# Patient Record
Sex: Male | Born: 1965 | Race: White | Hispanic: No | Marital: Single | Smoking: Former smoker
Health system: Southern US, Community
[De-identification: ages and names within clinical notes are randomized; demographics above are authoritative.]

## PROBLEM LIST (undated history)

## (undated) DIAGNOSIS — E785 Hyperlipidemia, unspecified: Secondary | ICD-10-CM

## (undated) DIAGNOSIS — J449 Chronic obstructive pulmonary disease, unspecified: Secondary | ICD-10-CM

## (undated) DIAGNOSIS — G4730 Sleep apnea, unspecified: Secondary | ICD-10-CM

## (undated) DIAGNOSIS — I219 Acute myocardial infarction, unspecified: Secondary | ICD-10-CM

## (undated) DIAGNOSIS — Z720 Tobacco use: Secondary | ICD-10-CM

## (undated) DIAGNOSIS — I509 Heart failure, unspecified: Secondary | ICD-10-CM

## (undated) DIAGNOSIS — I2510 Atherosclerotic heart disease of native coronary artery without angina pectoris: Secondary | ICD-10-CM

## (undated) HISTORY — PX: ESOPHAGOGASTRODUODENOSCOPY (EGD) WITH ESOPHAGEAL DILATION: SHX5812

## (undated) HISTORY — DX: Hyperlipidemia, unspecified: E78.5

## (undated) HISTORY — DX: Sleep apnea, unspecified: G47.30

## (undated) HISTORY — DX: Chronic obstructive pulmonary disease, unspecified: J44.9

## (undated) HISTORY — DX: Heart failure, unspecified: I50.9

## (undated) HISTORY — DX: Atherosclerotic heart disease of native coronary artery without angina pectoris: I25.10

---

## 2013-04-15 ENCOUNTER — Emergency Department (HOSPITAL_COMMUNITY)
Admission: EM | Admit: 2013-04-15 | Discharge: 2013-04-16 | Disposition: A | Discharge status: Home / Self Care | Payer: Self-pay | Attending: Emergency Medicine | Admitting: Emergency Medicine

## 2013-04-15 DIAGNOSIS — F1010 Alcohol abuse, uncomplicated: Secondary | ICD-10-CM | POA: Insufficient documentation
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
## 2013-04-16 ENCOUNTER — Emergency Department (HOSPITAL_COMMUNITY): Payer: Self-pay

## 2013-04-16 LAB — CBC WITH DIFFERENTIAL/PLATELET
Basophils Absolute: 0 10*3/uL (ref 0.0–0.1)
Eosinophils Absolute: 0 10*3/uL (ref 0.0–0.7)
Eosinophils Relative: 0 % (ref 0–5)
Lymphocytes Relative: 17 % (ref 12–46)
Lymphs Abs: 1.9 10*3/uL (ref 0.7–4.0)
MCV: 88.2 fL (ref 78.0–100.0)
Neutrophils Relative %: 79 % — ABNORMAL HIGH (ref 43–77)
Platelets: 237 10*3/uL (ref 150–400)
RBC: 4.84 MIL/uL (ref 4.22–5.81)
RDW: 13 % (ref 11.5–15.5)
WBC: 10.8 10*3/uL — ABNORMAL HIGH (ref 4.0–10.5)

## 2013-04-16 LAB — RAPID URINE DRUG SCREEN, HOSP PERFORMED
Benzodiazepines: NOT DETECTED
Cocaine: NOT DETECTED
Opiates: NOT DETECTED

## 2013-04-16 LAB — URINALYSIS, ROUTINE W REFLEX MICROSCOPIC
Bilirubin Urine: NEGATIVE
Ketones, ur: NEGATIVE mg/dL
Specific Gravity, Urine: >1.03 — ABNORMAL HIGH (ref 1.005–1.030)
pH: 5.5 (ref 5.0–8.0)

## 2013-04-16 LAB — URINE MICROSCOPIC-ADD ON

## 2013-04-16 LAB — BASIC METABOLIC PANEL
CO2: 24 mEq/L (ref 19–32)
Calcium: 8.9 mg/dL (ref 8.4–10.5)
GFR calc non Af Amer: >90 mL/min (ref >90)
Glucose, Bld: 132 mg/dL — ABNORMAL HIGH (ref 70–99)
Potassium: 3.4 mEq/L — ABNORMAL LOW (ref 3.5–5.1)
Sodium: 134 mEq/L — ABNORMAL LOW (ref 135–145)

## 2013-04-16 LAB — ETHANOL: Alcohol, Ethyl (B): 149 mg/dL — ABNORMAL HIGH (ref 0–11)

## 2013-04-16 MED ORDER — SODIUM CHLORIDE 0.9 % IV BOLUS (SEPSIS)
1000.0000 mL | Freq: Once | INTRAVENOUS | Status: AC
  Administered 2013-04-16: 1000 mL via INTRAVENOUS
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
## 2016-10-18 DIAGNOSIS — I219 Acute myocardial infarction, unspecified: Secondary | ICD-10-CM

## 2016-10-18 DIAGNOSIS — I639 Cerebral infarction, unspecified: Secondary | ICD-10-CM

## 2016-10-18 HISTORY — DX: Acute myocardial infarction, unspecified: I21.9

## 2016-10-18 HISTORY — DX: Cerebral infarction, unspecified: I63.9

## 2017-02-03 ENCOUNTER — Inpatient Hospital Stay (HOSPITAL_COMMUNITY)
Admission: EM | Admit: 2017-02-03 | Discharge: 2017-02-05 | DRG: 246 | Disposition: A | Discharge status: Home / Self Care | Payer: Self-pay | Attending: Internal Medicine | Admitting: Internal Medicine

## 2017-02-03 ENCOUNTER — Emergency Department (HOSPITAL_COMMUNITY): Payer: Self-pay

## 2017-02-03 ENCOUNTER — Encounter (HOSPITAL_COMMUNITY): Payer: Self-pay | Admitting: *Deleted

## 2017-02-03 DIAGNOSIS — Z880 Allergy status to penicillin: Secondary | ICD-10-CM

## 2017-02-03 DIAGNOSIS — Z720 Tobacco use: Secondary | ICD-10-CM | POA: Diagnosis present

## 2017-02-03 DIAGNOSIS — J441 Chronic obstructive pulmonary disease with (acute) exacerbation: Secondary | ICD-10-CM | POA: Diagnosis present

## 2017-02-03 DIAGNOSIS — F17210 Nicotine dependence, cigarettes, uncomplicated: Secondary | ICD-10-CM | POA: Diagnosis present

## 2017-02-03 DIAGNOSIS — R0789 Other chest pain: Secondary | ICD-10-CM

## 2017-02-03 DIAGNOSIS — E662 Morbid (severe) obesity with alveolar hypoventilation: Secondary | ICD-10-CM | POA: Diagnosis present

## 2017-02-03 DIAGNOSIS — J9621 Acute and chronic respiratory failure with hypoxia: Secondary | ICD-10-CM

## 2017-02-03 DIAGNOSIS — R079 Chest pain, unspecified: Secondary | ICD-10-CM | POA: Diagnosis present

## 2017-02-03 DIAGNOSIS — Z955 Presence of coronary angioplasty implant and graft: Secondary | ICD-10-CM

## 2017-02-03 DIAGNOSIS — R0902 Hypoxemia: Secondary | ICD-10-CM | POA: Diagnosis present

## 2017-02-03 DIAGNOSIS — I214 Non-ST elevation (NSTEMI) myocardial infarction: Principal | ICD-10-CM | POA: Diagnosis present

## 2017-02-03 DIAGNOSIS — I10 Essential (primary) hypertension: Secondary | ICD-10-CM | POA: Diagnosis present

## 2017-02-03 DIAGNOSIS — Z8249 Family history of ischemic heart disease and other diseases of the circulatory system: Secondary | ICD-10-CM

## 2017-02-03 DIAGNOSIS — Z6841 Body Mass Index (BMI) 40.0 and over, adult: Secondary | ICD-10-CM

## 2017-02-03 DIAGNOSIS — J9601 Acute respiratory failure with hypoxia: Secondary | ICD-10-CM | POA: Diagnosis present

## 2017-02-03 DIAGNOSIS — D72829 Elevated white blood cell count, unspecified: Secondary | ICD-10-CM | POA: Diagnosis present

## 2017-02-03 DIAGNOSIS — I252 Old myocardial infarction: Secondary | ICD-10-CM

## 2017-02-03 DIAGNOSIS — I2510 Atherosclerotic heart disease of native coronary artery without angina pectoris: Secondary | ICD-10-CM | POA: Diagnosis present

## 2017-02-03 DIAGNOSIS — E669 Obesity, unspecified: Secondary | ICD-10-CM

## 2017-02-03 HISTORY — DX: Tobacco use: Z72.0

## 2017-02-03 LAB — CBC
HCT: 48.8 % (ref 39.0–52.0)
HEMOGLOBIN: 16.2 g/dL (ref 13.0–17.0)
MCH: 29.8 pg (ref 26.0–34.0)
MCHC: 33.2 g/dL (ref 30.0–36.0)
MCV: 89.9 fL (ref 78.0–100.0)
PLATELETS: 270 10*3/uL (ref 150–400)
RBC: 5.43 MIL/uL (ref 4.22–5.81)
RDW: 13 % (ref 11.5–15.5)
WBC: 15 10*3/uL — AB (ref 4.0–10.5)

## 2017-02-03 LAB — APTT: APTT: 187 s — AB (ref 24–36)

## 2017-02-03 LAB — BASIC METABOLIC PANEL
Anion gap: 9 (ref 5–15)
BUN: 10 mg/dL (ref 6–20)
CALCIUM: 9.8 mg/dL (ref 8.9–10.3)
CO2: 27 mmol/L (ref 22–32)
CREATININE: 1.18 mg/dL (ref 0.61–1.24)
Chloride: 102 mmol/L (ref 101–111)
GFR calc Af Amer: >60 mL/min (ref >60)
GLUCOSE: 127 mg/dL — AB (ref 65–99)
POTASSIUM: 4.1 mmol/L (ref 3.5–5.1)
Sodium: 138 mmol/L (ref 135–145)

## 2017-02-03 LAB — HEPARIN LEVEL (UNFRACTIONATED): HEPARIN UNFRACTIONATED: 0.17 [IU]/mL — AB (ref 0.30–0.70)

## 2017-02-03 LAB — D-DIMER, QUANTITATIVE (NOT AT ARMC): D DIMER QUANT: 0.3 ug{FEU}/mL (ref 0.00–0.50)

## 2017-02-03 LAB — TROPONIN I
TROPONIN I: 0.05 ng/mL — AB (ref <0.03)
TROPONIN I: 0.33 ng/mL — AB (ref <0.03)
TROPONIN I: 0.74 ng/mL — AB (ref <0.03)

## 2017-02-03 MED ORDER — MORPHINE SULFATE (PF) 2 MG/ML IV SOLN: 2.0000 mg | INTRAVENOUS | Status: DC | PRN

## 2017-02-03 MED ORDER — ASPIRIN EC 325 MG PO TBEC
325.0000 mg | DELAYED_RELEASE_TABLET | Freq: Every day | ORAL | Status: DC
  Administered 2017-02-04: 325 mg via ORAL
  Filled 2017-02-03: qty 1

## 2017-02-03 MED ORDER — NITROGLYCERIN IN D5W 200-5 MCG/ML-% IV SOLN
5.0000 ug/min | Freq: Once | INTRAVENOUS | Status: AC
  Administered 2017-02-03: 5 ug/min via INTRAVENOUS
  Filled 2017-02-03: qty 250

## 2017-02-03 MED ORDER — HEPARIN BOLUS VIA INFUSION
4000.0000 [IU] | Freq: Once | INTRAVENOUS | Status: AC
  Administered 2017-02-03: 4000 [IU] via INTRAVENOUS

## 2017-02-03 MED ORDER — LEVOFLOXACIN IN D5W 500 MG/100ML IV SOLN
500.0000 mg | INTRAVENOUS | Status: DC
  Administered 2017-02-03 – 2017-02-04 (×2): 500 mg via INTRAVENOUS
  Filled 2017-02-03 (×3): qty 100

## 2017-02-03 MED ORDER — METHYLPREDNISOLONE SODIUM SUCC 125 MG IJ SOLR
125.0000 mg | Freq: Once | INTRAMUSCULAR | Status: AC
  Administered 2017-02-03: 125 mg via INTRAVENOUS
  Filled 2017-02-03: qty 2

## 2017-02-03 MED ORDER — IPRATROPIUM-ALBUTEROL 0.5-2.5 (3) MG/3ML IN SOLN: 3.0000 mL | RESPIRATORY_TRACT | Status: DC | PRN

## 2017-02-03 MED ORDER — IPRATROPIUM-ALBUTEROL 0.5-2.5 (3) MG/3ML IN SOLN
3.0000 mL | Freq: Once | RESPIRATORY_TRACT | Status: AC
  Administered 2017-02-03: 3 mL via RESPIRATORY_TRACT
  Filled 2017-02-03: qty 3

## 2017-02-03 MED ORDER — HEPARIN BOLUS VIA INFUSION
3000.0000 [IU] | Freq: Once | INTRAVENOUS | Status: AC
  Administered 2017-02-03: 3000 [IU] via INTRAVENOUS

## 2017-02-03 MED ORDER — GI COCKTAIL ~~LOC~~: 30.0000 mL | Freq: Four times a day (QID) | ORAL | Status: DC | PRN

## 2017-02-03 MED ORDER — NITROGLYCERIN 0.4 MG SL SUBL
0.4000 mg | SUBLINGUAL_TABLET | Freq: Once | SUBLINGUAL | Status: AC
  Administered 2017-02-03: 0.4 mg via SUBLINGUAL
  Filled 2017-02-03: qty 1

## 2017-02-03 MED ORDER — METHYLPREDNISOLONE SODIUM SUCC 125 MG IJ SOLR
60.0000 mg | Freq: Three times a day (TID) | INTRAMUSCULAR | Status: DC
  Administered 2017-02-04 (×2): 60 mg via INTRAVENOUS
  Filled 2017-02-03 (×2): qty 2

## 2017-02-03 MED ORDER — SODIUM CHLORIDE 0.9 % IV SOLN
INTRAVENOUS | Status: DC
  Administered 2017-02-04: 01:00:00 via INTRAVENOUS

## 2017-02-03 MED ORDER — HEPARIN (PORCINE) IN NACL 100-0.45 UNIT/ML-% IJ SOLN
2000.0000 [IU]/h | INTRAMUSCULAR | Status: DC
Start: 2017-02-03 — End: 2017-02-04
  Administered 2017-02-03: 1550 [IU]/h via INTRAVENOUS
  Administered 2017-02-04: 1850 [IU]/h via INTRAVENOUS
  Filled 2017-02-03: qty 250

## 2017-02-03 MED ORDER — ASPIRIN 81 MG PO CHEW
324.0000 mg | CHEWABLE_TABLET | Freq: Once | ORAL | Status: AC
Start: 2017-02-03 — End: 2017-02-03
  Administered 2017-02-03: 324 mg via ORAL
  Filled 2017-02-03: qty 4

## 2017-02-03 MED ORDER — ONDANSETRON HCL 4 MG/2ML IJ SOLN: 4.0000 mg | Freq: Four times a day (QID) | INTRAMUSCULAR | Status: DC | PRN

## 2017-02-03 MED ORDER — HEPARIN (PORCINE) IN NACL 100-0.45 UNIT/ML-% IJ SOLN
INTRAMUSCULAR | Status: AC
  Filled 2017-02-03: qty 250

## 2017-02-03 MED ORDER — GUAIFENESIN ER 600 MG PO TB12
600.0000 mg | ORAL_TABLET | Freq: Two times a day (BID) | ORAL | Status: DC
Start: 2017-02-03 — End: 2017-02-05
  Administered 2017-02-04 – 2017-02-05 (×4): 600 mg via ORAL
  Filled 2017-02-03 (×4): qty 1

## 2017-02-03 MED ORDER — NICOTINE 21 MG/24HR TD PT24
21.0000 mg | MEDICATED_PATCH | Freq: Every day | TRANSDERMAL | Status: DC | PRN
  Administered 2017-02-04: 21 mg via TRANSDERMAL
  Filled 2017-02-03: qty 1

## 2017-02-03 MED ORDER — ACETAMINOPHEN 325 MG PO TABS: 650.0000 mg | ORAL_TABLET | ORAL | Status: DC | PRN

## 2017-02-03 NOTE — ED Notes (Signed)
Dr. Katrinka Blazing made aware of pt's trop 0.33 at 1916, waiting to hear back from cardiologist that secretary paged

## 2017-02-03 NOTE — ED Notes (Signed)
Returned from xray

## 2017-02-03 NOTE — ED Triage Notes (Signed)
Cp in center of chest started around 1000 today, pt did report he had a pain in center of chest yesterday that lasted for a few seconds then went away.  The pain today is constant

## 2017-02-03 NOTE — Progress Notes (Signed)
ANTICOAGULATION CONSULT NOTE - Initial Consult  Pharmacy Consult for HEPARIN Indication: chest pain/ACS  Allergies  Allergen Reactions  . Penicillins Hives   Patient Measurements: Height: 6' (182.9 cm) Weight: (!) 305 lb (138.3 kg) IBW/kg (Calculated) : 77.6 HEPARIN DW (KG): 109.4  Vital Signs: Temp: 98.4 F (36.9 C) (04/19 1430) Temp Source: Oral (04/19 1430) BP: 134/84 (04/19 1500) Pulse Rate: 73 (04/19 1520)  Labs:  Recent Labs  02/03/17 1440  HGB 16.2  HCT 48.8  PLT 270  CREATININE 1.18  TROPONINI 0.05*   Estimated Creatinine Clearance: 107.9 mL/min (by C-G formula based on SCr of 1.18 mg/dL).  Medical History: History reviewed. No pertinent past medical history.  Medications:   (Not in a hospital admission)  Assessment: 50yo obese male c/o chest pain.  Asked to initiate Heparin for ACS.  Goal of Therapy:  Heparin level 0.3-0.7 units/ml Monitor platelets by anticoagulation protocol: Yes   Plan:   Heparin 4000 units IV now x 1  Heparin infusion at 1550 units/hr  Heparin level in 6-8 hrs then daily  CBC daily while on Heparin   Valrie Hart A 02/03/2017,3:56 PM

## 2017-02-03 NOTE — ED Notes (Signed)
Patient transported to X-ray 

## 2017-02-03 NOTE — ED Notes (Signed)
Pt's RA sats 86-89% while laying in bed at rest.  O2 applied via [place] at 2 L/M-sats increased to 94%

## 2017-02-03 NOTE — ED Notes (Signed)
CRITICAL VALUE ALERT  Critical value received:  Trop 0.05  Date of notification:  02/03/17  Time of notification:  1541  Critical value read back:Yes.    Nurse who received alert:  B. Garner Nash, RN  MD notified (1st page):  Rubin Payor  Time of first page:  [phone]

## 2017-02-03 NOTE — H&P (Addendum)
History and Physical    Chad Gillespie ZOX:[id number] DOB: 1965/12/24 DOA: 02/03/2017  Referring MD/NP/PA: Dr. Rubin Payor PCP: No PCP Per Patient  Patient coming from: Home  Chief Complaint: Chest pain  HPI: Chad Gillespie is a 51 y.o. male with medical history significant of tobacco abuse; who presents with complaints of chest pain since yesterday. Patient reports all hospitalization yesterday afternoon substernal chest pain that was sharp with radiation across his chest and into his back some. Associated symptoms included diaphoresis. At that time symptoms self resolved within less than 30 minutes. However, while at work today patient reported return of substernal chest pain. Symptoms were more severe and rates them a 8 ou of 10 on the pain scale at its worst. Symptoms seemed to wax and wane in intensity and have persisted multiple hours. Associated symptoms include shortness of breath, diaphoresis, productive cough (chronic), and intermittent leg swelling. He did not try anything for the symptoms. Denies any recent travel, prolonged immobilization, calf pain, abdominal pain, nausea, vomiting, orthopnea, fever, or chills. He does admit to smoking approximately up to 2 packs cigarettes per day on average. Patient is also needed by her primary care physician.   ED Course: Upon admission into the emergency department patient was seen to be afebrile, pulse 64-76, respirations 10-20, blood pressures 100/62-169/93, O2 saturations as low as 87% on room air. Labs revealed WBC 15, troponin 0.05, d-dimer 0.3, and other values relatively within normal limits. Chest x-ray was noted to be within normal limits. EKG showed no significant ischemic changes. Cardiology was consulted to evaluate the patient in the ED and he was placed on a heparin drip.   Review of Systems: As per HPI otherwise 10 point review of systems negative.   History reviewed. No pertinent past medical history.  Past Surgical History:    Procedure Laterality Date  . ESOPHAGOGASTRODUODENOSCOPY (EGD) WITH ESOPHAGEAL DILATION       reports that he has been smoking Cigarettes.  He has been smoking about 2.00 packs per day. He has never used smokeless tobacco. He reports that he drinks alcohol. He reports that he does not use drugs.  Allergies  Allergen Reactions  . Penicillins Hives    Has patient had a PCN reaction causing immediate rash, facial/tongue/throat swelling, SOB or lightheadedness with hypotension: Yes Has patient had a PCN reaction causing severe rash involving mucus membranes or skin necrosis: No Has patient had a PCN reaction that required hospitalization No Has patient had a PCN reaction occurring within the last 10 years: No If all of the above answers are "NO", then may proceed with Cephalosporin use.     No family history on file.  Prior to Admission medications   Not on File    Physical Exam: Constitutional: Obese male in moderate discomfort, but able to follow commands  Vitals:   02/03/17 1620 02/03/17 1626 02/03/17 1628 02/03/17 1630  BP: 118/76   128/72  Pulse: 71 73 70 73  Resp: 10 19 14 17   Temp:      TempSrc:      SpO2: 99% 99% 97% 94%  Weight:      Height:       Eyes: PERRL, lids and conjunctivae normal ENMT: Mucous membranes are moist. Posterior pharynx clear of any exudate or lesions.Normal dentition.  Neck: normal, supple, no masses, no thyromegaly Respiratory: Expiratory wheezes appreciated throughout both lung fields with decreased aeration. Patient on 2 L nasal cannula oxygen maintaining O2 saturation.  Normal respiratory effort. No accessory muscle  use.  Cardiovascular: Regular rate and rhythm, no murmurs / rubs / gallops. +1 b/l pitting lower extremity edema. 2+ pedal pulses. No carotid bruits.  Abdomen: no tenderness, no masses palpated. No hepatosplenomegaly. Bowel sounds positive.  Musculoskeletal: no clubbing / cyanosis. No joint deformity upper and lower extremities.  Good ROM, no contractures. Normal muscle tone.  Skin: no rashes, lesions, ulcers. No induration Neurologic: CN 2-12 grossly intact. Sensation intact, DTR normal. Strength 5/5 in all 4.  Psychiatric: Normal judgment and insight. Alert and oriented x 3. Normal mood.     Labs on Admission: I have personally reviewed following labs and imaging studies  CBC:  Recent Labs Lab 02/03/17 1440  WBC 15.0*  HGB 16.2  HCT 48.8  MCV 89.9  PLT 270   Basic Metabolic Panel:  Recent Labs Lab 02/03/17 1440  NA 138  K 4.1  CL 102  CO2 27  GLUCOSE 127*  BUN 10  CREATININE 1.18  CALCIUM 9.8   GFR: Estimated Creatinine Clearance: 107.9 mL/min (by C-G formula based on SCr of 1.18 mg/dL). Liver Function Tests: No results for input(s): AST, ALT, ALKPHOS, BILITOT, PROT, ALBUMIN in the last 168 hours. No results for input(s): LIPASE, AMYLASE in the last 168 hours. No results for input(s): AMMONIA in the last 168 hours. Coagulation Profile: No results for input(s): INR, PROTIME in the last 168 hours. Cardiac Enzymes:  Recent Labs Lab 02/03/17 1440  TROPONINI 0.05*   BNP (last 3 results) No results for input(s): PROBNP in the last 8760 hours. HbA1C: No results for input(s): HGBA1C in the last 72 hours. CBG: No results for input(s): GLUCAP in the last 168 hours. Lipid Profile: No results for input(s): CHOL, HDL, LDLCALC, TRIG, CHOLHDL, LDLDIRECT in the last 72 hours. Thyroid Function Tests: No results for input(s): TSH, T4TOTAL, FREET4, T3FREE, THYROIDAB in the last 72 hours. Anemia Panel: No results for input(s): VITAMINB12, FOLATE, FERRITIN, TIBC, IRON, RETICCTPCT in the last 72 hours. Urine analysis: No results found for: COLORURINE, APPEARANCEUR, LABSPEC, PHURINE, GLUCOSEU, HGBUR, BILIRUBINUR, KETONESUR, PROTEINUR, UROBILINOGEN, NITRITE, LEUKOCYTESUR Sepsis Labs: No results found for this or any previous visit (from the past 240 hour(s)).   Radiological Exams on Admission: Dg  Chest 2 View  Result Date: 02/03/2017 CLINICAL DATA:  Chest pain. EXAM: CHEST  2 VIEW COMPARISON:  None. FINDINGS: The heart size and mediastinal contours are within normal limits. Both lungs are clear. No pneumothorax or pleural effusion is noted. The visualized skeletal structures are unremarkable. IMPRESSION: No active cardiopulmonary disease. Electronically Signed   By: Lupita Raider, M.D.   On: 02/03/2017 15:31    EKG: Independently reviewed. Sinus rhythm   Assessment/Plan Chest pain with possible NSTEMI: Acute. Patient presents with reports of chest pain since yesterday. heart scores= to at least 4  Initial EKG without any significant ST elevations, but initial troponin 0.05. Suspect this is cardiac in nature, but could also be secondary to demand.  - Admit to stepdown bed - Heparin drip per pharmacy  - Trend cardiac enzymes - Check repaeat EKG in a.m. - NPO after midnight - Nitro / Morphine IV prn pain - ASA - check UDS and lipid panel - check Echo in a.m  - Appreciate Dr. Wyline Mood of Cardiology, follow-up for further recommendations   Leukocytosis: WBC elevated at 15. Could be reactive, but question possibility of underlying infection. - f/u repeat cbc in a.m.  Suspected COPD exacerbation: Acute. Patient without formal diagnosis of COPD. The patient reports more of a chronic  productive cough with 2ppd smoking history. Chest x-ray otherwise noted to be clear. - Duonebs prn SOB/ wheezing   - solumedrol IV q8 hours - Levaquin IV  Acute respiratory failure with Hypoxia: Oxygen saturations noted with low as 87% on room air.  - Continuous pulse oximetry with nasal cannula oxygen to keep O2 sats greater than 92%  Tobacco abuse - Counseled on need of cessation of tobacco - Nicotine patch offered  Obesity  DVT prophylaxis: Heparin drip  Code Status: Full Family Communication: Discussed plan of care with the patient's family present at bedside. Disposition Plan: likely discharge  home in 1-2 days  Consults called: Cardiology  Admission status: Observation  Clydie Braun MD [facility] Pager [phone]  If 7PM-7AM, please contact night-coverage [email or web address] Password [facility]  02/03/2017, 5:04 PM

## 2017-02-03 NOTE — Consult Note (Addendum)
Primary cardiologist: n/a Consulting cardiologist: Dr Dina Rich MD Requesting physician: Dr Benjiman Core Indication: chest pain  Clinical Summary Chad Gillespie is a 51 y.o.male no known PMH however has not seen doctors in several years presents with chest pain. Initial episode yesterday afternoon while watching tv. 5/10 sharp pain midchest, got diaphroetic. Lasted about 20 minutes then resolved. Repeat episode today around 10AM while at work. Similar sharp pain, associated diaphoresis and SOB. Constant pain x 6 hours, with some variability in severity. Currently pain free.    ER vitals: p 64 bp 169/93  87% RA K 4.1 Cr 1.18 WBC 15 Hgb 16.2 Plt 270 Trop 0.05 EKG SR, no ischemic changes CXR no acute process   Allergies  Allergen Reactions  . Penicillins Hives    Medications Scheduled Medications:    Infusions:    PRN Medications:     History reviewed. No pertinent past medical history.  Past Surgical History:  Procedure Laterality Date  . ESOPHAGOGASTRODUODENOSCOPY (EGD) WITH ESOPHAGEAL DILATION      No family history on file.  Social History [name] reports that he has been smoking Cigarettes.  He has been smoking about 2.00 packs per day. He has never used smokeless tobacco. [name] reports that he drinks alcohol.  Review of Systems CONSTITUTIONAL: No weight loss, fever, chills, weakness or fatigue.  HEENT: Eyes: No visual loss, blurred vision, double vision or yellow sclerae. No hearing loss, sneezing, congestion, runny nose or sore throat.  SKIN: No rash or itching.  CARDIOVASCULAR: per HPI RESPIRATORY: per HPI GASTROINTESTINAL: No anorexia, nausea, vomiting or diarrhea. No abdominal pain or blood.  GENITOURINARY: no polyuria, no dysuria NEUROLOGICAL: No headache, dizziness, syncope, paralysis, ataxia, numbness or tingling in the extremities. No change in bowel or bladder control.  MUSCULOSKELETAL: No muscle, back pain, joint pain or  stiffness.  HEMATOLOGIC: No anemia, bleeding or bruising.  LYMPHATICS: No enlarged nodes. No history of splenectomy.  PSYCHIATRIC: No history of depression or anxiety.      Physical Examination Blood pressure 134/84, pulse 73, temperature 98.4 F (36.9 C), temperature source Oral, resp. rate 13, height 6' (1.829 m), weight (!) 305 lb (138.3 kg), SpO2 92 %. No intake or output data in the 24 hours ending 02/03/17 1557  HEENT: sclera clear, throat clear  Cardiovascular: RRR, no m/r/g, no jvd  Respiratory: bilateral wheezing  GI: abdomen soft, NT, ND  MSK: trace bilateral LE edema  Neuro: no focal deficits  Psych: appropriate affect   Lab Results  Basic Metabolic Panel:  Recent Labs Lab 02/03/17 1440  NA 138  K 4.1  CL 102  CO2 27  GLUCOSE 127*  BUN 10  CREATININE 1.18  CALCIUM 9.8    Liver Function Tests: No results for input(s): AST, ALT, ALKPHOS, BILITOT, PROT, ALBUMIN in the last 168 hours.  CBC:  Recent Labs Lab 02/03/17 1440  WBC 15.0*  HGB 16.2  HCT 48.8  MCV 89.9  PLT 270    Cardiac Enzymes:  Recent Labs Lab 02/03/17 1440  TROPONINI 0.05*    BNP: Invalid input(s): POCBNP     Impression/Recommendations 1. Chest pain - symptoms not typical for angina. Primarily in there 6 hours constant duration. If prolonged ischemia would anticipate higher troponin as well. EKG shows no acute changes. - with his mild troponin, chest pain, and hypoxia recommend D-dimer. If positive would obtain CT PE - from cardiac standpoint cycle enzymes and EKG overnight, echo tomorrow AM. NPO at midnight - continue heparin  drip ,ASA - check FLP, HgbA1c with AM labs.   2. Wheezing - history of tobacco abuse. Significant wheezing on exam. Probable COPD exacerbation though patient has not had formal diagnosis in the past.  - likely will require nebs, perhaps course of steroids per primary team.   Dina Rich, M.D.

## 2017-02-03 NOTE — Progress Notes (Signed)
ANTICOAGULATION CONSULT NOTE - Initial Consult  Pharmacy Consult for HEPARIN Indication: chest pain/ACS  Allergies  Allergen Reactions  . Penicillins Hives    Has patient had a PCN reaction causing immediate rash, facial/tongue/throat swelling, SOB or lightheadedness with hypotension: Yes Has patient had a PCN reaction causing severe rash involving mucus membranes or skin necrosis: No Has patient had a PCN reaction that required hospitalization No Has patient had a PCN reaction occurring within the last 10 years: No If all of the above answers are "NO", then may proceed with Cephalosporin use.    Patient Measurements: Height: 6' (182.9 cm) Weight: (!) 305 lb (138.3 kg) IBW/kg (Calculated) : 77.6 HEPARIN DW (KG): 109.4  Vital Signs: Temp: 98.4 F (36.9 C) (04/19 1430) Temp Source: Oral (04/19 1430) BP: 132/67 (04/19 2140) Pulse Rate: 99 (04/19 2143)  Labs:  Recent Labs  02/03/17 1440 02/03/17 1616 02/03/17 1817 02/03/17 2111  HGB 16.2  --   --   --   HCT 48.8  --   --   --   PLT 270  --   --   --   APTT  --  187*  --   --   HEPARINUNFRC  --   --   --  0.17*  CREATININE 1.18  --   --   --   TROPONINI 0.05*  --  0.33* 0.74*   Estimated Creatinine Clearance: 107.9 mL/min (by C-G formula based on SCr of 1.18 mg/dL).  Medical History: History reviewed. No pertinent past medical history.  Medications:   (Not in a hospital admission)  Assessment: 50yo obese male c/o chest pain.  Asked to initiate Heparin for ACS. Heparin level is subtherapeutic at 0.17.   Goal of Therapy:  Heparin level 0.3-0.7 units/ml Monitor platelets by anticoagulation protocol: Yes   Plan:   Bolus Heparin 3000 units IV now x 1  Increase Heparin infusion at 1850 units/hr  Heparin level in 6-8 hrs then daily  CBC daily while on Heparin   Elder Cyphers, BS Loura Back, BCPS Clinical Pharmacist Pager [phone]  02/03/2017,10:12 PM

## 2017-02-03 NOTE — ED Notes (Signed)
Lab called concerning pt's PTT level 187, EDP made aware and asked to notify pharmacy, Chad Gillespie with pharmacy notified of level and said it was normal after heparin gtt started ( lab drawn after Heparin gtt started)

## 2017-02-03 NOTE — ED Notes (Signed)
Cardiologist in room at this time.

## 2017-02-03 NOTE — ED Provider Notes (Addendum)
[facility]-EM[facility]ENCY DEPT Provider Note   CSN: [id number] Arrival date & time: 02/03/17  1419     History   Chief Complaint Chief Complaint  Patient presents with  . Chest Pain    HPI Chad Gillespie is a 51 y.o. male.  HPI Patient presents with chest pain. Had an episode last around 20 minutes yesterday. Dull in his mid chest. At around 10 AM this morning he had another episode this been constant since then. He arrived at around 3:00. Dull. Slight shortness of breath with it. Has had some swelling in his legs for last month. No cough. Slight nausea. No fevers or chills. No Abdominal pain. He smokes. He is not however see a doctor. Still aching now. Pain is not changed with exertion. He has had a decreased appetite. States he just did not want to eat.  History reviewed. No pertinent past medical history.  There are no active problems to display for this patient.   Past Surgical History:  Procedure Laterality Date  . ESOPHAGOGASTRODUODENOSCOPY (EGD) WITH ESOPHAGEAL DILATION         Home Medications    Prior to Admission medications   Not on File    Family History No family history on file.  Social History Social History  Substance Use Topics  . Smoking status: Smoker, Current Status Unknown    Packs/day: 2.00    Types: Cigarettes  . Smokeless tobacco: Never Used  . Alcohol use Yes     Comment: occasionally      Allergies   Penicillins   Review of Systems Review of Systems  Constitutional: Negative for appetite change.  HENT: Negative for congestion.   Respiratory: Positive for cough and shortness of breath.   Cardiovascular: Positive for leg swelling.  Gastrointestinal: Negative for abdominal pain.  Endocrine: Negative for polyuria.  Genitourinary: Negative for flank pain.  Musculoskeletal: Negative for back pain.  Neurological: Negative for numbness.  Hematological: Negative for adenopathy.  Psychiatric/Behavioral: Negative for confusion.      Physical Exam Updated Vital Signs BP 134/84   Pulse 73   Temp 98.4 F (36.9 C) (Oral)   Resp 13   Ht 6' (1.829 m)   Wt (!) 305 lb (138.3 kg)   SpO2 92%   BMI 41.37 kg/m   Physical Exam  Constitutional: He appears well-developed.  HENT:  Head: Atraumatic.  Eyes: EOM are normal.  Neck: Neck supple.  Cardiovascular: Normal rate.   Pulmonary/Chest:  Mild diffuse harsh breath sounds.  Abdominal: Soft. There is no tenderness.  Musculoskeletal: He exhibits edema.  Mild bilateral lower extremity pitting edema.  Neurological: He is alert.  Skin: Skin is warm. Capillary refill takes less than 2 seconds.  Psychiatric: He has a normal mood and affect.     ED Treatments / Results  Labs (all labs ordered are listed, but only abnormal results are displayed) Labs Reviewed  BASIC METABOLIC PANEL - Abnormal; Notable for the following:       Result Value   Glucose, Bld 127 (*)    All other components within normal limits  CBC - Abnormal; Notable for the following:    WBC 15.0 (*)    All other components within normal limits  TROPONIN I - Abnormal; Notable for the following:    Troponin I 0.05 (*)    All other components within normal limits    EKG  EKG Interpretation  Date/Time:  Thursday February 03 2017 15:49:29 EDT Ventricular Rate:  74 PR Interval:  QRS Duration: 97 QT Interval:  375 QTC Calculation: 416 R Axis:   79 Text Interpretation:  Sinus rhythm Confirmed by Rubin Payor  MD, Juandedios Dudash [phone] on 02/03/2017 3:53:52 PM       Radiology Dg Chest 2 View  Result Date: 02/03/2017 CLINICAL DATA:  Chest pain. EXAM: CHEST  2 VIEW COMPARISON:  None. FINDINGS: The heart size and mediastinal contours are within normal limits. Both lungs are clear. No pneumothorax or pleural effusion is noted. The visualized skeletal structures are unremarkable. IMPRESSION: No active cardiopulmonary disease. Electronically Signed   By: Lupita Raider, M.D.   On: 02/03/2017 15:31     Procedures Procedures (including critical care time)  Medications Ordered in ED Medications  nitroGLYCERIN (NITROSTAT) SL tablet 0.4 mg (not administered)  aspirin chewable tablet 324 mg (324 mg Oral Given 02/03/17 1553)     Initial Impression / Assessment and Plan / ED Course  I have reviewed the triage vital signs and the nursing notes.  Pertinent labs & imaging results that were available during my care of the patient were reviewed by me and considered in my medical decision making (see chart for details).     Patient with chest pain. Began yesterday with one episode and had another episode today. Has had a little bit of hypoxia here with some wheezing. Negative d-dimer however troponin is 0.05. Seen by cardiology in the ER. Started on heparin and given aspirin after positive troponin. Subungual nitroglycerin relieved the patient's pain. It appears the plan will be for an echo tomorrow. Also has some wheezing. Albuterol given. Admit to internal medicine.  CRITICAL CARE Performed by: Billee Cashing Total critical care time: 30 minutes Critical care time was exclusive of separately billable procedures and treating other patients. Critical care was necessary to treat or prevent imminent or life-threatening deterioration. Critical care was time spent personally by me on the following activities: development of treatment plan with patient and/or surrogate as well as nursing, discussions with consultants, evaluation of patient's response to treatment, examination of patient, obtaining history from patient or surrogate, ordering and performing treatments and interventions, ordering and review of laboratory studies, ordering and review of radiographic studies, pulse oximetry and re-evaluation of patient's condition.  Final Clinical Impressions(s) / ED Diagnoses   Final diagnoses:  None    New Prescriptions New Prescriptions   No medications on file     Benjiman Core,  MD 02/03/17 1657  Called by Dr. Katrinka Blazing. Troponin is increasing. Will transfer to [facility].    Benjiman Core, MD 02/03/17 2001

## 2017-02-04 ENCOUNTER — Encounter (HOSPITAL_COMMUNITY)
Admission: EM | Disposition: A | Discharge status: Home / Self Care | Payer: Self-pay | Source: Home / Self Care | Attending: Internal Medicine

## 2017-02-04 ENCOUNTER — Encounter (HOSPITAL_COMMUNITY): Payer: Self-pay | Admitting: Physician Assistant

## 2017-02-04 ENCOUNTER — Other Ambulatory Visit: Payer: Self-pay

## 2017-02-04 ENCOUNTER — Inpatient Hospital Stay (HOSPITAL_COMMUNITY): Payer: Self-pay

## 2017-02-04 ENCOUNTER — Other Ambulatory Visit (HOSPITAL_COMMUNITY): Payer: Self-pay

## 2017-02-04 DIAGNOSIS — R079 Chest pain, unspecified: Secondary | ICD-10-CM

## 2017-02-04 DIAGNOSIS — I2510 Atherosclerotic heart disease of native coronary artery without angina pectoris: Secondary | ICD-10-CM

## 2017-02-04 DIAGNOSIS — I214 Non-ST elevation (NSTEMI) myocardial infarction: Principal | ICD-10-CM

## 2017-02-04 HISTORY — PX: LEFT HEART CATH AND CORONARY ANGIOGRAPHY: CATH118249

## 2017-02-04 HISTORY — PX: CORONARY STENT INTERVENTION: CATH118234

## 2017-02-04 LAB — CBC
HEMATOCRIT: 44.6 % (ref 39.0–52.0)
HEMATOCRIT: 43.4 % (ref 39.0–52.0)
Hemoglobin: 14.5 g/dL (ref 13.0–17.0)
Hemoglobin: 14.4 g/dL (ref 13.0–17.0)
MCH: 29.2 pg (ref 26.0–34.0)
MCH: 29.8 pg (ref 26.0–34.0)
MCHC: 32.5 g/dL (ref 30.0–36.0)
MCHC: 33.2 g/dL (ref 30.0–36.0)
MCV: 89.7 fL (ref 78.0–100.0)
MCV: 89.7 fL (ref 78.0–100.0)
PLATELETS: 273 10*3/uL (ref 150–400)
PLATELETS: 265 10*3/uL (ref 150–400)
RBC: 4.97 MIL/uL (ref 4.22–5.81)
RBC: 4.84 MIL/uL (ref 4.22–5.81)
RDW: 12.9 % (ref 11.5–15.5)
RDW: 13 % (ref 11.5–15.5)
WBC: 15.8 10*3/uL — ABNORMAL HIGH (ref 4.0–10.5)
WBC: 22.4 10*3/uL — ABNORMAL HIGH (ref 4.0–10.5)

## 2017-02-04 LAB — POCT ACTIVATED CLOTTING TIME
Activated Clotting Time: 268 seconds
Activated Clotting Time: 301 seconds

## 2017-02-04 LAB — RAPID URINE DRUG SCREEN, HOSP PERFORMED
Amphetamines: NOT DETECTED
BARBITURATES: NOT DETECTED
BENZODIAZEPINES: NOT DETECTED
COCAINE: NOT DETECTED
OPIATES: POSITIVE — AB
Tetrahydrocannabinol: NOT DETECTED

## 2017-02-04 LAB — DIFFERENTIAL
BASOS ABS: 0 10*3/uL (ref 0.0–0.1)
Basophils Relative: 0 %
Eosinophils Absolute: 0.1 10*3/uL (ref 0.0–0.7)
Eosinophils Relative: 0 %
Lymphocytes Relative: 8 %
Lymphs Abs: 1.3 10*3/uL (ref 0.7–4.0)
Monocytes Absolute: 0.1 10*3/uL (ref 0.1–1.0)
Monocytes Relative: 1 %
NEUTROS PCT: 91 %
Neutro Abs: 14.8 10*3/uL — ABNORMAL HIGH (ref 1.7–7.7)

## 2017-02-04 LAB — LIPID PANEL
CHOL/HDL RATIO: 5.3 ratio
Cholesterol: 191 mg/dL (ref 0–200)
HDL: 36 mg/dL — ABNORMAL LOW (ref >40)
LDL CALC: 138 mg/dL — AB (ref 0–99)
Triglycerides: 83 mg/dL (ref <150)
VLDL: 17 mg/dL (ref 0–40)

## 2017-02-04 LAB — ECHOCARDIOGRAM COMPLETE
Height: 73 in
Weight: 4820.8 oz

## 2017-02-04 LAB — HEPARIN LEVEL (UNFRACTIONATED): HEPARIN UNFRACTIONATED: 0.28 [IU]/mL — AB (ref 0.30–0.70)

## 2017-02-04 LAB — CREATININE, SERUM: Creatinine, Ser: 1.16 mg/dL (ref 0.61–1.24)

## 2017-02-04 LAB — PROTIME-INR
INR: 1.02
PROTHROMBIN TIME: 13.4 s (ref 11.4–15.2)

## 2017-02-04 LAB — MRSA PCR SCREENING: MRSA BY PCR: NEGATIVE

## 2017-02-04 SURGERY — LEFT HEART CATH AND CORONARY ANGIOGRAPHY
Anesthesia: LOCAL

## 2017-02-04 MED ORDER — TICAGRELOR 90 MG PO TABS
90.0000 mg | ORAL_TABLET | Freq: Two times a day (BID) | ORAL | Status: DC
  Administered 2017-02-04 – 2017-02-05 (×2): 90 mg via ORAL
  Filled 2017-02-04 (×2): qty 1

## 2017-02-04 MED ORDER — IOPAMIDOL (ISOVUE-370) INJECTION 76%
INTRAVENOUS | Status: DC | PRN
  Administered 2017-02-04: 190 mL via INTRA_ARTERIAL

## 2017-02-04 MED ORDER — SODIUM CHLORIDE 0.9% FLUSH: 3.0000 mL | INTRAVENOUS | Status: DC | PRN

## 2017-02-04 MED ORDER — MIDAZOLAM HCL 2 MG/2ML IJ SOLN
INTRAMUSCULAR | Status: DC | PRN
  Administered 2017-02-04: 0.5 mg via INTRAVENOUS

## 2017-02-04 MED ORDER — VERAPAMIL HCL 2.5 MG/ML IV SOLN
INTRAVENOUS | Status: DC | PRN
  Administered 2017-02-04: 10 mL via INTRA_ARTERIAL

## 2017-02-04 MED ORDER — TICAGRELOR 90 MG PO TABS
ORAL_TABLET | ORAL | Status: DC | PRN
  Administered 2017-02-04: 180 mg via ORAL

## 2017-02-04 MED ORDER — NITROGLYCERIN 1 MG/10 ML FOR IR/CATH LAB
INTRA_ARTERIAL | Status: DC | PRN
  Administered 2017-02-04: 200 ug via INTRACORONARY

## 2017-02-04 MED ORDER — ATORVASTATIN CALCIUM 80 MG PO TABS
80.0000 mg | ORAL_TABLET | Freq: Every day | ORAL | Status: DC
  Administered 2017-02-04: 80 mg via ORAL
  Filled 2017-02-04: qty 1

## 2017-02-04 MED ORDER — HEPARIN SODIUM (PORCINE) 5000 UNIT/ML IJ SOLN
5000.0000 [IU] | Freq: Three times a day (TID) | INTRAMUSCULAR | Status: DC
  Administered 2017-02-04 – 2017-02-05 (×2): 5000 [IU] via SUBCUTANEOUS
  Filled 2017-02-04 (×2): qty 1

## 2017-02-04 MED ORDER — HEPARIN (PORCINE) IN NACL 2-0.9 UNIT/ML-% IJ SOLN
INTRAMUSCULAR | Status: AC
  Filled 2017-02-04: qty 1000

## 2017-02-04 MED ORDER — IOPAMIDOL (ISOVUE-370) INJECTION 76%
INTRAVENOUS | Status: AC
  Filled 2017-02-04: qty 100

## 2017-02-04 MED ORDER — SODIUM CHLORIDE 0.9 % IV SOLN
INTRAVENOUS | Status: AC
  Administered 2017-02-04: 15:00:00 via INTRAVENOUS

## 2017-02-04 MED ORDER — SODIUM CHLORIDE 0.9% FLUSH: 3.0000 mL | Freq: Two times a day (BID) | INTRAVENOUS | Status: DC

## 2017-02-04 MED ORDER — LIDOCAINE HCL (PF) 1 % IJ SOLN
INTRAMUSCULAR | Status: DC | PRN
  Administered 2017-02-04: 2 mL

## 2017-02-04 MED ORDER — TICAGRELOR 90 MG PO TABS
ORAL_TABLET | ORAL | Status: AC
  Filled 2017-02-04: qty 2

## 2017-02-04 MED ORDER — VERAPAMIL HCL 2.5 MG/ML IV SOLN
INTRAVENOUS | Status: AC
  Filled 2017-02-04: qty 2

## 2017-02-04 MED ORDER — ASPIRIN 81 MG PO CHEW
81.0000 mg | CHEWABLE_TABLET | Freq: Every day | ORAL | Status: DC
  Administered 2017-02-05: 81 mg via ORAL
  Filled 2017-02-04: qty 1

## 2017-02-04 MED ORDER — SODIUM CHLORIDE 0.9 % IV SOLN
INTRAVENOUS | Status: DC
  Administered 2017-02-04: 10:00:00 via INTRAVENOUS

## 2017-02-04 MED ORDER — ASPIRIN 81 MG PO CHEW
81.0000 mg | CHEWABLE_TABLET | ORAL | Status: AC
  Administered 2017-02-04: 81 mg via ORAL

## 2017-02-04 MED ORDER — SODIUM CHLORIDE 0.9 % IV SOLN: 250.0000 mL | INTRAVENOUS | Status: DC | PRN

## 2017-02-04 MED ORDER — PERFLUTREN LIPID MICROSPHERE
1.0000 mL | INTRAVENOUS | Status: AC | PRN
  Administered 2017-02-04: 2 mL via INTRAVENOUS
  Filled 2017-02-04: qty 10

## 2017-02-04 MED ORDER — HEPARIN SODIUM (PORCINE) 1000 UNIT/ML IJ SOLN
INTRAMUSCULAR | Status: DC | PRN
  Administered 2017-02-04 (×2): 6000 [IU] via INTRAVENOUS

## 2017-02-04 MED ORDER — LORAZEPAM 0.5 MG PO TABS
0.5000 mg | ORAL_TABLET | Freq: Once | ORAL | Status: AC
  Administered 2017-02-04: 0.5 mg via ORAL
  Filled 2017-02-04: qty 1

## 2017-02-04 MED ORDER — HEPARIN SODIUM (PORCINE) 1000 UNIT/ML IJ SOLN
INTRAMUSCULAR | Status: AC
  Filled 2017-02-04: qty 1

## 2017-02-04 MED ORDER — SODIUM CHLORIDE 0.9% FLUSH
3.0000 mL | Freq: Two times a day (BID) | INTRAVENOUS | Status: DC
  Administered 2017-02-04 – 2017-02-05 (×2): 3 mL via INTRAVENOUS

## 2017-02-04 MED ORDER — METHYLPREDNISOLONE SODIUM SUCC 125 MG IJ SOLR
40.0000 mg | Freq: Three times a day (TID) | INTRAMUSCULAR | Status: DC
  Administered 2017-02-04 – 2017-02-05 (×3): 40 mg via INTRAVENOUS
  Filled 2017-02-04 (×3): qty 2

## 2017-02-04 MED ORDER — NITROGLYCERIN 1 MG/10 ML FOR IR/CATH LAB
INTRA_ARTERIAL | Status: AC
  Filled 2017-02-04: qty 10

## 2017-02-04 MED ORDER — MIDAZOLAM HCL 2 MG/2ML IJ SOLN
INTRAMUSCULAR | Status: AC
  Filled 2017-02-04: qty 2

## 2017-02-04 MED ORDER — LIDOCAINE HCL 1 % IJ SOLN
INTRAMUSCULAR | Status: AC
  Filled 2017-02-04: qty 20

## 2017-02-04 MED ORDER — IOPAMIDOL (ISOVUE-370) INJECTION 76%
INTRAVENOUS | Status: AC
  Filled 2017-02-04: qty 50

## 2017-02-04 MED ORDER — HEPARIN (PORCINE) IN NACL 2-0.9 UNIT/ML-% IJ SOLN
INTRAMUSCULAR | Status: DC | PRN
  Administered 2017-02-04: 1000 mL

## 2017-02-04 SURGICAL SUPPLY — 24 items
BALLN EMERGE MR 2.5X12 (BALLOONS) ×2
BALLN ~~LOC~~ EMERGE MR 3.25X12 (BALLOONS) ×2
BALLN ~~LOC~~ EMERGE MR 3.75X12 (BALLOONS) ×2
BALLOON EMERGE MR 2.5X12 (BALLOONS) ×1 IMPLANT
BALLOON ~~LOC~~ EMERGE MR 3.25X12 (BALLOONS) ×1 IMPLANT
BALLOON ~~LOC~~ EMERGE MR 3.75X12 (BALLOONS) ×1 IMPLANT
CATH 5FR JL3.5 JR4 ANG PIG MP (CATHETERS) ×2 IMPLANT
CATH EXPO 5FR FL4 (CATHETERS) ×2 IMPLANT
CATH INFINITI 5FR AL1 (CATHETERS) ×2 IMPLANT
CATH LAUNCHER 5F RADR (CATHETERS) ×1 IMPLANT
CATHETER LAUNCHER 5F RADR (CATHETERS) ×2
DEVICE RAD COMP TR BAND LRG (VASCULAR PRODUCTS) ×2 IMPLANT
GLIDESHEATH SLEND SS 6F .021 (SHEATH) ×2 IMPLANT
GUIDEWIRE INQWIRE 1.5J.035X260 (WIRE) ×1 IMPLANT
INQWIRE 1.5J .035X260CM (WIRE) ×2
KIT ENCORE 26 ADVANTAGE (KITS) ×2 IMPLANT
KIT HEART LEFT (KITS) ×2 IMPLANT
PACK CARDIAC CATHETERIZATION (CUSTOM PROCEDURE TRAY) ×2 IMPLANT
STENT PROMUS PREM MR 3.0X20 (Permanent Stent) ×2 IMPLANT
STENT PROMUS PREM MR 3.5X16 (Permanent Stent) ×2 IMPLANT
SYR MEDRAD MARK V 150ML (SYRINGE) ×2 IMPLANT
TRANSDUCER W/STOPCOCK (MISCELLANEOUS) ×2 IMPLANT
TUBING CIL FLEX 10 FLL-RA (TUBING) ×2 IMPLANT
WIRE ASAHI PROWATER 180CM (WIRE) ×2 IMPLANT

## 2017-02-04 NOTE — Progress Notes (Signed)
  Echocardiogram 2D Echocardiogram with Definity has been performed.  Nolon Rod 02/04/2017, 3:51 PM

## 2017-02-04 NOTE — Progress Notes (Signed)
ANTICOAGULATION CONSULT NOTE - Follow Up Consult  Pharmacy Consult for heparin Indication: chest pain/ACS   Labs:  Recent Labs  02/03/17 1440 02/03/17 1616 02/03/17 1817 02/03/17 2111 02/04/17 0501  HGB 16.2  --   --   --  14.5  HCT 48.8  --   --   --  44.6  PLT 270  --   --   --  273  APTT  --  187*  --   --   --   HEPARINUNFRC  --   --   --  0.17* 0.28*  CREATININE 1.18  --   --   --   --   TROPONINI 0.05*  --  0.33* 0.74*  --      Assessment: 51yo male remains slightly subtherapeutic on heparin after rate change.  Goal of Therapy:  Heparin level 0.3-0.7 units/ml   Plan:  Will increase heparin gtt by 1 unit/kg/hr to 2000 units/hr and check level in 6hr.  Vernard Gambles, PharmD, BCPS  02/04/2017,6:28 AM

## 2017-02-04 NOTE — Progress Notes (Signed)
Clipped right wrist and right  Groin, chlorahexidine wash followed. Consent and pre procedure checklist signed.

## 2017-02-04 NOTE — Progress Notes (Signed)
Returned from cath lab. Right radial site clean dry, tr band on, moving fingers good sensation. Denies chest pain or nausea. Taking sips of liquids and tolerating, cardiac diet ordered.

## 2017-02-04 NOTE — Plan of Care (Signed)
Problem: Health Behavior/Discharge Planning: Goal: Ability to manage health-related needs will improve Outcome: Progressing Information given on diet, exercise and smoking cessation  Comments: Post cath procedure, 2 stents. Progressing in mobility plan on discharging in AM

## 2017-02-04 NOTE — H&P (View-Only) (Signed)
Patient Name: Chad Gillespie Date of Encounter: 02/04/2017  Primary Cardiologist: New- Dr. Wyline Mood ([place])  [facility] Problem List     Principal Problem:   NSTEMI (non-ST elevated myocardial infarction) [facility]) Active Problems:   Chest pain   Tobacco abuse   Obesity   Leukocytosis   Hypoxia     Subjective   No  More chest pain since last night around 1 am. Felt like a sharp central chest pressure. Breathing okay now. No complaints.  Inpatient Medications    Scheduled Meds: . aspirin EC  325 mg Oral Daily  . guaiFENesin  600 mg Oral BID  . methylPREDNISolone (SOLU-MEDROL) injection  60 mg Intravenous Q8H   Continuous Infusions: . sodium chloride 75 mL/hr at 02/04/17 0035  . heparin 1,850 Units/hr (02/04/17 0036)  . levofloxacin (LEVAQUIN) IV Stopped (02/03/17 2025)   PRN Meds: acetaminophen, gi cocktail, ipratropium-albuterol, morphine injection, nicotine, ondansetron (ZOFRAN) IV   Vital Signs    Vitals:   02/04/17 0100 02/04/17 0300 02/04/17 0400 02/04/17 0500  BP: 120/75  125/69   Pulse: 84 80 78 89  Resp: (!) 21 18 14 16   Temp:   98.9 F (37.2 C)   TempSrc:   Oral   SpO2: 90% (!) 88% 90% 92%  Weight:      Height:        Intake/Output Summary (Last 24 hours) at 02/04/17 0644 Last data filed at 02/04/17 0500  Gross per 24 hour  Intake              588 ml  Output             1050 ml  Net             -462 ml   Filed Weights   02/03/17 1432 02/03/17 2238  Weight: (!) 305 lb (138.3 kg) (!) 301 lb 4.8 oz (136.7 kg)    Physical Exam   GEN: Well nourished, well developed, in no acute distress. Morbidly obese, disheveled appearing HEENT: Grossly normal.  Neck: Supple, no JVD, carotid bruits, or masses. Cardiac: RRR, no murmurs, rubs, or gallops. No clubbing, cyanosis, trace bilateral LE edema.  Radials/DP/PT 2+ and equal bilaterally.  Respiratory:  Respirations regular and unlabored, mild wheezing GI: Soft, nontender, nondistended, BS + x 4. MS: no deformity  or atrophy. Skin: warm and dry, no rash. Neuro:  Strength and sensation are intact. Psych: AAOx3.  Normal affect.  Labs    CBC  Recent Labs  02/03/17 1440 02/04/17 0501  WBC 15.0* 15.8*  HGB 16.2 14.5  HCT 48.8 44.6  MCV 89.9 89.7  PLT 270 273   Basic Metabolic Panel  Recent Labs  02/03/17 1440  NA 138  K 4.1  CL 102  CO2 27  GLUCOSE 127*  BUN 10  CREATININE 1.18  CALCIUM 9.8   Liver Function Tests No results for input(s): AST, ALT, ALKPHOS, BILITOT, PROT, ALBUMIN in the last 72 hours. No results for input(s): LIPASE, AMYLASE in the last 72 hours. Cardiac Enzymes  Recent Labs  02/03/17 1440 02/03/17 1817 02/03/17 2111  TROPONINI 0.05* 0.33* 0.74*   BNP Invalid input(s): POCBNP D-Dimer  Recent Labs  02/03/17 1616  DDIMER 0.30   Hemoglobin A1C No results for input(s): HGBA1C in the last 72 hours. Fasting Lipid Panel No results for input(s): CHOL, HDL, LDLCALC, TRIG, CHOLHDL, LDLDIRECT in the last 72 hours. Thyroid Function Tests No results for input(s): TSH, T4TOTAL, T3FREE, THYROIDAB in the last 72 hours.  Invalid  input(s): FREET3  Telemetry    NSR with some sinus tachy - Personally Reviewed  ECG     NSR - Personally Reviewed  Radiology    Dg Chest 2 View  Result Date: 02/03/2017 CLINICAL DATA:  Chest pain. EXAM: CHEST  2 VIEW COMPARISON:  None. FINDINGS: The heart size and mediastinal contours are within normal limits. Both lungs are clear. No pneumothorax or pleural effusion is noted. The visualized skeletal structures are unremarkable. IMPRESSION: No active cardiopulmonary disease. Electronically Signed   By: Lupita Raider, M.D.   On: 02/03/2017 15:31    Cardiac Studies   none  Patient Profile     Chad Gillespie is a 51 y.o. male with a history of tobacco abuse with likely undiagnosed COPD, morbid obesity and no other known past medical history (by virtue of not seeing a medical doctor in many years) who presented to [facility] on  01/31/17 with chest pain. He ruled in for NSTEMI and transferred to [facility] for further care.    Assessment & Plan    NSTEMI: troponin 0.05--> 0.33--> 0.74. Continue IV heparin and nitro gtt. DDimer negative. ECG without acute ST or TW changes. Could be demand in the setting in AECOPD but trend could also represent ACS. He does have RF for CAD including obesity, heavy smoking, family history (father had first MI in 53s s/p CABG), likely untreated HTN. 2D ECHO ordered but not completed yet. I have ordered a lipid panel. I suspect that he will need a heart cath. He is NPO. Dr. Glee Arvin to follow .   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   Leukocytosis: likely reactive, but continue to monitor for infectious causes  Suspected HTN: improved on nitro gtt.     Signed, Cline Crock, PA-C  02/04/2017, 6:44 AM   I have seen and examined the patient along with Cline Crock, PA-C .  I have reviewed the chart, notes and new data.  I agree with PA's note.  Key new complaints: no chest discomfort at this time Key examination changes: no overt CHF Key new findings / data: ECG suggests possible old completed inferior MI, troponin mildly abnormal.  PLAN: Agree next best step is coronary angiography, PCI-stent if needed. This procedure has been fully reviewed with the patient and written informed consent has been obtained. Smoking cessation, weight loss discussed. Statin. Check lipid profile.  Thurmon Fair, MD, [facility] [phone] 02/04/2017, 8:52 AM

## 2017-02-04 NOTE — Progress Notes (Signed)
Patient Name: Chad Gillespie Date of Encounter: 02/04/2017  Primary Cardiologist: New- Dr. Wyline Mood ([facility])  [facility] Problem List     Principal Problem:   NSTEMI (non-ST elevated myocardial infarction) [facility]) Active Problems:   Chest pain   Tobacco abuse   Obesity   Leukocytosis   Hypoxia     Subjective   No  More chest pain since last night around 1 am. Felt like a sharp central chest pressure. Breathing okay now. No complaints.  Inpatient Medications    Scheduled Meds: . aspirin EC  325 mg Oral Daily  . guaiFENesin  600 mg Oral BID  . methylPREDNISolone (SOLU-MEDROL) injection  60 mg Intravenous Q8H   Continuous Infusions: . sodium chloride 75 mL/hr at 02/04/17 0035  . heparin 1,850 Units/hr (02/04/17 0036)  . levofloxacin (LEVAQUIN) IV Stopped (02/03/17 2025)   PRN Meds: acetaminophen, gi cocktail, ipratropium-albuterol, morphine injection, nicotine, ondansetron (ZOFRAN) IV   Vital Signs    Vitals:   02/04/17 0100 02/04/17 0300 02/04/17 0400 02/04/17 0500  BP: 120/75  125/69   Pulse: 84 80 78 89  Resp: (!) 21 18 14 16   Temp:   98.9 F (37.2 C)   TempSrc:   Oral   SpO2: 90% (!) 88% 90% 92%  Weight:      Height:        Intake/Output Summary (Last 24 hours) at 02/04/17 0644 Last data filed at 02/04/17 0500  Gross per 24 hour  Intake              588 ml  Output             1050 ml  Net             -462 ml   Filed Weights   02/03/17 1432 02/03/17 2238  Weight: (!) 305 lb (138.3 kg) (!) 301 lb 4.8 oz (136.7 kg)    Physical Exam   GEN: Well nourished, well developed, in no acute distress. Morbidly obese, disheveled appearing HEENT: Grossly normal.  Neck: Supple, no JVD, carotid bruits, or masses. Cardiac: RRR, no murmurs, rubs, or gallops. No clubbing, cyanosis, trace bilateral LE edema.  Radials/DP/PT 2+ and equal bilaterally.  Respiratory:  Respirations regular and unlabored, mild wheezing GI: Soft, nontender, nondistended, BS + x 4. MS: no deformity  or atrophy. Skin: warm and dry, no rash. Neuro:  Strength and sensation are intact. Psych: AAOx3.  Normal affect.  Labs    CBC  Recent Labs  02/03/17 1440 02/04/17 0501  WBC 15.0* 15.8*  HGB 16.2 14.5  HCT 48.8 44.6  MCV 89.9 89.7  PLT 270 273   Basic Metabolic Panel  Recent Labs  02/03/17 1440  NA 138  K 4.1  CL 102  CO2 27  GLUCOSE 127*  BUN 10  CREATININE 1.18  CALCIUM 9.8   Liver Function Tests No results for input(s): AST, ALT, ALKPHOS, BILITOT, PROT, ALBUMIN in the last 72 hours. No results for input(s): LIPASE, AMYLASE in the last 72 hours. Cardiac Enzymes  Recent Labs  02/03/17 1440 02/03/17 1817 02/03/17 2111  TROPONINI 0.05* 0.33* 0.74*   BNP Invalid input(s): POCBNP D-Dimer  Recent Labs  02/03/17 1616  DDIMER 0.30   Hemoglobin A1C No results for input(s): HGBA1C in the last 72 hours. Fasting Lipid Panel No results for input(s): CHOL, HDL, LDLCALC, TRIG, CHOLHDL, LDLDIRECT in the last 72 hours. Thyroid Function Tests No results for input(s): TSH, T4TOTAL, T3FREE, THYROIDAB in the last 72 hours.  Invalid  input(s): FREET3  Telemetry    NSR with some sinus tachy - Personally Reviewed  ECG     NSR - Personally Reviewed  Radiology    Dg Chest 2 View  Result Date: 02/03/2017 CLINICAL DATA:  Chest pain. EXAM: CHEST  2 VIEW COMPARISON:  None. FINDINGS: The heart size and mediastinal contours are within normal limits. Both lungs are clear. No pneumothorax or pleural effusion is noted. The visualized skeletal structures are unremarkable. IMPRESSION: No active cardiopulmonary disease. Electronically Signed   By: Lupita Raider, M.D.   On: 02/03/2017 15:31    Cardiac Studies   none  Patient Profile     Chad Gillespie is a 51 y.o. male with a history of tobacco abuse with likely undiagnosed COPD, morbid obesity and no other known past medical history (by virtue of not seeing a medical doctor in many years) who presented to [facility] on  01/31/17 with chest pain. He ruled in for NSTEMI and transferred to [facility] for further care.    Assessment & Plan    NSTEMI: troponin 0.05--> 0.33--> 0.74. Continue IV heparin and nitro gtt. DDimer negative. ECG without acute ST or TW changes. Could be demand in the setting in AECOPD but trend could also represent ACS. He does have RF for CAD including obesity, heavy smoking, family history (father had first MI in 53s s/p CABG), likely untreated HTN. 2D ECHO ordered but not completed yet. I have ordered a lipid panel. I suspect that he will need a heart cath. He is NPO. Dr. Glee Arvin to follow .   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   Leukocytosis: likely reactive, but continue to monitor for infectious causes  Suspected HTN: improved on nitro gtt.     Signed, Cline Crock, PA-C  02/04/2017, 6:44 AM   I have seen and examined the patient along with Cline Crock, PA-C .  I have reviewed the chart, notes and new data.  I agree with PA's note.  Key new complaints: no chest discomfort at this time Key examination changes: no overt CHF Key new findings / data: ECG suggests possible old completed inferior MI, troponin mildly abnormal.  PLAN: Agree next best step is coronary angiography, PCI-stent if needed. This procedure has been fully reviewed with the patient and written informed consent has been obtained. Smoking cessation, weight loss discussed. Statin. Check lipid profile.  Thurmon Fair, MD, [facility] [phone] 02/04/2017, 8:52 AM

## 2017-02-04 NOTE — Care Management Note (Signed)
Case Management Note  Patient Details  Name: Chad Gillespie MRN: [id number] Date of Birth: July 16, 1966  Subjective/Objective:  Pt admitted with NSTEMI                  Action/Plan:   PTA independent from home with wife.  Pt states he is willing to travel to [facility] for PCP and medication assistance.  CM provided [facility] information to pt and on AVS.  If he discharges home over weekend may need MATCH   Expected Discharge Date:                  Expected Discharge Plan:  Home/Self Care  In-House Referral:     Discharge planning Services  CM Consult, [facility]igent [facility]  Post Acute Care Choice:    Choice offered to:     DME Arranged:    DME Agency:     HH Arranged:    HH Agency:     Status of Service:     If discussed at [employer], dates discussed:    Additional Comments:  Cherylann Parr, RN 02/04/2017, 3:33 PM

## 2017-02-04 NOTE — Interval H&P Note (Signed)
History and Physical Interval Note:  02/04/2017 12:42 PM  Chad Gillespie  has presented today for surgery, with the diagnosis of cp  The various methods of treatment have been discussed with the patient and family. After consideration of risks, benefits and other options for treatment, the patient has consented to  Procedure(s): Left Heart Cath and Coronary Angiography (N/A) as a surgical intervention .  The patient's history has been reviewed, patient examined, no change in status, stable for surgery.  I have reviewed the patient's chart and labs.  Questions were answered to the patient's satisfaction.    Cath Lab Visit (complete for each Cath Lab visit)  Clinical Evaluation Leading to the Procedure:   ACS: Yes.    Non-ACS:    Anginal Classification: CCS IV  Anti-ischemic medical therapy: No Therapy  Non-Invasive Test Results: No non-invasive testing performed  Prior CABG: No previous CABG       Chad Lafoe [place] MD,FACC 02/04/2017 12:42 PM

## 2017-02-04 NOTE — Progress Notes (Signed)
PROGRESS NOTE                                                                                                                                                                                                             Patient Demographics:    Chad Gillespie, is a 51 y.o. male, DOB - 07-Apr-1966, ZOX:[id number]  Admit date - 02/03/2017   Admitting Physician Clydie Braun, MD  Outpatient Primary MD for the patient is No PCP Per Patient  LOS - 0  Chief Complaint  Patient presents with  . Chest Pain       Brief Narrative   51 year old male with history of tobacco abuse, presents with complaints of chest pain and dyspnea, workup significant for COPD exacerbation and NSTEMI, and by cardiology, and plan for cardiac cath today   Subjective:    Chad Gillespie today has, No headache,  No abdominal pain - No Nausea, Reports mild dyspnea, cough, nonproductive.   Assessment  & Plan :    Principal Problem:   NSTEMI (non-ST elevated myocardial infarction) (HCC) Active Problems:   Chest pain   Tobacco abuse   Obesity   Leukocytosis   Hypoxia   Non-STEMI - Cardiology  input greatly appreciated, patient troponins trending up, negative d-dimer's, continue with heparin GTT, he denies any chest pain, continue with aspirin, seen by cardiology, plan for cardiac cath today.  COPD exacerbation - Continue with IV Solu-Medrol, no significant wheezing today, will start to taper, continue with IV levofloxacin, when necessary nebs as well  Acute hypoxic respiratory failure - Most likely related to COPD, wean oxygen as tolerated  Tobacco abuse - Counseled     Code Status : Full  Family Communication  : None at bedside  Disposition Plan  : pending further work up.   Consults  :  cardiolgoy  Procedures  : none  DVT Prophylaxis  :   Heparin gtt  Lab Results  Component Value Date   PLT 273 02/04/2017    Antibiotics  :    Anti-infectives    Start      Dose/Rate Route Frequency Ordered Stop   02/03/17 2000  [MAR Hold]  levofloxacin (LEVAQUIN) IVPB 500 mg     (MAR Hold since 02/04/17 1229)   500 mg 100 mL/hr over 60 Minutes Intravenous Every 24 hours 02/03/17 1842  Objective:   Vitals:   02/04/17 0400 02/04/17 0500 02/04/17 0800 02/04/17 1000  BP: 125/69   124/74  Pulse: 78 89 93 85  Resp: 14 16 10 18   Temp: 98.9 F (37.2 C)     TempSrc: Oral     SpO2: 90% 92% 90% 92%  Weight:      Height:        Wt Readings from Last 3 Encounters:  02/03/17 (!) 136.7 kg (301 lb 4.8 oz)     Intake/Output Summary (Last 24 hours) at 02/04/17 1406 Last data filed at 02/04/17 0900  Gross per 24 hour  Intake            981.5 ml  Output             1050 ml  Net            -68.5 ml     Physical Exam  Awake Alert, Oriented X 3 Supple Neck,No JVD, Symmetrical Chest wall movement, Good air movement bilaterally, CTAB RRR,No Gallops,Rubs or new Murmurs, No Parasternal Heave +ve B.Sounds, Abd Soft, No tenderness, , No rebound - guarding or rigidity. No Cyanosis, Clubbing , trace B/L edema, No new Rash or bruise      Data Review:    CBC  Recent Labs Lab 02/03/17 1440 02/04/17 0501  WBC 15.0* 15.8*  HGB 16.2 14.5  HCT 48.8 44.6  PLT 270 273  MCV 89.9 89.7  MCH 29.8 29.2  MCHC 33.2 32.5  RDW 13.0 12.9  LYMPHSABS  --  1.3  MONOABS  --  0.1  EOSABS  --  0.1  BASOSABS  --  0.0    Chemistries   Recent Labs Lab 02/03/17 1440  NA 138  K 4.1  CL 102  CO2 27  GLUCOSE 127*  BUN 10  CREATININE 1.18  CALCIUM 9.8   ------------------------------------------------------------------------------------------------------------------  Recent Labs  02/04/17 0933  CHOL 191  HDL 36*  LDLCALC 138*  TRIG 83  CHOLHDL 5.3    No results found for: HGBA1C ------------------------------------------------------------------------------------------------------------------ No results for input(s): TSH, T4TOTAL, T3FREE,  THYROIDAB in the last 72 hours.  Invalid input(s): FREET3 ------------------------------------------------------------------------------------------------------------------ No results for input(s): VITAMINB12, FOLATE, FERRITIN, TIBC, IRON, RETICCTPCT in the last 72 hours.  Coagulation profile  Recent Labs Lab 02/04/17 0933  INR 1.02     Recent Labs  02/03/17 1616  DDIMER 0.30    Cardiac Enzymes  Recent Labs Lab 02/03/17 1440 02/03/17 1817 02/03/17 2111  TROPONINI 0.05* 0.33* 0.74*   ------------------------------------------------------------------------------------------------------------------ No results found for: BNP  Inpatient Medications  Scheduled Meds: . [MAR Hold] aspirin EC  325 mg Oral Daily  . [MAR Hold] guaiFENesin  600 mg Oral BID  . [MAR Hold] methylPREDNISolone (SOLU-MEDROL) injection  60 mg Intravenous Q8H  . sodium chloride flush  3 mL Intravenous Q12H   Continuous Infusions: . sodium chloride 75 mL/hr at 02/04/17 0035  . sodium chloride    . sodium chloride 75 mL/hr at 02/04/17 1003  . heparin 2,000 Units/hr (02/04/17 0800)  . heparin    . [MAR Hold] levofloxacin (LEVAQUIN) IV Stopped (02/03/17 2025)   PRN Meds:.sodium chloride, [MAR Hold] acetaminophen, [MAR Hold] gi cocktail, heparin, heparin, iopamidol, [MAR Hold] ipratropium-albuterol, midazolam, [MAR Hold]  morphine injection, [MAR Hold] nicotine, nitroGLYCERIN, [MAR Hold] ondansetron (ZOFRAN) IV, Radial Cocktail/Verapamil only, sodium chloride flush, ticagrelor  Micro Results Recent Results (from the past 240 hour(s))  MRSA PCR Screening     Status: None   Collection Time: 02/03/17  10:36 PM  Result Value Ref Range Status   MRSA by PCR NEGATIVE NEGATIVE Final    Comment:        The GeneXpert MRSA Assay (FDA approved for NASAL specimens only), is one component of a comprehensive MRSA colonization surveillance program. It is not intended to diagnose MRSA infection nor to guide  or monitor treatment for MRSA infections.     Radiology Reports Dg Chest 2 View  Result Date: 02/03/2017 CLINICAL DATA:  Chest pain. EXAM: CHEST  2 VIEW COMPARISON:  None. FINDINGS: The heart size and mediastinal contours are within normal limits. Both lungs are clear. No pneumothorax or pleural effusion is noted. The visualized skeletal structures are unremarkable. IMPRESSION: No active cardiopulmonary disease. Electronically Signed   By: Lupita Raider, M.D.   On: 02/03/2017 15:31      Chad Gillespie M.D on 02/04/2017 at 2:06 PM  Between 7am to 7pm - Pager - [phone]  After 7pm go to [email or web address] - password [facility]  [facility] -  Office  [phone]

## 2017-02-05 DIAGNOSIS — D72829 Elevated white blood cell count, unspecified: Secondary | ICD-10-CM

## 2017-02-05 DIAGNOSIS — J441 Chronic obstructive pulmonary disease with (acute) exacerbation: Secondary | ICD-10-CM

## 2017-02-05 DIAGNOSIS — Z720 Tobacco use: Secondary | ICD-10-CM

## 2017-02-05 DIAGNOSIS — R0902 Hypoxemia: Secondary | ICD-10-CM

## 2017-02-05 DIAGNOSIS — G4733 Obstructive sleep apnea (adult) (pediatric): Secondary | ICD-10-CM

## 2017-02-05 DIAGNOSIS — I25110 Atherosclerotic heart disease of native coronary artery with unstable angina pectoris: Secondary | ICD-10-CM

## 2017-02-05 LAB — BASIC METABOLIC PANEL WITH GFR
Anion gap: 9 (ref 5–15)
BUN: 14 mg/dL (ref 6–20)
CO2: 26 mmol/L (ref 22–32)
Calcium: 9.1 mg/dL (ref 8.9–10.3)
Chloride: 103 mmol/L (ref 101–111)
Creatinine, Ser: 0.96 mg/dL (ref 0.61–1.24)
GFR calc Af Amer: >60 mL/min
GFR calc non Af Amer: >60 mL/min
Glucose, Bld: 121 mg/dL — ABNORMAL HIGH (ref 65–99)
Potassium: 4.1 mmol/L (ref 3.5–5.1)
Sodium: 138 mmol/L (ref 135–145)

## 2017-02-05 LAB — HEMOGLOBIN A1C
Hgb A1c MFr Bld: 6.1 % — ABNORMAL HIGH (ref 4.8–5.6)
Mean Plasma Glucose: 128 mg/dL

## 2017-02-05 LAB — CBC
HCT: 44.7 % (ref 39.0–52.0)
Hemoglobin: 14.5 g/dL (ref 13.0–17.0)
MCH: 29.6 pg (ref 26.0–34.0)
MCHC: 32.4 g/dL (ref 30.0–36.0)
MCV: 91.2 fL (ref 78.0–100.0)
Platelets: 252 10*3/uL (ref 150–400)
RBC: 4.9 MIL/uL (ref 4.22–5.81)
RDW: 13.2 % (ref 11.5–15.5)
WBC: 21.8 10*3/uL — ABNORMAL HIGH (ref 4.0–10.5)

## 2017-02-05 LAB — TROPONIN I
Troponin I: 8.53 ng/mL (ref <0.03)
Troponin I: 7.89 ng/mL (ref <0.03)

## 2017-02-05 LAB — LEGIONELLA PNEUMOPHILA SEROGP 1 UR AG: L. pneumophila Serogp 1 Ur Ag: NEGATIVE

## 2017-02-05 LAB — HIV ANTIBODY (ROUTINE TESTING W REFLEX): HIV Screen 4th Generation wRfx: NONREACTIVE

## 2017-02-05 MED ORDER — PREDNISONE 20 MG PO TABS: 20.0000 mg | ORAL_TABLET | Freq: Every day | ORAL | 0 refills | Status: AC

## 2017-02-05 MED ORDER — LEVOFLOXACIN 250 MG PO TABS: 250.0000 mg | ORAL_TABLET | Freq: Every day | ORAL | 0 refills | Status: DC

## 2017-02-05 MED ORDER — ASPIRIN 81 MG PO CHEW: 81.0000 mg | CHEWABLE_TABLET | Freq: Every day | ORAL | 1 refills | Status: DC

## 2017-02-05 MED ORDER — TICAGRELOR 90 MG PO TABS: 90.0000 mg | ORAL_TABLET | Freq: Two times a day (BID) | ORAL | 2 refills | Status: DC

## 2017-02-05 MED ORDER — ALBUTEROL SULFATE HFA 108 (90 BASE) MCG/ACT IN AERS: 2.0000 | INHALATION_SPRAY | Freq: Four times a day (QID) | RESPIRATORY_TRACT | 2 refills | Status: DC | PRN

## 2017-02-05 MED ORDER — ATORVASTATIN CALCIUM 80 MG PO TABS: 80.0000 mg | ORAL_TABLET | Freq: Every day | ORAL | 1 refills | Status: DC

## 2017-02-05 NOTE — Progress Notes (Signed)
Progress Note  Patient Name: Chad Gillespie Date of Encounter: 02/05/2017  Primary Cardiologist: Dr. Dina Rich  Subjective   Up in chair this morning. No shortness of breath or chest pain.  Inpatient Medications    Scheduled Meds: . aspirin  81 mg Oral Daily  . atorvastatin  80 mg Oral q1800  . guaiFENesin  600 mg Oral BID  . heparin  5,000 Units Subcutaneous Q8H  . methylPREDNISolone (SOLU-MEDROL) injection  40 mg Intravenous Q8H  . sodium chloride flush  3 mL Intravenous Q12H  . ticagrelor  90 mg Oral BID   Continuous Infusions: . sodium chloride    . levofloxacin [facility]) IV Stopped (02/04/17 2117)   PRN Meds: sodium chloride, acetaminophen, ipratropium-albuterol, morphine injection, nicotine, ondansetron (ZOFRAN) IV, sodium chloride flush   Vital Signs    Vitals:   02/05/17 0227 02/05/17 0232 02/05/17 0400 02/05/17 0744  BP:  (!) 163/89 (!) 141/70 (!) 125/55  Pulse: (!) 44 (!) 44 66 77  Resp: 14 15 16 18   Temp:   98.4 F (36.9 C) 98.7 F (37.1 C)  TempSrc:   Oral Oral  SpO2: (!) 86% 90% 94% (!) 87%  Weight:      Height:        Intake/Output Summary (Last 24 hours) at 02/05/17 0920 Last data filed at 02/05/17 0400  Gross per 24 hour  Intake          2355.65 ml  Output             2100 ml  Net           255.65 ml   Filed Weights   02/03/17 1432 02/03/17 2238  Weight: (!) 305 lb (138.3 kg) (!) 301 lb 4.8 oz (136.7 kg)    Telemetry    Currently sinus rhythm. Nocturnal pauses up to 3 or 4 seconds noted in the setting of hypoxia, also burst of NSVT. Personally reviewed.  ECG    Tracing from 02/05/2017 shows sinus rhythm with old inferior infarct pattern. Personally reviewed.  Physical Exam    GEN: Morbidly obese. No acute distress.   Neck: No JVD. Cardiac: RRR, no murmur, rub, or gallop.  Respiratory: Nonlabored. Increase breath sounds. GI: Soft, nontender, bowel sounds present. MS: No edema; No deformity. Neuro:  Nonfocal. Psych: Alert  and oriented x 3. Normal affect.  Labs    Chemistry Recent Labs Lab 02/03/17 1440 02/04/17 1829 02/05/17 0311  NA 138  --  138  K 4.1  --  4.1  CL 102  --  103  CO2 27  --  26  GLUCOSE 127*  --  121*  BUN 10  --  14  CREATININE 1.18 1.16 0.96  CALCIUM 9.8  --  9.1  GFRNONAA >60 >60 >60  GFRAA >60 >60 >60  ANIONGAP 9  --  9     Hematology Recent Labs Lab 02/04/17 0501 02/04/17 1829 02/05/17 0311  WBC 15.8* 22.4* 21.8*  RBC 4.97 4.84 4.90  HGB 14.5 14.4 14.5  HCT 44.6 43.4 44.7  MCV 89.7 89.7 91.2  MCH 29.2 29.8 29.6  MCHC 32.5 33.2 32.4  RDW 12.9 13.0 13.2  PLT 273 265 252    Cardiac Enzymes Recent Labs Lab 02/03/17 1440 02/03/17 1817 02/03/17 2111 02/05/17 0311  TROPONINI 0.05* 0.33* 0.74* 8.53*   No results for input(s): TROPIPOC in the last 168 hours.    DDimer  Recent Labs Lab 02/03/17 1616  DDIMER 0.30  Radiology    Dg Chest 2 View  Result Date: 02/03/2017 CLINICAL DATA:  Chest pain. EXAM: CHEST  2 VIEW COMPARISON:  None. FINDINGS: The heart size and mediastinal contours are within normal limits. Both lungs are clear. No pneumothorax or pleural effusion is noted. The visualized skeletal structures are unremarkable. IMPRESSION: No active cardiopulmonary disease. Electronically Signed   By: Lupita Raider, M.D.   On: 02/03/2017 15:31    Cardiac Studies   Echocardiogram 02/04/2017: Study Conclusions  - Left ventricle: The cavity size was normal. Wall thickness was   normal. Systolic function was normal. The estimated ejection   fraction was in the range of 55% to 60%. Wall motion was normal;   there were no regional wall motion abnormalities. Doppler   parameters are consistent with abnormal left ventricular   relaxation (grade 1 diastolic dysfunction).  Cardiac catheterization and PCI 02/04/2017:  1st Mrg lesion, 50 %stenosed.  Prox Cx to Mid Cx lesion, 50 %stenosed.  There is mild left ventricular systolic dysfunction.  The  left ventricular ejection fraction is 45-50% by visual estimate.  LV end diastolic pressure is moderately elevated.  Mid RCA to Dist RCA lesion, 100 %stenosed.  A STENT PROMUS PREM MR 3.0X20 drug eluting stent was successfully placed.  Post intervention, there is a 0% residual stenosis.  Mid RCA lesion, 70 %stenosed.  A STENT PROMUS PREM MR 3.5X16 drug eluting stent was successfully placed, and does not overlap previously placed stent.  Post intervention, there is a 0% residual stenosis.   1. 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 Mild LV dysfunction 3. Elevated LVEDP 4. Successful PCI of the mid and distal RCA with DES x 2.  Plan: DAPT for one year. Aggressive risk factor modification. If stable anticipate DC in am from a cardiac standpoint.  Patient Profile     51 y.o. male with history of morbid obesity and tobacco abuse, presents with NSTEMI and underwent cardiac catheterization on April 20 revealing severe RCA disease managed with DES 2. Also has documented nocturnal hypoxia and pauses very consistent with obstructive sleep apnea.  Assessment & Plan    1. NSTEMI, peak troponin I 8.53.  2. Single vessel obstructive CAD with culprit lesions in the mid and distal RCA status post placement of DES 2 on April 20. LVEF 55-60%.  3. Nocturnal hypoxia and pauses very consistent with obstructive sleep apnea. Patient has not undergone sleep testing and has no regular primary care provider.  4. Morbid obesity.  5. Suspected COPD exacerbation, on steroids per primary team. Leukocytosis likely related to this.  6. Tobacco abuse. Smoking cessation discussed.  Patient stable from a cardiac perspective for discharge. He would need to have follow-up in the [place] office with Dr. Wyline Mood or APP in the next 7-10 days. Continue aspirin, Brilinta, and Lipitor. Hold off on beta blocker for now with nocturnal pauses and  bradycardia. Can consider adding ARB and office follow up depending on blood pressure. He needs to be established with a PCP in [place] and also should have a sleep study to assess OSA and arrange treatment.  Signed, Nona Dell, MD  02/05/2017, 9:20 AM

## 2017-02-05 NOTE — Discharge Summary (Signed)
Physician Discharge Summary  Chad Gillespie ZOX:[id number] DOB: [birth year] DOA: 02/03/2017  PCP: No PCP Per Patient  Admit date: 02/03/2017 Discharge date: 02/05/2017  Time spent: >35 minutes  Recommendations for Outpatient Follow-up:  PCP in 3-7 days Cardiology in 10 days  Discharge Diagnoses:  Principal Problem:   NSTEMI (non-ST elevated myocardial infarction) [facility]) Active Problems:   Chest pain   Tobacco abuse   Obesity   Leukocytosis   Hypoxia   Discharge Condition: stable   Diet recommendation: low sodium   Filed Weights   02/03/17 1432 02/03/17 2238  Weight: (!) 138.3 kg (305 lb) (!) 136.7 kg (301 lb 4.8 oz)    History of present illness:  52 year old male with history of tobacco abuse, presents with complaints of chest pain and dyspnea, workup significant for COPD exacerbation and NSTEMI, and by cardiology, and underwent LHC -He presents with complaints of chest pain- substernal chest pain that was sharp with radiation across his chest and into his back some. Associated symptoms included diaphoresis. At that time symptoms self resolved within less than 30 minutes.  He does admit to smoking approximately up to 2 packs cigarettes per day on average. Patient is also needed by her primary care physician.   ED Course: Upon admission into the emergency department patient was seen to be afebrile, pulse 64-76, respirations 10-20, blood pressures 100/62-169/93, O2 saturations as low as 87% on room air. Labs revealed WBC 15, troponin 0.05, d-dimer 0.3, and other values relatively within normal limits. Chest x-ray was noted to be within normal limits. EKG showed no significant ischemic changes. Cardiology was consulted to evaluate the patient in the ED and he was placed on a heparin drip.   Hospital Course:   Non-STEMI. Cardiology  input greatly appreciated, received iv heparin GTT, continue with aspirin, underwent LHC and stenting of severe RCA disease managed with DES 2  COPD  exacerbation. Resolved with IV Solu-Medrol, bronchodilators. Transitioned to oral regimen, recommended to stop smoking and f/u with pulmonology for PFTs at discharge   Acute hypoxic respiratory failure. Resolved. Most likely related to COPD  Tobacco abuse - Counseled  Procedures:  LHC (i.e. Studies not automatically included, echos, thoracentesis, etc; not x-rays)  Consultations:  Cardiology   Discharge Exam: Vitals:   02/05/17 0400 02/05/17 0744  BP: (!) 141/70 (!) 125/55  Pulse: 66 77  Resp: 16 18  Temp: 98.4 F (36.9 C) 98.7 F (37.1 C)    General: alert, no distress Cardiovascular: s1,s2 rrr Respiratory: CTA BL  Discharge Instructions  Discharge Instructions    AMB Referral to Cardiac Rehabilitation - Phase II    Complete by:  As directed    Diagnosis:  NSTEMI   Amb Referral to Cardiac Rehabilitation    Complete by:  As directed    Diagnosis:   NSTEMI Coronary Stents     Diet - low sodium heart healthy    Complete by:  As directed    Discharge instructions    Complete by:  As directed    Please follow up with cardiology in 10 days Please follow up with primary care doctor in 3-7 days   Increase activity slowly    Complete by:  As directed      Allergies as of 02/05/2017      Reactions   Penicillins Hives   Has patient had a PCN reaction causing immediate rash, facial/tongue/throat swelling, SOB or lightheadedness with hypotension: Yes Has patient had a PCN reaction causing severe rash involving mucus membranes or  skin necrosis: No Has patient had a PCN reaction that required hospitalization No Has patient had a PCN reaction occurring within the last 10 years: No If all of the above answers are "NO", then may proceed with Cephalosporin use.      Medication List    TAKE these medications   albuterol 108 (90 Base) MCG/ACT inhaler Commonly known as:  PROVENTIL HFA;VENTOLIN HFA Inhale 2 puffs into the lungs every 6 (six) hours as needed for wheezing  or shortness of breath.   aspirin 81 MG chewable tablet Chew 1 tablet (81 mg total) by mouth daily. Start taking on:  02/06/2017   atorvastatin 80 MG tablet Commonly known as:  LIPITOR Take 1 tablet (80 mg total) by mouth daily at 6 PM.   levofloxacin 250 MG tablet Commonly known as:  LEVAQUIN Take 1 tablet (250 mg total) by mouth daily.   predniSONE 20 MG tablet Commonly known as:  DELTASONE Take 1 tablet (20 mg total) by mouth daily with breakfast.   ticagrelor 90 MG Tabs tablet Commonly known as:  BRILINTA Take 1 tablet (90 mg total) by mouth 2 (two) times daily.      Allergies  Allergen Reactions  . Penicillins Hives    Has patient had a PCN reaction causing immediate rash, facial/tongue/throat swelling, SOB or lightheadedness with hypotension: Yes Has patient had a PCN reaction causing severe rash involving mucus membranes or skin necrosis: No Has patient had a PCN reaction that required hospitalization No Has patient had a PCN reaction occurring within the last 10 years: No If all of the above answers are "NO", then may proceed with Cephalosporin use.    Follow-up Information    [facility] Follow up.   Why:  Please call 9 am Monday morning and request discharge follow up appointment. Contact information: [address] [place] [address] [phone]           The results of significant diagnostics from this hospitalization (including imaging, microbiology, ancillary and laboratory) are listed below for reference.    Significant Diagnostic Studies: Dg Chest 2 View  Result Date: 02/03/2017 CLINICAL DATA:  Chest pain. EXAM: CHEST  2 VIEW COMPARISON:  None. FINDINGS: The heart size and mediastinal contours are within normal limits. Both lungs are clear. No pneumothorax or pleural effusion is noted. The visualized skeletal structures are unremarkable. IMPRESSION: No active cardiopulmonary disease.  Electronically Signed   By: Lupita Raider, M.D.   On: 02/03/2017 15:31    Microbiology: Recent Results (from the past 240 hour(s))  MRSA PCR Screening     Status: None   Collection Time: 02/03/17 10:36 PM  Result Value Ref Range Status   MRSA by PCR NEGATIVE NEGATIVE Final    Comment:        The GeneXpert MRSA Assay (FDA approved for NASAL specimens only), is one component of a comprehensive MRSA colonization surveillance program. It is not intended to diagnose MRSA infection nor to guide or monitor treatment for MRSA infections.      Labs: Basic Metabolic Panel:  Recent Labs Lab 02/03/17 1440 02/04/17 1829 02/05/17 0311  NA 138  --  138  K 4.1  --  4.1  CL 102  --  103  CO2 27  --  26  GLUCOSE 127*  --  121*  BUN 10  --  14  CREATININE 1.18 1.16 0.96  CALCIUM 9.8  --  9.1   Liver Function Tests: No  results for input(s): AST, ALT, ALKPHOS, BILITOT, PROT, ALBUMIN in the last 168 hours. No results for input(s): LIPASE, AMYLASE in the last 168 hours. No results for input(s): AMMONIA in the last 168 hours. CBC:  Recent Labs Lab 02/03/17 1440 02/04/17 0501 02/04/17 1829 02/05/17 0311  WBC 15.0* 15.8* 22.4* 21.8*  NEUTROABS  --  14.8*  --   --   HGB 16.2 14.5 14.4 14.5  HCT 48.8 44.6 43.4 44.7  MCV 89.9 89.7 89.7 91.2  PLT 270 273 265 252   Cardiac Enzymes:  Recent Labs Lab 02/03/17 1440 02/03/17 1817 02/03/17 2111 02/05/17 0311 02/05/17 0822  TROPONINI 0.05* 0.33* 0.74* 8.53* 7.89*   BNP: BNP (last 3 results) No results for input(s): BNP in the last 8760 hours.  ProBNP (last 3 results) No results for input(s): PROBNP in the last 8760 hours.  CBG: No results for input(s): GLUCAP in the last 168 hours.     SignedEsperanza Sheets  [facility] 02/05/2017, 1:08 PM

## 2017-02-05 NOTE — Progress Notes (Signed)
Patient on Cpap with 8 L of O2. Patient continues to have pauses greater than 4 seconds and HR 30's with desats into the low 80's. Called respiratory  for possible interventions. Respiratory states that patient is currently on cpap protocol and unable to make adjustments for patient has not had a sleep study and pressure settings are not defined. Will notify Dr. Antionette Char and continue to monitor.

## 2017-02-05 NOTE — Progress Notes (Signed)
CARDIAC REHAB PHASE I   PRE:  Rate/Rhythm: 76 SR  BP:  Supine:  Sitting: 117/85  Standing:    SaO2: 91%RA  MODE:  Ambulation: 400 ft   POST:  Rate/Rhythm: 93 SR  BP:  Supine:   Sitting: 147/73  Standing:    SaO2: 93%RA 1030-1135 Pt walked 400 ft on RA with steady gait. Monitored sats whole walk. Pt with DOE but sats at 93%. MI education completed with pt and partner who voiced understanding. Stressed importance of brilinta with stent. Asked RN to have case manager see re brilinta. Does not have card. No insurance at this time but supposed to have May 1. Discussed NTG use, MI restrictions, heart healthy food choices, smoking cessation and ex ed. Gave pt fake cigarette and smoking cessation handout. He stated he quit once for about 6 months. Partner stated she smokes but will not smoke around him. Encouraged weight loss and cutting down sugars as he stated he eats a lot of little Debbie cakes. Discussed CRP 2 and will refer to [place]. Pt will probably not be able to do with work schedule. Partner stated will get him to walk with her. No CP during walk.  Luetta Nutting, RN BSN  02/05/2017 11:29 AM

## 2017-02-05 NOTE — Progress Notes (Signed)
Order obtained for Cpap. Will notify respiratory and continue to monitor.

## 2017-02-05 NOTE — Care Management Note (Signed)
Case Management Note  Patient Details  Name: [name] MRN: [id number] Date of Birth: [birth year]  Subjective/Objective:  51 yo M to be discharged today with Brilinta. Pt will have Commercial Insurance beginning Feb 15 2017 and therefore we will MATCH  until that time. CM and pt called [employer] to make sure coupon would assist in some manner and it will provide 100 dollars off. Pt will go to [facility] for PCP, encouraged to ask for samples when able. Stressed the importance of daily use of this med and not stopping without having alternative medication plan. Pt and wife verbalized understanding and appreciation. Faxed MATCH letter to Pharmacy of Choice, [facility] in [place], with confirmation.                 Action/Plan:CM will sign off for now but will be available should additional discharge needs arise or disposition change.    Expected Discharge Date:  02/05/17               Expected Discharge Plan:  Home/Self Care  In-House Referral:     Discharge planning Services  CM Consult, [facility]igent [facility]  Post Acute Care Choice:  NA Choice offered to:  Patient, Spouse  DME Arranged:  N/A DME Agency:  NA  HH Arranged:  NA HH Agency:  NA  Status of Service:  Completed, signed off  If discussed at Long Length of Stay Meetings, dates discussed:    Additional Comments:  Yvone Neu, RN 02/05/2017, 2:05 PM

## 2017-02-05 NOTE — Progress Notes (Signed)
Patient has had frequent pauses on the cardiac monitor with bradycardia as low as 38 while sleeping. During the evenst, patient is noted to have apnea with gasping return of breathing . His O2 sat at one event was as low as 69%. Patient is currently on O2 @ 3L [place]. Page sent to Dr. Antionette Char.

## 2017-02-10 ENCOUNTER — Encounter (INDEPENDENT_AMBULATORY_CARE_PROVIDER_SITE_OTHER): Payer: Self-pay | Admitting: Physician Assistant

## 2017-02-10 ENCOUNTER — Ambulatory Visit (INDEPENDENT_AMBULATORY_CARE_PROVIDER_SITE_OTHER): Payer: Self-pay | Admitting: Physician Assistant

## 2017-02-10 VITALS — BP 127/81 | HR 72 | Temp 97.9°F | Ht 71.0 in | Wt 306.8 lb

## 2017-02-10 DIAGNOSIS — R7303 Prediabetes: Secondary | ICD-10-CM

## 2017-02-10 DIAGNOSIS — I214 Non-ST elevation (NSTEMI) myocardial infarction: Secondary | ICD-10-CM

## 2017-02-10 DIAGNOSIS — E785 Hyperlipidemia, unspecified: Secondary | ICD-10-CM

## 2017-02-10 DIAGNOSIS — D72829 Elevated white blood cell count, unspecified: Secondary | ICD-10-CM

## 2017-02-10 NOTE — Patient Instructions (Signed)
Prediabetes Prediabetes is the condition of having a blood sugar (blood glucose) level that is higher than it should be, but not high enough for you to be diagnosed with type 2 diabetes. Having prediabetes puts you at risk for developing type 2 diabetes (type 2 diabetes mellitus). Prediabetes may be called impaired glucose tolerance or impaired fasting glucose. Prediabetes usually does not cause symptoms. Your health care provider can diagnose this condition with blood tests. You may be tested for prediabetes if you are overweight and if you have at least one other risk factor for prediabetes. Risk factors for prediabetes include:  Having a family member with type 2 diabetes.  Being overweight or obese.  Being older than age 45.  Being of American-Indian, African-American, Hispanic/Latino, or Asian/Pacific Islander descent.  Having an inactive (sedentary) lifestyle.  Having a history of gestational diabetes or polycystic ovarian syndrome (PCOS).  Having low levels of good cholesterol (HDL-C) or high levels of blood fats (triglycerides).  Having high blood pressure. What is blood glucose and how is blood glucose measured?   Blood glucose refers to the amount of glucose in your bloodstream. Glucose comes from eating foods that contain sugars and starches (carbohydrates) that the body breaks down into glucose. Your blood glucose level may be measured in mg/dL (milligrams per deciliter) or mmol/L (millimoles per liter).Your blood glucose may be checked with one or more of the following blood tests:  A fasting blood glucose (FBG) test. You will not be allowed to eat (you will fast) for at least 8 hours before a blood sample is taken.  A normal range for FBG is 70-100 mg/dl (3.9-5.6 mmol/L).  An A1c (hemoglobin A1c) blood test. This test provides information about blood glucose control over the previous 2?3months.  An oral glucose tolerance test (OGTT). This test measures your blood glucose  twice:  After fasting. This is your baseline level.  Two hours after you drink a beverage that contains glucose. You may be diagnosed with prediabetes:  If your FBG is 100?125 mg/dL (5.6-6.9 mmol/L).  If your A1c level is 5.7?6.4%.  If your OGGT result is 140?199 mg/dL (7.8-11 mmol/L). These blood tests may be repeated to confirm your diagnosis. What happens if blood glucose is too high? The pancreas produces a hormone (insulin) that helps move glucose from the bloodstream into cells. When cells in the body do not respond properly to insulin that the body makes (insulin resistance), excess glucose builds up in the blood instead of going into cells. As a result, high blood glucose (hyperglycemia) can develop, which can cause many complications. This is a symptom of prediabetes. What can happen if blood glucose stays higher than normal for a long time? Having high blood glucose for a long time is dangerous. Too much glucose in your blood can damage your nerves and blood vessels. Long-term damage can lead to complications from diabetes, which may include:  Heart disease.  Stroke.  Blindness.  Kidney disease.  Depression.  Poor circulation in the feet and legs, which could lead to surgical removal (amputation) in severe cases. How can prediabetes be prevented from turning into type 2 diabetes?   To help prevent type 2 diabetes, take the following actions:  Be physically active.  Do moderate-intensity physical activity for at least 30 minutes on at least 5 days of the week, or as much as told by your health care provider. This could be brisk walking, biking, or water aerobics.  Ask your health care provider what activities are   safe for you. A mix of physical activities may be best, such as walking, swimming, cycling, and strength training.  Lose weight as told by your health care provider.  Losing 5-7% of your body weight can reverse insulin resistance.  Your health care  provider can determine how much weight loss is best for you and can help you lose weight safely.  Follow a healthy meal plan. This includes eating lean proteins, complex carbohydrates, fresh fruits and vegetables, low-fat dairy products, and healthy fats.  Follow instructions from your health care provider about eating or drinking restrictions.  Make an appointment to see a diet and nutrition specialist (registered dietitian) to help you create a healthy eating plan that is right for you.  Do not smoke or use any tobacco products, such as cigarettes, chewing tobacco, and e-cigarettes. If you need help quitting, ask your health care provider.  Take over-the-counter and prescription medicines as told by your health care provider. You may be prescribed medicines that help lower the risk of type 2 diabetes. This information is not intended to replace advice given to you by your health care provider. Make sure you discuss any questions you have with your health care provider. Document Released: 01/26/2016 Document Revised: 03/11/2016 Document Reviewed: 11/25/2015 Elsevier Interactive Patient Education  2017 [employer]. Food Choices to Lower Your Triglycerides Triglycerides are a type of fat in your blood. High levels of triglycerides can increase the risk of heart disease and stroke. If your triglyceride levels are high, the foods you eat and your eating habits are very important. Choosing the right foods can help lower your triglycerides. What general guidelines do I need to follow?  Lose weight if you are overweight.  Limit or avoid alcohol.  Fill one half of your plate with vegetables and green salads.  Limit fruit to two servings a day. Choose fruit instead of juice.  Make one fourth of your plate whole grains. Look for the word "whole" as the first word in the ingredient list.  Fill one fourth of your plate with lean protein foods.  Enjoy fatty fish (such as salmon, mackerel,  sardines, and tuna) three times a week.  Choose healthy fats.  Limit foods high in starch and sugar.  Eat more home-cooked food and less restaurant, buffet, and fast food.  Limit fried foods.  Cook foods using methods other than frying.  Limit saturated fats.  Check ingredient lists to avoid foods with partially hydrogenated oils (trans fats) in them. What foods can I eat? Grains  Whole grains, such as whole wheat or whole grain breads, crackers, cereals, and pasta. Unsweetened oatmeal, bulgur, barley, quinoa, or brown rice. Corn or whole wheat flour tortillas. Vegetables  Fresh or frozen vegetables (raw, steamed, roasted, or grilled). Green salads. Fruits  All fresh, canned (in natural juice), or frozen fruits. Meat and Other Protein Products  Ground beef (85% or leaner), grass-fed beef, or beef trimmed of fat. Skinless chicken or [place]. Ground chicken or [place]. Pork trimmed of fat. All fish and seafood. Eggs. Dried beans, peas, or lentils. Unsalted nuts or seeds. Unsalted canned or dry beans. Dairy  Low-fat dairy products, such as skim or 1% milk, 2% or reduced-fat cheeses, low-fat ricotta or cottage cheese, or plain low-fat yogurt. Fats and Oils  Tub margarines without trans fats. Light or reduced-fat mayonnaise and salad dressings. Avocado. Safflower, olive, or canola oils. Natural peanut or almond butter. The items listed above may not be a complete list of recommended foods or  beverages. Contact your dietitian for more options.  What foods are not recommended? Grains  White bread. White pasta. White rice. Cornbread. Bagels, pastries, and croissants. Crackers that contain trans fat. Vegetables  White potatoes. Corn. Creamed or fried vegetables. Vegetables in a cheese sauce. Fruits  Dried fruits. Canned fruit in light or heavy syrup. Fruit juice. Meat and Other Protein Products  Fatty cuts of meat. Ribs, chicken wings, bacon, sausage, bologna, salami, chitterlings, fatback,  hot dogs, bratwurst, and packaged luncheon meats. Dairy  Whole or 2% milk, cream, half-and-half, and cream cheese. Whole-fat or sweetened yogurt. Full-fat cheeses. Nondairy creamers and whipped toppings. Processed cheese, cheese spreads, or cheese curds. Sweets and Desserts  Corn syrup, sugars, honey, and molasses. Candy. Jam and jelly. Syrup. Sweetened cereals. Cookies, pies, cakes, donuts, muffins, and ice cream. Fats and Oils  Butter, stick margarine, lard, shortening, ghee, or bacon fat. Coconut, palm kernel, or palm oils. Beverages  Alcohol. Sweetened drinks (such as sodas, lemonade, and fruit drinks or punches). The items listed above may not be a complete list of foods and beverages to avoid. Contact your dietitian for more information.  This information is not intended to replace advice given to you by your health care provider. Make sure you discuss any questions you have with your health care provider. Document Released: 07/22/2004 Document Revised: 03/11/2016 Document Reviewed: 08/08/2013 Elsevier Interactive Patient Education  2017 [employer].

## 2017-02-10 NOTE — Progress Notes (Signed)
Subjective:  Patient ID: Chad Gillespie, male    DOB: 1966-06-29  Age: 51 y.o. MRN: [id number]  CC: f/u ED visit s/p LHC  HPI Chad Gillespie is a 51 y.o. male with a PMH of tobacco abuse and COPD presents to f/u on ED visit on 02/03/17. Went to ED and was diagnosed with Non-STEMI. Had cardiac cath with DES x2 in RCA. Discharged on Brilinta, Aspirin, Atorvastatin, and Proventil. Taking medications as directed. Patient feels great since discharge. Has reduced smoking to total of 10 cigarettes since discharge and planning to quit completely. LE swelling and dyspnea has resolved. Only complaint is of mild numbness at the right wrist from the insertion of catheter. Cardiology f/u tomorrow at 1450 hrs. Denies CP, palpitations, orthopnea, PND, presyncope, syncope, tingling, numbness, HA, SOB, abdominal pain, bleeding, rash, f/c/n/v, or GI/GU sxs.   Outpatient Medications Prior to Visit  Medication Sig Dispense Refill  . albuterol (PROVENTIL HFA;VENTOLIN HFA) 108 (90 Base) MCG/ACT inhaler Inhale 2 puffs into the lungs every 6 (six) hours as needed for wheezing or shortness of breath. 1 Inhaler 2  . aspirin 81 MG chewable tablet Chew 1 tablet (81 mg total) by mouth daily. 30 tablet 1  . atorvastatin (LIPITOR) 80 MG tablet Take 1 tablet (80 mg total) by mouth daily at 6 PM. 30 tablet 1  . ticagrelor (BRILINTA) 90 MG TABS tablet Take 1 tablet (90 mg total) by mouth 2 (two) times daily. 60 tablet 2  . levofloxacin (LEVAQUIN) 250 MG tablet Take 1 tablet (250 mg total) by mouth daily. 3 tablet 0   No facility-administered medications prior to visit.      ROS Review of Systems  Constitutional: Negative for chills, fever and malaise/fatigue.  Eyes: Negative for blurred vision.  Respiratory: Negative for shortness of breath.   Cardiovascular: Negative for chest pain, palpitations, orthopnea, claudication and leg swelling.  Gastrointestinal: Negative for abdominal pain and nausea.  Genitourinary: Negative  for dysuria and hematuria.  Musculoskeletal: Negative for joint pain and myalgias.  Skin: Negative for rash.  Neurological: Negative for tingling and headaches.       Mild right wrist numbness  Psychiatric/Behavioral: Negative for depression. The patient is not nervous/anxious.     Objective:  BP 127/81 (BP Location: Left Arm, Patient Position: Sitting, Cuff Size: Large)   Pulse 72   Temp 97.9 F (36.6 C) (Oral)   Ht 5\' 11"  (1.803 m)   Wt (!) 306 lb 12.8 oz (139.2 kg)   SpO2 93%   BMI 42.79 kg/m   BP/Weight 02/10/2017 02/05/2017 02/03/2017  Systolic BP 127 119 -  Diastolic BP 81 56 -  Wt. (Lbs) 306.8 - 301.3  BMI 42.79 - 39.75      Physical Exam  Constitutional: He is oriented to person, place, and time.  Well developed, obese, NAD, polite  HENT:  Head: Normocephalic and atraumatic.  Eyes: No scleral icterus.  Neck: Normal range of motion. Neck supple. No thyromegaly present.  Cardiovascular: Normal rate, regular rhythm and normal heart sounds.   Pulmonary/Chest: Effort normal and breath sounds normal.  Abdominal: Soft. Bowel sounds are normal. There is no tenderness.  Musculoskeletal: He exhibits no edema.  Neurological: He is alert and oriented to person, place, and time. No cranial nerve deficit. Coordination normal.  Skin: Skin is warm and dry. No rash noted. No erythema. No pallor.  Cath insertion site without erythema, edema, ecchymosis, bleeding, or suppuration.  Psychiatric: He has a normal mood and affect. His behavior  is normal. Thought content normal.  Vitals reviewed.    Assessment & Plan:   1. Non-STEMI (non-ST elevated myocardial infarction) (HCC) - underwent LHC and stenting of severe RCA disease on 02/03/17 and  managed with DES 2 - Continue Brilinta - Comprehensive metabolic panel - Keep f/u appt with cardiology tomorrow.  2. Prediabetes - A1c 6.1% on 01/2017 - Comprehensive metabolic panel  3. Leukocytosis, unspecified type - WBC 21.8 on  02/05/17 - CBC with Differential - Comprehensive metabolic panel  4. Hyperlipidemia, unspecified hyperlipidemia type - LDL 138 - Continue on new rx for Atorvastatin 80mg     Follow-up: Return in about 4 weeks (around 03/10/2017) for full physical.   Chad Specter PA

## 2017-02-11 ENCOUNTER — Encounter: Payer: Self-pay | Admitting: Adult Health

## 2017-02-11 ENCOUNTER — Encounter: Payer: Self-pay | Admitting: *Deleted

## 2017-02-11 ENCOUNTER — Ambulatory Visit (INDEPENDENT_AMBULATORY_CARE_PROVIDER_SITE_OTHER): Payer: Self-pay | Admitting: Adult Health

## 2017-02-11 VITALS — BP 136/76 | HR 76 | Ht 71.0 in | Wt 307.0 lb

## 2017-02-11 DIAGNOSIS — E7800 Pure hypercholesterolemia, unspecified: Secondary | ICD-10-CM

## 2017-02-11 DIAGNOSIS — Z720 Tobacco use: Secondary | ICD-10-CM

## 2017-02-11 DIAGNOSIS — I2510 Atherosclerotic heart disease of native coronary artery without angina pectoris: Secondary | ICD-10-CM

## 2017-02-11 LAB — CBC WITH DIFFERENTIAL/PLATELET
BASOS: 0 %
Basophils Absolute: 0 10*3/uL (ref 0.0–0.2)
EOS (ABSOLUTE): 0.4 10*3/uL (ref 0.0–0.4)
Eos: 3 %
HEMATOCRIT: 46.7 % (ref 37.5–51.0)
Hemoglobin: 16.1 g/dL (ref 13.0–17.7)
Immature Grans (Abs): 0.2 10*3/uL — ABNORMAL HIGH (ref 0.0–0.1)
Immature Granulocytes: 1 %
LYMPHS: 24 %
Lymphocytes Absolute: 3.5 10*3/uL — ABNORMAL HIGH (ref 0.7–3.1)
MCH: 30.4 pg (ref 26.6–33.0)
MCHC: 34.5 g/dL (ref 31.5–35.7)
MCV: 88 fL (ref 79–97)
Monocytes Absolute: 0.9 10*3/uL (ref 0.1–0.9)
Monocytes: 6 %
NEUTROS PCT: 66 %
Neutrophils Absolute: 9.7 10*3/uL — ABNORMAL HIGH (ref 1.4–7.0)
Platelets: 284 10*3/uL (ref 150–379)
RBC: 5.29 x10E6/uL (ref 4.14–5.80)
RDW: 13.5 % (ref 12.3–15.4)
WBC: 14.8 10*3/uL — AB (ref 3.4–10.8)

## 2017-02-11 LAB — COMPREHENSIVE METABOLIC PANEL
A/G RATIO: 1.2 (ref 1.2–2.2)
ALK PHOS: 108 IU/L (ref 39–117)
ALT: 43 IU/L (ref 0–44)
AST: 23 IU/L (ref 0–40)
Albumin: 3.9 g/dL (ref 3.5–5.5)
BILIRUBIN TOTAL: 0.3 mg/dL (ref 0.0–1.2)
BUN/Creatinine Ratio: 12 (ref 9–20)
BUN: 12 mg/dL (ref 6–24)
CHLORIDE: 97 mmol/L (ref 96–106)
CO2: 28 mmol/L (ref 18–29)
Calcium: 9.6 mg/dL (ref 8.7–10.2)
Creatinine, Ser: 1.04 mg/dL (ref 0.76–1.27)
GFR calc Af Amer: 96 mL/min/{1.73_m2} (ref >59)
GFR calc non Af Amer: 83 mL/min/{1.73_m2} (ref >59)
GLOBULIN, TOTAL: 3.3 g/dL (ref 1.5–4.5)
Glucose: 66 mg/dL (ref 65–99)
POTASSIUM: 4.7 mmol/L (ref 3.5–5.2)
Sodium: 139 mmol/L (ref 134–144)
Total Protein: 7.2 g/dL (ref 6.0–8.5)

## 2017-02-11 MED ORDER — ATORVASTATIN CALCIUM 80 MG PO TABS: 80.0000 mg | ORAL_TABLET | Freq: Every day | ORAL | 6 refills | Status: DC

## 2017-02-11 MED ORDER — TICAGRELOR 90 MG PO TABS: 90.0000 mg | ORAL_TABLET | Freq: Two times a day (BID) | ORAL | 6 refills | Status: DC

## 2017-02-11 NOTE — Patient Instructions (Signed)
Your physician recommends that you schedule a follow-up appointment in: 3 Months   Your physician recommends that you continue on your current medications as directed. Please refer to the Current Medication list given to you today.  You have been referred to Cardiac Rehab   Your physician recommends that you return for lab work in: CBC, BMET  If you need a refill on your cardiac medications before your next appointment, please call your pharmacy.  Thank you for choosing [facility]!

## 2017-02-11 NOTE — Progress Notes (Signed)
Cardiology Office Note   Date:  02/11/2017   ID:  Chad Gillespie, DOB [birth year], MRN [id number]  PCP:  Loletta Specter, PA-C  Cardiologist: Arlington Calix, NP   Chief Complaint  Patient presents with  . Coronary Artery Disease  . Hospitalization Follow-up    History of Present Illness: Chad Gillespie is a 51 y.o. male who presents for posthospitalization follow-up after being admitted with chest pain. Diagnosed with non-ST elevation MI, history of obesity, COPD, and tobacco abuse. Patient underwent cardiac catheterization on 02/04/2017 which revealed single vessel obstructive CAD with 100% occlusion of the distal RCA and 70% stenosis of the mid RCA. Had mild LV dysfunction.  He is here today without any complaints of recurrent chest pain, dyspnea, or fatigue. He denies melena or hemoptysis on antiplatelet therapy. He denies any issues with worsening breathing with institution of Brilinta. He has cut down on his smoking from 4 packs a day to less than half a pack a week. He is not yet been referred to cardiac rehabilitation. He is medically compliant.  Past Medical History:  Diagnosis Date  . Tobacco abuse     Past Surgical History:  Procedure Laterality Date  . CORONARY STENT INTERVENTION N/A 02/04/2017   Procedure: Coronary Stent Intervention;  Surgeon: Peter M Swaziland, MD;  Location: [facility] INVASIVE CV LAB;  Service: Cardiovascular;  Laterality: N/A;  . ESOPHAGOGASTRODUODENOSCOPY (EGD) WITH ESOPHAGEAL DILATION    . LEFT HEART CATH AND CORONARY ANGIOGRAPHY N/A 02/04/2017   Procedure: Left Heart Cath and Coronary Angiography;  Surgeon: Peter M [place]az[place], MD;  Location: [facility] INVASIVE CV LAB;  Service: Cardiovascular;  Laterality: N/A;     Current Outpatient Prescriptions  Medication Sig Dispense Refill  . albuterol (PROVENTIL HFA;VENTOLIN HFA) 108 (90 Base) MCG/ACT inhaler Inhale 2 puffs into the lungs every 6 (six) hours as needed for wheezing or shortness of breath. 1 Inhaler 2   . aspirin 81 MG chewable tablet Chew 1 tablet (81 mg total) by mouth daily. 30 tablet 1  . atorvastatin (LIPITOR) 80 MG tablet Take 1 tablet (80 mg total) by mouth daily at 6 PM. 30 tablet 6  . ticagrelor (BRILINTA) 90 MG TABS tablet Take 1 tablet (90 mg total) by mouth 2 (two) times daily. 60 tablet 6   No current facility-administered medications for this visit.     Allergies:   Penicillins    Social History:  The patient  reports that he has been smoking Cigarettes.  He has been smoking about 0.10 packs per day. He has never used smokeless tobacco. He reports that he drinks alcohol. He reports that he does not use drugs.   Family History:  The patient's family history includes Cancer in his father; Diabetes in his father and sister; Heart attack in his father; Hypertension in his mother.    ROS: All other systems are reviewed and negative. Unless otherwise mentioned in H&P    PHYSICAL EXAM: VS:  BP 136/76   Pulse 76   Ht 5\' 11"  (1.803 m)   Wt (!) 307 lb (139.3 kg)   SpO2 92%   BMI 42.82 kg/m  , BMI Body mass index is 42.82 kg/m. GEN: Well nourished, well developed, in no acute distress  HEENT: normal  Neck: no JVD, carotid bruits, or masses Cardiac: RRR; no murmurs, rubs, or gallops,no edema  Respiratory:  clear to auscultation bilaterally, normal work of breathing GI: soft, nontender, nondistended, + BS MS: no deformity or atrophy Right wrist catheter insertion  site is well-healed without evidence of infection or hematoma. Skin: warm and dry, no rash Neuro:  Strength and sensation are intact Psych: euthymic mood, full affect   Recent Labs: 02/05/2017: Hemoglobin 14.5 02/10/2017: ALT 43; BUN 12; Creatinine, Ser 1.04; Platelets 284; Potassium 4.7; Sodium 139    Lipid Panel    Component Value Date/Time   CHOL 191 02/27/2017 0933   TRIG 83 Feb 27, 2017 0933   HDL 36 (L) 02/27/2017 0933   CHOLHDL 5.3 02-27-17 0933   VLDL 17 02/27/2017 0933   LDLCALC 138 (H)  02/27/2017 0933      Wt Readings from Last 3 Encounters:  02/11/17 (!) 307 lb (139.3 kg)  02/10/17 (!) 306 lb 12.8 oz (139.2 kg)  02/03/17 (!) 301 lb 4.8 oz (136.7 kg)      Other studies Reviewed: Echocardiogram Feb 27, 2017: Study Conclusions  - Left ventricle: The cavity size was normal. Wall thickness was normal. Systolic function was normal. The estimated ejection fraction was in the range of 55% to 60%. Wall motion was normal; there were no regional wall motion abnormalities. Doppler parameters are consistent with abnormal left ventricular relaxation (grade 1 diastolic dysfunction).  Cardiac catheterization and PCI 02/27/17:  1st Mrg lesion, 50 %stenosed.  Prox Cx to Mid Cx lesion, 50 %stenosed.  There is mild left ventricular systolic dysfunction.  The left ventricular ejection fraction is 45-50% by visual estimate.  LV end diastolic pressure is moderately elevated.  Mid RCA to Dist RCA lesion, 100 %stenosed.  A STENT PROMUS PREM MR 3.0X20 drug eluting stent was successfully placed.  Post intervention, there is a 0% residual stenosis.  Mid RCA lesion, 70 %stenosed.  A STENT PROMUS PREM MR 3.5X16 drug eluting stent was successfully placed, and does not overlap previously placed stent.  Post intervention, there is a 0% residual stenosis.  1. 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 Mild LV dysfunction  ASSESSMENT AND PLAN:  1. Coronary artery disease: Status post cardiac catheterization with 2 drug-eluting stents to the mid and distal right coronary artery. He is tolerating antiplatelet therapy, he denies any recurrent chest pain. He is currently not on a beta blocker. Heart rate is well controlled. Will refer him to cardiac rehabilitation. We'll see him again in 3 months. He will have a BMET and CBC prior to that appointment.  2. Hypercholesterolemia: The patient will continue on  atorvastatin 80 mg daily. Follow-up lipids and LFTs prior to next appointment.  3. Tobacco abuse: The patient has cut down from 4 packs a day to half a pack a week. I've encouraged him on continuing to try and quit completely. He verbalizes understanding.   Current medicines are reviewed at length with the patient today.    Labs/ tests ordered today include:   Orders Placed This Encounter  Procedures  . CBC with Differential  . Basic Metabolic Panel (BMET)  . Lipid Profile  . AMB referral to cardiac rehabilitation     Disposition:   FU with 3 months  Signed, Joni Reining, NP  02/11/2017 4:33 PM    [facility] [address] [place], [place] [address] Phone: [phone]; Fax: [phone]

## 2017-02-25 ENCOUNTER — Other Ambulatory Visit (HOSPITAL_COMMUNITY)
Admission: RE | Admit: 2017-02-25 | Discharge: 2017-02-25 | Disposition: A | Discharge status: Home / Self Care | Payer: BLUE CROSS/BLUE SHIELD | Source: Ambulatory Visit | Attending: Adult Health | Admitting: Adult Health

## 2017-02-25 ENCOUNTER — Other Ambulatory Visit: Payer: Self-pay | Admitting: *Deleted

## 2017-02-25 DIAGNOSIS — I2510 Atherosclerotic heart disease of native coronary artery without angina pectoris: Secondary | ICD-10-CM | POA: Diagnosis present

## 2017-02-25 LAB — BASIC METABOLIC PANEL
ANION GAP: 7 (ref 5–15)
BUN: 10 mg/dL (ref 6–20)
CHLORIDE: 103 mmol/L (ref 101–111)
CO2: 28 mmol/L (ref 22–32)
CREATININE: 1.1 mg/dL (ref 0.61–1.24)
Calcium: 9.4 mg/dL (ref 8.9–10.3)
GFR calc non Af Amer: >60 mL/min (ref >60)
Glucose, Bld: 122 mg/dL — ABNORMAL HIGH (ref 65–99)
Potassium: 3.5 mmol/L (ref 3.5–5.1)
SODIUM: 138 mmol/L (ref 135–145)

## 2017-02-25 LAB — CBC WITH DIFFERENTIAL/PLATELET
Basophils Absolute: 0.1 10*3/uL (ref 0.0–0.1)
Basophils Relative: 1 %
EOS ABS: 0.4 10*3/uL (ref 0.0–0.7)
Eosinophils Relative: 4 %
HCT: 45.5 % (ref 39.0–52.0)
HEMOGLOBIN: 15.6 g/dL (ref 13.0–17.0)
LYMPHS ABS: 3 10*3/uL (ref 0.7–4.0)
Lymphocytes Relative: 31 %
MCH: 30.4 pg (ref 26.0–34.0)
MCHC: 34.3 g/dL (ref 30.0–36.0)
MCV: 88.5 fL (ref 78.0–100.0)
Monocytes Absolute: 0.7 10*3/uL (ref 0.1–1.0)
Monocytes Relative: 7 %
NEUTROS ABS: 5.6 10*3/uL (ref 1.7–7.7)
NEUTROS PCT: 57 %
Platelets: 294 10*3/uL (ref 150–400)
RBC: 5.14 MIL/uL (ref 4.22–5.81)
RDW: 12.6 % (ref 11.5–15.5)
WBC: 9.7 10*3/uL (ref 4.0–10.5)

## 2017-02-25 MED ORDER — ATORVASTATIN CALCIUM 80 MG PO TABS: 80.0000 mg | ORAL_TABLET | Freq: Every day | ORAL | 6 refills | Status: DC

## 2017-03-11 ENCOUNTER — Encounter (INDEPENDENT_AMBULATORY_CARE_PROVIDER_SITE_OTHER): Payer: Self-pay | Admitting: Physician Assistant

## 2017-03-11 ENCOUNTER — Emergency Department (HOSPITAL_COMMUNITY)
Admission: EM | Admit: 2017-03-11 | Discharge: 2017-03-11 | Disposition: A | Discharge status: Home / Self Care | Payer: BLUE CROSS/BLUE SHIELD | Attending: Emergency Medicine | Admitting: Emergency Medicine

## 2017-03-11 ENCOUNTER — Encounter (HOSPITAL_COMMUNITY): Payer: Self-pay | Admitting: Emergency Medicine

## 2017-03-11 ENCOUNTER — Emergency Department (HOSPITAL_COMMUNITY): Payer: BLUE CROSS/BLUE SHIELD

## 2017-03-11 ENCOUNTER — Ambulatory Visit (INDEPENDENT_AMBULATORY_CARE_PROVIDER_SITE_OTHER): Payer: BLUE CROSS/BLUE SHIELD | Admitting: Physician Assistant

## 2017-03-11 VITALS — BP 142/78 | HR 80 | Temp 97.6°F | Ht 71.0 in | Wt 309.8 lb

## 2017-03-11 DIAGNOSIS — M7989 Other specified soft tissue disorders: Secondary | ICD-10-CM | POA: Diagnosis not present

## 2017-03-11 DIAGNOSIS — F17210 Nicotine dependence, cigarettes, uncomplicated: Secondary | ICD-10-CM | POA: Diagnosis not present

## 2017-03-11 DIAGNOSIS — R600 Localized edema: Secondary | ICD-10-CM | POA: Diagnosis not present

## 2017-03-11 DIAGNOSIS — L84 Corns and callosities: Secondary | ICD-10-CM

## 2017-03-11 HISTORY — DX: Acute myocardial infarction, unspecified: I21.9

## 2017-03-11 NOTE — Discharge Instructions (Signed)
Tests showed no evidence of a blood clot in either leg. Elevate legs. Decrease salt in your diet. Consider support hose

## 2017-03-11 NOTE — ED Triage Notes (Signed)
Patient complaining of swelling and redness in right leg from thigh down to foot for over a month. States he was sent by PCP for possible blood clot. Denies pain, complains of tingling and numbness in right foot.

## 2017-03-11 NOTE — Patient Instructions (Addendum)
You must proceed directly to the Emergency Department. You will need to be worked up for a deep vein thrombosis. Do not delay in going to the ED as clots are extremely dangerous and even fatal.   Deep Vein Thrombosis A deep vein thrombosis (DVT) is a blood clot (thrombus) that usually occurs in a deep, larger vein of the lower leg or the pelvis, or in an upper extremity such as the arm. These are dangerous and can lead to serious and even life-threatening complications if the clot travels to the lungs. A DVT can damage the valves in your leg veins so that instead of flowing upward, the blood pools in the lower leg. This is called post-thrombotic syndrome, and it can result in pain, swelling, discoloration, and sores on the leg. What are the causes? A DVT is caused by the formation of a blood clot in your leg, pelvis, or arm. Usually, several things contribute to the formation of blood clots. A clot may develop when:  Your blood flow slows down.  Your vein becomes damaged in some way.  You have a condition that makes your blood clot more easily. What increases the risk? A DVT is more likely to develop in:  People who are older, especially over 9 years of age.  People who are overweight (obese).  People who sit or lie still for a long time, such as during long-distance travel (over 4 hours), bed rest, hospitalization, or during recovery from certain medical conditions like a stroke.  People who do not engage in much physical activity (sedentary lifestyle).  People who have chronic breathing disorders.  People who have a personal or family history of blood clots or blood clotting disease.  People who have peripheral vascular disease (PVD), diabetes, or some types of cancer.  People who have heart disease, especially if the person had a recent heart attack or has congestive heart failure.  People who have neurological diseases that affect the legs (leg paresis).  People who have had a  traumatic injury, such as breaking a hip or leg.  People who have recently had major or lengthy surgery, especially on the hip, knee, or abdomen.  People who have had a central line placed inside a large vein.  People who take medicines that contain the hormone estrogen. These include birth control pills and hormone replacement therapy.  Pregnancy or during childbirth or the postpartum period.  Long plane flights (over 8 hours). What are the signs or symptoms?   Symptoms of a DVT can include:  Swelling of your leg or arm, especially if one side is much worse.  Warmth and redness of your leg or arm, especially if one side is much worse.  Pain in your arm or leg. If the clot is in your leg, symptoms may be more noticeable or worse when you stand or walk.  A feeling of pins and needles, if the clot is in the arm. The symptoms of a DVT that has traveled to the lungs (pulmonary embolism, PE) usually start suddenly and include:  Shortness of breath while active or at rest.  Coughing or coughing up blood or blood-tinged mucus.  Chest pain that is often worse with deep breaths.  Rapid or irregular heartbeat.  Feeling light-headed or dizzy.  Fainting.  Feeling anxious.  Sweating. There may also be pain and swelling in a leg if that is where the blood clot started. These symptoms may represent a serious problem that is an emergency. Do not wait  to see if the symptoms will go away. Get medical help right away. Call your local emergency services (911 in the [place] Do not drive yourself to the hospital.  How is this diagnosed? Your health care provider will take a medical history and perform a physical exam. You may also have other tests, including:  Blood tests to assess the clotting properties of your blood.  Imaging tests, such as CT, ultrasound, MRI, X-ray, and other tests to see if you have clots anywhere in your body. How is this treated? After a DVT is identified, it can be  treated. The type of treatment that you receive depends on many factors, such as the cause of your DVT, your risk for bleeding or developing more clots, and other medical conditions that you have. Sometimes, a combination of treatments is necessary. Treatment options may be combined and include:  Monitoring the blood clot with ultrasound.  Taking medicines by mouth, such as newer blood thinners (anticoagulants), thrombolytics, or warfarin.  Taking anticoagulant medicine by injection or through an IV tube.  Wearing compression stockings or using different types ofdevices.  Surgery (rare) to remove the blood clot or to place a filter in your abdomen to stop the blood clot from traveling to your lungs. Treatments for a DVT are often divided into immediate treatment and long-term treatment (up to 3 months after DVT). You can work with your health care provider to choose the treatment program that is best for you. Follow these instructions at home: If you are taking a newer oral anticoagulant:   Take the medicine every single day at the same time each day.  Understand what foods and drugs interact with this medicine.  Understand that there are no regular blood tests required when using this medicine.  Understand the side effects of this medicine, including excessive bruising or bleeding. Ask your health care provider or pharmacist about other possible side effects. If you are taking warfarin:   Understand how to take warfarin and know which foods can affect how warfarin works in Public relations account executive.  Understand that it is dangerous to take too much or too little warfarin. Too much warfarin increases the risk of bleeding. Too little warfarin continues to allow the risk for blood clots.  Follow your PT and INR blood testing schedule. The PT and INR results allow your health care provider to adjust your dose of warfarin. It is very important that you have your PT and INR tested as often as told by your  health care provider.  Avoid major changes in your diet, or tell your health care provider before you change your diet. Arrange a visit with a registered dietitian to answer your questions. Many foods, especially foods that are high in vitamin K, can interfere with warfarin and affect the PT and INR results. Eat a consistent amount of foods that are high in vitamin K, such as:  Spinach, kale, broccoli, cabbage, collard greens, turnip greens, Brussels sprouts, peas, cauliflower, seaweed, and parsley.  Beef liver and pork liver.  Green tea.  Soybean oil.  Tell your health care provider about any and all medicines, vitamins, and supplements that you take, including aspirin and other over-the-counter anti-inflammatory medicines. Be especially cautious with aspirin and anti-inflammatory medicines. Do not take those before you ask your health care provider if it is safe to do so. This is important because many medicines can interfere with warfarin and affect the PT and INR results.  Do not start or stop taking any  over-the-counter or prescription medicine unless your health care provider or pharmacist tells you to do so. If you take warfarin, you will also need to do these things:  Hold pressure over cuts for longer than usual.  Tell your dentist and other health care providers that you are taking warfarin before you have any procedures in which bleeding may occur.  Avoid alcohol or drink very small amounts. Tell your health care provider if you change your alcohol intake.  Do not use tobacco products, including cigarettes, chewing tobacco, and e-cigarettes. If you need help quitting, ask your health care provider.  Avoid contact sports. General instructions   Take over-the-counter and prescription medicines only as told by your health care provider. Anticoagulant medicines can have side effects, including easy bruising and difficulty stopping bleeding. If you are prescribed an anticoagulant,  you will also need to do these things:  Hold pressure over cuts for longer than usual.  Tell your dentist and other health care providers that you are taking anticoagulants before you have any procedures in which bleeding may occur.  Avoid contact sports.  Wear a medical alert bracelet or carry a medical alert card that says you have had a PE.  Ask your health care provider how soon you can go back to your normal activities. Stay active to prevent new blood clots from forming.  Make sure to exercise while traveling or when you have been sitting or standing for a long period of time. It is very important to exercise. Exercise your legs by walking or by tightening and relaxing your leg muscles often. Take frequent walks.  Wear compression stockings as told by your health care provider to help prevent more blood clots from forming.  Do not use tobacco products, including cigarettes, chewing tobacco, and e-cigarettes. If you need help quitting, ask your health care provider.  Keep all follow-up appointments with your health care provider. This is important. How is this prevented? Take these actions to decrease your risk of developing another DVT:  Exercise regularly. For at least 30 minutes every day, engage in:  Activity that involves moving your arms and legs.  Activity that encourages good blood flow through your body by increasing your heart rate.  Exercise your arms and legs every hour during long-distance travel (over 4 hours). Drink plenty of water and avoid drinking alcohol while traveling.  Avoid sitting or lying in bed for long periods of time without moving your legs.  Maintain a weight that is appropriate for your height. Ask your health care provider what weight is healthy for you.  If you are a woman who is over 55 years of age, avoid unnecessary use of medicines that contain estrogen. These include birth control pills.  Do not smoke, especially if you take estrogen  medicines. If you need help quitting, ask your health care provider. If you are hospitalized, prevention measures may include:  Early walking after surgery, as soon as your health care provider says that it is safe.  Receiving anticoagulants to prevent blood clots.If you cannot take anticoagulants, other options may be available, such as wearing compression stockings or using different types of devices. Get help right away if:  You have new or increased pain, swelling, or redness in an arm or leg.  You have numbness or tingling in an arm or leg.  You have shortness of breath while active or at rest.  You have chest pain.  You have a rapid or irregular heartbeat.  You feel light-headed  or dizzy.  You cough up blood.  You notice blood in your vomit, bowel movement, or urine. These symptoms may represent a serious problem that is an emergency. Do not wait to see if the symptoms will go away. Get medical help right away. Call your local emergency services (911 in the [place] Do not drive yourself to the hospital. This information is not intended to replace advice given to you by your health care provider. Make sure you discuss any questions you have with your health care provider. Document Released: 10/04/2005 Document Revised: 03/11/2016 Document Reviewed: 01/29/2015 Elsevier Interactive Patient Education  2017 [employer].

## 2017-03-11 NOTE — ED Notes (Signed)
Patient transported to Ultrasound 

## 2017-03-11 NOTE — Progress Notes (Signed)
Subjective:  Patient ID: Chad Gillespie, male    DOB: 1966-10-09  Age: 51 y.o. MRN: [id number]  CC: right LE swelling  HPI Chad Gillespie is a 51 y.o. male with a PMH of MI and tobacco abuse presents with concern of progressing swelling and redness of the RLE over the past month. His right thigh has grown to almost double the size of the left. Reports erythema of the right calf especially after work. Erythema seems to be getter darker as time passes. Swelling and redness seem to subside partially with rest. No long travel, hospitalization, or prolonged bed rest. Denies CP, palpitations, SOB, HA, tingling, numbness, weakness, rash, f/c/n/v, or GI sxs.    Has two areas on the right sole that are painful to bear weight on. Feels as if a shock goes up his leg when he steps on the lesions. Extremely bothersome/painful and would like some form of treatment today.   Outpatient Medications Prior to Visit  Medication Sig Dispense Refill  . albuterol (PROVENTIL HFA;VENTOLIN HFA) 108 (90 Base) MCG/ACT inhaler Inhale 2 puffs into the lungs every 6 (six) hours as needed for wheezing or shortness of breath. 1 Inhaler 2  . aspirin 81 MG chewable tablet Chew 1 tablet (81 mg total) by mouth daily. 30 tablet 1  . atorvastatin (LIPITOR) 80 MG tablet Take 1 tablet (80 mg total) by mouth daily at 6 PM. 30 tablet 6  . ticagrelor (BRILINTA) 90 MG TABS tablet Take 1 tablet (90 mg total) by mouth 2 (two) times daily. 60 tablet 6   No facility-administered medications prior to visit.      ROS Review of Systems  Constitutional: Negative for chills, fever and malaise/fatigue.  Eyes: Negative for blurred vision.  Respiratory: Negative for shortness of breath.   Cardiovascular: Negative for chest pain and palpitations.  Gastrointestinal: Negative for abdominal pain and nausea.  Genitourinary: Negative for dysuria and hematuria.  Musculoskeletal: Positive for myalgias (right calf). Negative for joint pain.  Skin:  Negative for rash.       RLE erythema. calluses  Neurological: Negative for tingling and headaches.  Psychiatric/Behavioral: Negative for depression. The patient is not nervous/anxious.     Objective:  BP (!) 142/78   Pulse 80   Temp 97.6 F (36.4 C) (Oral)   Ht 5\' 11"  (1.803 m)   Wt (!) 309 lb 12.8 oz (140.5 kg)   SpO2 92%   BMI 43.21 kg/m   BP/Weight 03/11/2017 03/11/2017 02/11/2017  Systolic BP 143 142 136  Diastolic BP 64 78 76  Wt. (Lbs) 309 309.8 307  BMI 43.1 43.21 42.82      Physical Exam  Constitutional: He is oriented to person, place, and time.  Well developed, obese, NAD, polite  HENT:  Head: Normocephalic and atraumatic.  Eyes: No scleral icterus.  Cardiovascular: Normal rate, regular rhythm and normal heart sounds.   Pulmonary/Chest: Effort normal and breath sounds normal.  Musculoskeletal: He exhibits no edema.  RLE edematous and approximately 1.5 to 2 times the size of the LLE. Mildly increased warmth as compared to the LLE. Erythema is not evident on RLE. Mild discomfort elicited with palpation of the right calf.  Two small calluses on the right sole.  Neurological: He is alert and oriented to person, place, and time.  Skin: Skin is warm and dry. No rash noted. No erythema. No pallor.  Psychiatric: He has a normal mood and affect. His behavior is normal. Thought content normal.  Vitals reviewed.  Assessment & Plan:   1. Lower extremity edema - Pt sent to ED for labs and [place] of RLE to help r/o DVT.   2. Callus of foot - Two small calluses quickly shaved down.     Follow-up: Return in about 1 week (around 03/18/2017).   Loletta Specter PA

## 2017-03-11 NOTE — ED Notes (Signed)
Continues in radiology 

## 2017-03-11 NOTE — ED Notes (Signed)
To ultrasound

## 2017-03-11 NOTE — ED Notes (Signed)
Pt reports a one month history of swelling legs with R greater than left.  Went to his physician in [place] today and states was sent here for an ultrasound to r/o blood clot

## 2017-03-12 NOTE — ED Provider Notes (Signed)
[facility]-EMERGENCY DEPT Provider Note   CSN: [id number] Arrival date & time: 03/11/17  1309     History   Chief Complaint Chief Complaint  Patient presents with  . Leg Swelling    HPI Chad Gillespie is a 51 y.o. male.  Patient complains of swelling in the right lower extremity from the distal thigh to the foot for approximately one month. 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  He is presently on Brilinta secondary to Mi in the past.  No chest pain or dyspnea.  No trauma to extremity.      Past Medical History:  Diagnosis Date  . MI (myocardial infarction) (HCC)   . Tobacco abuse     Patient Active Problem List   Diagnosis Date Noted  . Prediabetes 02/10/2017  . NSTEMI (non-ST elevated myocardial infarction) (HCC) 02/03/2017  . Chest pain 02/03/2017  . Tobacco abuse 02/03/2017  . Obesity 02/03/2017  . Leukocytosis 02/03/2017  . Hypoxia 02/03/2017    Past Surgical History:  Procedure Laterality Date  . CORONARY STENT INTERVENTION N/A 02/04/2017   Procedure: Coronary Stent Intervention;  Surgeon: Peter M Swaziland, MD;  Location: [facility] INVASIVE CV LAB;  Service: Cardiovascular;  Laterality: N/A;  . ESOPHAGOGASTRODUODENOSCOPY (EGD) WITH ESOPHAGEAL DILATION    . LEFT HEART CATH AND CORONARY ANGIOGRAPHY N/A 02/04/2017   Procedure: Left Heart Cath and Coronary Angiography;  Surgeon: Peter M Swaziland, MD;  Location: [facility] INVASIVE CV LAB;  Service: Cardiovascular;  Laterality: N/A;       Home Medications    Prior to Admission medications   Medication Sig Start Date End Date Taking? Authorizing Provider  albuterol (PROVENTIL HFA;VENTOLIN HFA) 108 (90 Base) MCG/ACT inhaler Inhale 2 puffs into the lungs every 6 (six) hours as needed for wheezing or shortness of breath. 02/05/17  Yes Buriev, Isaiah Serge, MD  aspirin 81 MG chewable tablet Chew 1 tablet (81 mg total) by mouth daily. 02/06/17  Yes Esperanza Sheets, MD  atorvastatin (LIPITOR) 80 MG tablet Take 1 tablet (80 mg total)  by mouth daily at 6 PM. 02/25/17  Yes Jodelle Gross, NP  ticagrelor (BRILINTA) 90 MG TABS tablet Take 1 tablet (90 mg total) by mouth 2 (two) times daily. 02/11/17  Yes Jodelle Gross, NP    Family History Family History  Problem Relation Age of Onset  . Hypertension Mother   . Cancer Father   . Heart attack Father   . Diabetes Father   . Diabetes Sister     Social History Social History  Substance Use Topics  . Smoking status: Current Every Day Smoker    Packs/day: 0.10    Types: Cigarettes  . Smokeless tobacco: Never Used  . Alcohol use Yes     Comment: occasionally      Allergies   Penicillins   Review of Systems Review of Systems  All other systems reviewed and are negative.    Physical Exam Updated Vital Signs BP (!) 117/92   Pulse (!) 55   Temp 97.7 F (36.5 C) (Oral)   Resp 17   Ht 5\' 11"  (1.803 m)   Wt (!) 140.2 kg (309 lb)   SpO2 (!) 88%   BMI 43.10 kg/m   Physical Exam  Constitutional: He is oriented to person, place, and time. He appears well-developed and well-nourished.  HENT:  Head: Normocephalic and atraumatic.  Eyes: Conjunctivae are normal.  Neck: Neck supple.  Cardiovascular: Normal rate and regular rhythm.   Pulmonary/Chest:  Effort normal and breath sounds normal.  Abdominal: Soft. Bowel sounds are normal.  Musculoskeletal: Normal range of motion.  Neurological: He is alert and oriented to person, place, and time.  Skin: Skin is warm and dry.  Psychiatric: He has a normal mood and affect. His behavior is normal.  Nursing note and vitals reviewed.    ED Treatments / Results  Labs (all labs ordered are listed, but only abnormal results are displayed) Labs Reviewed - No data to display  EKG  EKG Interpretation None       Radiology US Venous Img Lower Bilateral  Result Date: 03/11/2017 CLINICAL DATA:  Bilateral lower extremity pain and edema from the midthigh to the feet EXAM: BILATERAL LOWER EXTREMITY VENOUS  DOPPLER ULTRASOUND TECHNIQUE: Gray-scale sonography with graded compression, as well as color Doppler and duplex ultrasound were performed to evaluate the lower extremity deep venous systems from the level of the common femoral vein and including the common femoral, femoral, profunda femoral, popliteal and calf veins including the posterior tibial, peroneal and gastrocnemius veins when visible. The superficial great saphenous vein was also interrogated. Spectral Doppler was utilized to evaluate flow at rest and with distal augmentation maneuvers in the common femoral, femoral and popliteal veins. COMPARISON:  None. FINDINGS: RIGHT LOWER EXTREMITY Common Femoral Vein: No evidence of thrombus. Normal compressibility, respiratory phasicity and response to augmentation. Saphenofemoral Junction: No evidence of thrombus. Normal compressibility and flow on color Doppler imaging. Profunda Femoral Vein: No evidence of thrombus. Normal compressibility and flow on color Doppler imaging. Femoral Vein: No evidence of thrombus. Normal compressibility, respiratory phasicity and response to augmentation. Popliteal Vein: No evidence of thrombus. Normal compressibility, respiratory phasicity and response to augmentation. Calf Veins: No evidence of thrombus. Normal compressibility and flow on color Doppler imaging. Superficial Great Saphenous Vein: No evidence of thrombus. Normal compressibility and flow on color Doppler imaging. Other Findings:  Edema in the subcutaneous soft tissues LEFT LOWER EXTREMITY Common Femoral Vein: No evidence of thrombus. Normal compressibility, respiratory phasicity and response to augmentation. Saphenofemoral Junction: No evidence of thrombus. Normal compressibility and flow on color Doppler imaging. Profunda Femoral Vein: No evidence of thrombus. Normal compressibility and flow on color Doppler imaging. Femoral Vein: No evidence of thrombus. Normal compressibility, respiratory phasicity and response to  augmentation. Popliteal Vein: No evidence of thrombus. Normal compressibility, respiratory phasicity and response to augmentation. Calf Veins: No evidence of thrombus. Normal compressibility and flow on color Doppler imaging. Superficial Great Saphenous Vein: No evidence of thrombus. Normal compressibility and flow on color Doppler imaging. Other Findings:  Edema in the subcutaneous soft tissues IMPRESSION: No evidence of DVT within either lower extremity. Electronically Signed   By: Jasmine Pang M.D.   On: 03/11/2017 18:40    Procedures Procedures (including critical care time)  Medications Ordered in ED Medications - No data to display   Initial Impression / Assessment and Plan / ED Course  I have reviewed the triage vital signs and the nursing notes.  Pertinent labs & imaging results that were available during my care of the patient were reviewed by me and considered in my medical decision making (see chart for details).     Patient is hemodynamically stable. Doppler study of right lower extremity reveals no DVT. No evidence of cellulitis.  Final Clinical Impressions(s) / ED Diagnoses   Final diagnoses:  Leg swelling    New Prescriptions Discharge Medication List as of 03/11/2017  7:12 PM       Donnetta Hutching, MD  03/12/17 1523  

## 2017-03-22 ENCOUNTER — Ambulatory Visit (INDEPENDENT_AMBULATORY_CARE_PROVIDER_SITE_OTHER): Payer: BLUE CROSS/BLUE SHIELD | Admitting: Physician Assistant

## 2017-03-22 ENCOUNTER — Encounter (INDEPENDENT_AMBULATORY_CARE_PROVIDER_SITE_OTHER): Payer: Self-pay | Admitting: Physician Assistant

## 2017-03-22 VITALS — BP 127/78 | HR 80 | Temp 97.8°F | Wt 310.8 lb

## 2017-03-22 DIAGNOSIS — R609 Edema, unspecified: Secondary | ICD-10-CM | POA: Diagnosis not present

## 2017-03-22 DIAGNOSIS — Z23 Encounter for immunization: Secondary | ICD-10-CM | POA: Diagnosis not present

## 2017-03-22 LAB — POCT URINALYSIS DIPSTICK
Bilirubin, UA: NEGATIVE
Blood, UA: NEGATIVE
GLUCOSE UA: NEGATIVE
Ketones, UA: NEGATIVE
LEUKOCYTES UA: NEGATIVE
NITRITE UA: NEGATIVE
PROTEIN UA: NEGATIVE
SPEC GRAV UA: 1.015 (ref 1.010–1.025)
UROBILINOGEN UA: 1 U/dL
pH, UA: 6.5 (ref 5.0–8.0)

## 2017-03-22 NOTE — Progress Notes (Signed)
Subjective:  Patient ID: Chad Gillespie, male    DOB: July 13, 1966  Age: 51 y.o. MRN: [id number]  CC: f/u US results.   HPI Chad Gillespie is a 51 y.o. male with a PMH of MI and tobacco abuse presents for f/u of RLE edema. The entire RLE is edematous and approximately 1.5 to 2 times the size of the LLE. He was seen here on 03/11/17 and subsequently sent to the ED to r/o DVT. [place] result revealed only edema in the subcutaneous soft tissues of RLE and no DVT.  No ED labs were drawn. Says he does not have associated pain but there is intermittent erythema that is relieved with leg elevation.  Patient still works and is active. Feels well, denies SOB, CP, palpitations, HA, abdominal pain, fever, chills, nausea, vomiting, rash, GI/GU sxs, or limitations of the RLE.    Outpatient Medications Prior to Visit  Medication Sig Dispense Refill  . albuterol (PROVENTIL HFA;VENTOLIN HFA) 108 (90 Base) MCG/ACT inhaler Inhale 2 puffs into the lungs every 6 (six) hours as needed for wheezing or shortness of breath. 1 Inhaler 2  . aspirin 81 MG chewable tablet Chew 1 tablet (81 mg total) by mouth daily. 30 tablet 1  . atorvastatin (LIPITOR) 80 MG tablet Take 1 tablet (80 mg total) by mouth daily at 6 PM. 30 tablet 6  . ticagrelor (BRILINTA) 90 MG TABS tablet Take 1 tablet (90 mg total) by mouth 2 (two) times daily. 60 tablet 6   No facility-administered medications prior to visit.      ROS Review of Systems  Constitutional: Negative for chills, fever and malaise/fatigue.  Eyes: Negative for blurred vision.  Respiratory: Negative for shortness of breath.   Cardiovascular: Positive for leg swelling. Negative for chest pain and palpitations.  Gastrointestinal: Negative for abdominal pain and nausea.  Genitourinary: Negative for dysuria and hematuria.  Musculoskeletal: Negative for joint pain and myalgias.  Skin: Negative for rash.  Neurological: Negative for tingling and headaches.  Psychiatric/Behavioral:  Negative for depression. The patient is not nervous/anxious.     Objective:  BP 127/78 (BP Location: Left Arm, Patient Position: Sitting, Cuff Size: Large)   Pulse 80   Temp 97.8 F (36.6 C) (Oral)   Wt (!) 310 lb 12.8 oz (141 kg)   SpO2 90%   BMI 43.35 kg/m   BP/Weight 03/22/2017 03/11/2017 03/11/2017  Systolic BP 127 117 142  Diastolic BP 78 92 78  Wt. (Lbs) 310.8 309 309.8  BMI 43.35 43.1 43.21      Physical Exam  Constitutional: He is oriented to person, place, and time.  Well developed, obese, NAD, polite  HENT:  Head: Normocephalic and atraumatic.  Eyes: No scleral icterus.  Cardiovascular: Normal rate, regular rhythm and normal heart sounds.   Pulmonary/Chest: Effort normal and breath sounds normal.  Musculoskeletal: He exhibits no edema.  entire RLE is edematous and approximately 1.5 to 2 times the size of the LLE. Non pitting edema. Mild erythema on the pretibial region of the RLE. RLE with full aROM.  Neurological: He is alert and oriented to person, place, and time. No cranial nerve deficit. Coordination normal.  Skin: Skin is warm and dry. No rash noted. No pallor.  Very mildly increased warmth of the RLE  Psychiatric: He has a normal mood and affect. His behavior is normal. Thought content normal.  Vitals reviewed.    Assessment & Plan:   1. Edema, unspecified type - Suspend Atorvastatin until further notice. - Urinalysis  Dipstick - CK - CBC with Differential - Comprehensive metabolic panel - Thyroid Panel With TSH - Sedimentation Rate - C-reactive protein     Follow-up: Return in about 1 week (around 03/29/2017).   Loletta Specter PA

## 2017-03-22 NOTE — Patient Instructions (Signed)
Edema Edema is when you have too much fluid in your body or under your skin. Edema may make your legs, feet, and ankles swell up. Swelling is also common in looser tissues, like around your eyes. This is a common condition. It gets more common as you get older. There are many possible causes of edema. Eating too much salt (sodium) and being on your feet or sitting for a long time can cause edema in your legs, feet, and ankles. Hot weather may make edema worse. Edema is usually painless. Your skin may look swollen or shiny. Follow these instructions at home:  Keep the swollen body part raised (elevated) above the level of your heart when you are sitting or lying down.  Do not sit still or stand for a long time.  Do not wear tight clothes. Do not wear garters on your upper legs.  Exercise your legs. This can help the swelling go down.  Wear elastic bandages or support stockings as told by your doctor.  Eat a low-salt (low-sodium) diet to reduce fluid as told by your doctor.  Depending on the cause of your swelling, you may need to limit how much fluid you drink (fluid restriction).  Take over-the-counter and prescription medicines only as told by your doctor. Contact a doctor if:  Treatment is not working.  You have heart, liver, or kidney disease and have symptoms of edema.  You have sudden and unexplained weight gain. Get help right away if:  You have shortness of breath or chest pain.  You cannot breathe when you lie down.  You have pain, redness, or warmth in the swollen areas.  You have heart, liver, or kidney disease and get edema all of a sudden.  You have a fever and your symptoms get worse all of a sudden. Summary  Edema is when you have too much fluid in your body or under your skin.  Edema may make your legs, feet, and ankles swell up. Swelling is also common in looser tissues, like around your eyes.  Raise (elevate) the swollen body part above the level of your  heart when you are sitting or lying down.  Follow your doctor's instructions about diet and how much fluid you can drink (fluid restriction). This information is not intended to replace advice given to you by your health care provider. Make sure you discuss any questions you have with your health care provider. Document Released: 03/22/2008 Document Revised: 10/22/2016 Document Reviewed: 10/22/2016 Elsevier Interactive Patient Education  2017 Elsevier Inc.  

## 2017-03-23 ENCOUNTER — Telehealth (INDEPENDENT_AMBULATORY_CARE_PROVIDER_SITE_OTHER): Payer: Self-pay | Admitting: Physician Assistant

## 2017-03-23 LAB — CBC WITH DIFFERENTIAL/PLATELET
Basophils Absolute: 0.1 10*3/uL (ref 0.0–0.2)
Basos: 1 %
EOS (ABSOLUTE): 0.3 10*3/uL (ref 0.0–0.4)
Eos: 3 %
Hematocrit: 45.5 % (ref 37.5–51.0)
Hemoglobin: 15.1 g/dL (ref 13.0–17.7)
IMMATURE GRANULOCYTES: 0 %
Immature Grans (Abs): 0 10*3/uL (ref 0.0–0.1)
LYMPHS ABS: 3 10*3/uL (ref 0.7–3.1)
Lymphs: 28 %
MCH: 29.6 pg (ref 26.6–33.0)
MCHC: 33.2 g/dL (ref 31.5–35.7)
MCV: 89 fL (ref 79–97)
MONOS ABS: 0.7 10*3/uL (ref 0.1–0.9)
Monocytes: 7 %
NEUTROS PCT: 61 %
Neutrophils Absolute: 6.5 10*3/uL (ref 1.4–7.0)
PLATELETS: 282 10*3/uL (ref 150–379)
RBC: 5.1 x10E6/uL (ref 4.14–5.80)
RDW: 13.5 % (ref 12.3–15.4)
WBC: 10.6 10*3/uL (ref 3.4–10.8)

## 2017-03-23 LAB — COMPREHENSIVE METABOLIC PANEL
A/G RATIO: 1.5 (ref 1.2–2.2)
ALK PHOS: 120 IU/L — AB (ref 39–117)
ALT: 25 IU/L (ref 0–44)
AST: 19 IU/L (ref 0–40)
Albumin: 4.2 g/dL (ref 3.5–5.5)
BILIRUBIN TOTAL: 0.3 mg/dL (ref 0.0–1.2)
BUN/Creatinine Ratio: 11 (ref 9–20)
BUN: 12 mg/dL (ref 6–24)
CALCIUM: 10 mg/dL (ref 8.7–10.2)
CHLORIDE: 102 mmol/L (ref 96–106)
CO2: 28 mmol/L (ref 18–29)
Creatinine, Ser: 1.08 mg/dL (ref 0.76–1.27)
GFR calc Af Amer: 92 mL/min/{1.73_m2} (ref >59)
GFR calc non Af Amer: 80 mL/min/{1.73_m2} (ref >59)
Globulin, Total: 2.8 g/dL (ref 1.5–4.5)
Glucose: 86 mg/dL (ref 65–99)
POTASSIUM: 4.2 mmol/L (ref 3.5–5.2)
SODIUM: 141 mmol/L (ref 134–144)
Total Protein: 7 g/dL (ref 6.0–8.5)

## 2017-03-23 LAB — SEDIMENTATION RATE: Sed Rate: 7 mm/hr (ref 0–30)

## 2017-03-23 LAB — THYROID PANEL WITH TSH
Free Thyroxine Index: 1.4 (ref 1.2–4.9)
T3 Uptake Ratio: 23 % — ABNORMAL LOW (ref 24–39)
T4 TOTAL: 6.2 ug/dL (ref 4.5–12.0)
TSH: 1.54 u[IU]/mL (ref 0.450–4.500)

## 2017-03-23 LAB — C-REACTIVE PROTEIN: CRP: 7 mg/L — ABNORMAL HIGH (ref 0.0–4.9)

## 2017-03-23 LAB — CK: Total CK: 166 U/L (ref 24–204)

## 2017-03-23 NOTE — Telephone Encounter (Addendum)
Patient called back stated would like to get lab results.  Please follow up with patient

## 2017-03-29 ENCOUNTER — Encounter (INDEPENDENT_AMBULATORY_CARE_PROVIDER_SITE_OTHER): Payer: Self-pay | Admitting: Physician Assistant

## 2017-03-29 ENCOUNTER — Ambulatory Visit (INDEPENDENT_AMBULATORY_CARE_PROVIDER_SITE_OTHER): Payer: BLUE CROSS/BLUE SHIELD | Admitting: Physician Assistant

## 2017-03-29 VITALS — BP 119/72 | HR 79 | Temp 97.6°F | Wt 311.6 lb

## 2017-03-29 DIAGNOSIS — R600 Localized edema: Secondary | ICD-10-CM

## 2017-03-29 NOTE — Progress Notes (Signed)
Subjective:  Patient ID: Chad Gillespie, male    DOB: 08-Apr-1966  Age: 51 y.o. MRN: [id number]  CC: f/u RLE edema  HPI Chad Gillespie is a 51 y.o. male with a PMH of tobacco abuse and  MI s/p coronary artery stent placement on 02/04/17 presents for f/u of RLE edema. The entire RLE is edematous and approximately 1.5 to 2 times the size of the LLE. He was seen here on 03/11/17 and subsequently sent to the ED to r/o DVT. [place] result revealed only edema in the subcutaneous soft tissues of RLE and no DVT. Recent laboratory results from 03/22/17 revealed negative/normal ESR, TSH, SMP, CBC, and CK. CRP elevated slightly. Says he does not have associated pain but there is intermittent erythema worse at the end of his work day and relieved with leg elevation. Has suspended his atorvastatin as advised and has seen no difference. Generally feels well but did have one episode of waking up at night and gasping for air which eventually subsided on its own. Denies SOB, CP, palpitations, HA, abdominal pain, fever, chills, nausea, vomiting, rash, GI/GU sxs, or limitations of the RLE.   Outpatient Medications Prior to Visit  Medication Sig Dispense Refill  . albuterol (PROVENTIL HFA;VENTOLIN HFA) 108 (90 Base) MCG/ACT inhaler Inhale 2 puffs into the lungs every 6 (six) hours as needed for wheezing or shortness of breath. 1 Inhaler 2  . aspirin 81 MG chewable tablet Chew 1 tablet (81 mg total) by mouth daily. 30 tablet 1  . atorvastatin (LIPITOR) 80 MG tablet Take 1 tablet (80 mg total) by mouth daily at 6 PM. 30 tablet 6  . ticagrelor (BRILINTA) 90 MG TABS tablet Take 1 tablet (90 mg total) by mouth 2 (two) times daily. 60 tablet 6   No facility-administered medications prior to visit.      ROS Review of Systems  Constitutional: Negative for chills, fever and malaise/fatigue.  Eyes: Negative for blurred vision.  Respiratory: Negative for shortness of breath.   Cardiovascular: Positive for leg swelling and PND (?  one episode). Negative for chest pain and palpitations.  Gastrointestinal: Negative for abdominal pain and nausea.  Genitourinary: Negative for dysuria and hematuria.  Musculoskeletal: Negative for joint pain and myalgias.  Skin: Negative for rash.  Neurological: Negative for tingling and headaches.  Psychiatric/Behavioral: Negative for depression. The patient is not nervous/anxious.     Objective:  BP 119/72 (BP Location: Left Arm, Patient Position: Sitting, Cuff Size: Large)   Pulse 79   Temp 97.6 F (36.4 C) (Oral)   Wt (!) 311 lb 9.6 oz (141.3 kg)   SpO2 95%   BMI 43.46 kg/m   BP/Weight 03/29/2017 03/22/2017 06/19/4096  Systolic BP 353 299 242  Diastolic BP 72 78 92  Wt. (Lbs) 311.6 310.8 309  BMI 43.46 43.35 43.1      Physical Exam  Constitutional: He is oriented to person, place, and time.  Well developed, well nourished, NAD, polite  HENT:  Head: Normocephalic and atraumatic.  Eyes: No scleral icterus.  Neck: Normal range of motion. Neck supple. No thyromegaly present.  Cardiovascular: Normal rate, regular rhythm and normal heart sounds.   Trace pitting edema of the RLE.  Pulmonary/Chest: Effort normal. No respiratory distress. He has no wheezes. He has no rales.  Musculoskeletal: He exhibits no edema.  The whole RLE remains approximately one and a half to twice the size of the LLE. There is mild erythema of the lower pretibial region of the RLE. No  limited aROM of the RLE.  Neurological: He is alert and oriented to person, place, and time. No cranial nerve deficit. Coordination normal.  RLE sensation intact.  Skin: Skin is warm and dry. No rash noted. No erythema. No pallor.  Psychiatric: He has a normal mood and affect. His behavior is normal. Thought content normal.  Vitals reviewed.    Assessment & Plan:   1. Lower extremity edema - Recent workup negative for DVT with only edema of subcutaneous soft tissues found on [place]. Normal CBC, BMP, ESR, TSH, SMP, CBC,  and CK. CRP elevated slightly. Suspected lymphedema, etiology unknown at this point. Further work up with  CT vs MRI vs lymphoscintigraphy (less likely due to recent stent placement) may likely be warranted. I will consult with supervising physician to proceed with next phases of evaluation and treatment.  No orders of the defined types were placed in this encounter.   Follow-up: Will call.  Clent Demark PA

## 2017-03-30 ENCOUNTER — Telehealth (INDEPENDENT_AMBULATORY_CARE_PROVIDER_SITE_OTHER): Payer: Self-pay | Admitting: Physician Assistant

## 2017-03-30 NOTE — Telephone Encounter (Signed)
Patient left voicemail at 223pm stated Sindy Messing PA called and asked patient to call [facility].  I did not see any note of [place] calling him.  Please follow up with patient.

## 2017-03-30 NOTE — Telephone Encounter (Signed)
FWD to PCP. Tempestt S Roberts, CMA  

## 2017-04-01 ENCOUNTER — Other Ambulatory Visit (INDEPENDENT_AMBULATORY_CARE_PROVIDER_SITE_OTHER): Payer: Self-pay | Admitting: Physician Assistant

## 2017-04-01 DIAGNOSIS — M7989 Other specified soft tissue disorders: Secondary | ICD-10-CM

## 2017-04-01 NOTE — Telephone Encounter (Signed)
I have ordered a lymphangiogram for patient's work up of RLE swelling. Please see if you can schedule him. Thank you.

## 2017-04-01 NOTE — Telephone Encounter (Signed)
Left voicemail for [facility], inquiring if this needs to be scheduled through them? Maryjean Morn, CMA

## 2017-04-04 ENCOUNTER — Other Ambulatory Visit (INDEPENDENT_AMBULATORY_CARE_PROVIDER_SITE_OTHER): Payer: Self-pay | Admitting: Physician Assistant

## 2017-04-05 ENCOUNTER — Other Ambulatory Visit (INDEPENDENT_AMBULATORY_CARE_PROVIDER_SITE_OTHER): Payer: Self-pay | Admitting: Physician Assistant

## 2017-04-05 ENCOUNTER — Telehealth (INDEPENDENT_AMBULATORY_CARE_PROVIDER_SITE_OTHER): Payer: Self-pay | Admitting: Physician Assistant

## 2017-04-05 DIAGNOSIS — M7989 Other specified soft tissue disorders: Secondary | ICD-10-CM

## 2017-04-05 DIAGNOSIS — R600 Localized edema: Secondary | ICD-10-CM

## 2017-04-05 DIAGNOSIS — I872 Venous insufficiency (chronic) (peripheral): Secondary | ICD-10-CM

## 2017-04-05 NOTE — Telephone Encounter (Signed)
Resolved. Patient scheduled for venous insuff, imaging. Maryjean Morn, CMA

## 2017-04-05 NOTE — Telephone Encounter (Signed)
[facility] left message stated to please call back regarding order.  [phone]

## 2017-04-05 NOTE — Progress Notes (Signed)
I left message for patent stating he will need to have a venous insufficiency study done first before we proceed into further evaluation of the lymphatic system. [facility] should be calling him with appointment details.

## 2017-04-06 ENCOUNTER — Telehealth (INDEPENDENT_AMBULATORY_CARE_PROVIDER_SITE_OTHER): Payer: Self-pay | Admitting: Physician Assistant

## 2017-04-06 NOTE — Telephone Encounter (Signed)
I called patient and discussed further imaging for his RLE. I consulted with radiology and was advised on [place] for venous insufficiency vs CT abdomen and pelvis for lymphedema. I told him that I recommend the venous insufficiency [place] first then if need be, to do the CT abdomen and pelvis to look for upstream obstruction. I gave him the number to [facility]. Patient states he is in agreement with the plan.

## 2017-04-19 ENCOUNTER — Ambulatory Visit
Admission: RE | Admit: 2017-04-19 | Discharge: 2017-04-19 | Disposition: A | Discharge status: Home / Self Care | Payer: BLUE CROSS/BLUE SHIELD | Source: Ambulatory Visit | Attending: Physician Assistant | Admitting: Physician Assistant

## 2017-04-19 DIAGNOSIS — M7989 Other specified soft tissue disorders: Secondary | ICD-10-CM

## 2017-04-19 DIAGNOSIS — I872 Venous insufficiency (chronic) (peripheral): Secondary | ICD-10-CM

## 2017-04-19 NOTE — Consult Note (Signed)
Chief Complaint: Patient was seen in consultation today for right greater than left lower extremity swelling at the request of Loletta Specter  Referring Physician(s): Gomez,Roger Onalee Hua  History of Present Illness: ERIOLUWA BEDELL is a 51 y.o. male smoker who had no known past medical history until approximately 6 months ago. Around February 2018 he began developing intermittent right lower extremity edema which progressed throughout the day. By the end of the day he had symptoms of pain in his foot which felt like pins and needles.  In April 2018 he suffered a myocardial infarction. Since that time, he has been identified as having hyperlipidemia and borderline diabetes. He is currently on Brilinta, aspirin, and statin therapy.  His right lower extremity swelling happens nearly every day. 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 His legs feel the best when he wakes up in the morning are the worst at the end of the day. Elevation helps somewhat. He has not tried compression garments in the past. He has less severe symptoms in the left lower extremity which occur mostly in the foot and ankle rather than the whole leg.  He denies bulging veins or varicosities. He has no family history of venous insufficiency. He is an active every day smoker. He used to drink heavily but no longer uses alcohol.   Past Medical History:  Diagnosis Date  . MI (myocardial infarction) (HCC)   . Tobacco abuse     Past Surgical History:  Procedure Laterality Date  . CORONARY STENT INTERVENTION N/A 02/04/2017   Procedure: Coronary Stent Intervention;  Surgeon: Peter M Swaziland, MD;  Location: [facility] INVASIVE CV LAB;  Service: Cardiovascular;  Laterality: N/A;  . ESOPHAGOGASTRODUODENOSCOPY (EGD) WITH ESOPHAGEAL DILATION    . LEFT HEART CATH AND CORONARY ANGIOGRAPHY N/A 02/04/2017   Procedure: Left Heart Cath and Coronary Angiography;  Surgeon: Peter M Swaziland, MD;  Location: [facility] INVASIVE CV  LAB;  Service: Cardiovascular;  Laterality: N/A;    Allergies: Penicillins  Medications: Prior to Admission medications   Medication Sig Start Date End Date Taking? Authorizing Provider  albuterol (PROVENTIL HFA;VENTOLIN HFA) 108 (90 Base) MCG/ACT inhaler Inhale 2 puffs into the lungs every 6 (six) hours as needed for wheezing or shortness of breath. 02/05/17  Yes Buriev, Isaiah Serge, MD  aspirin 81 MG chewable tablet Chew 1 tablet (81 mg total) by mouth daily. 02/06/17  Yes Esperanza Sheets, MD  atorvastatin (LIPITOR) 80 MG tablet Take 1 tablet (80 mg total) by mouth daily at 6 PM. 02/25/17  Yes Jodelle Gross, NP  ticagrelor (BRILINTA) 90 MG TABS tablet Take 1 tablet (90 mg total) by mouth 2 (two) times daily. 02/11/17  Yes Jodelle Gross, NP     Family History  Problem Relation Age of Onset  . Hypertension Mother   . Cancer Father   . Heart attack Father   . Diabetes Father   . Diabetes Sister     Social History   Social History  . Marital status: Single    Spouse name: N/A  . Number of children: N/A  . Years of education: N/A   Social History Main Topics  . Smoking status: Current Every Day Smoker    Packs/day: 0.10    Types: Cigarettes  . Smokeless tobacco: Never Used  . Alcohol use Yes     Comment: occasionally   . Drug use: No  . Sexual activity: Not Asked   Other Topics  Concern  . None   Social History Narrative  . None    Review of Systems: A 12 point ROS discussed and pertinent positives are indicated in the HPI above.  All other systems are negative.  Review of Systems  Vital Signs: BP 139/86 (BP Location: Right Arm, Patient Position: Sitting, Cuff Size: Normal)   Pulse 84   Temp 97.8 F (36.6 C)   Resp (!) 22   SpO2 95%   Physical Exam  Constitutional: He is oriented to person, place, and time. He appears well-developed and well-nourished.  HENT:  Head: Normocephalic and atraumatic.  Eyes: No scleral icterus.  Cardiovascular: Normal  rate and regular rhythm.   Pulses:      Dorsalis pedis pulses are 1+ on the right side, and 1+ on the left side.  Pulmonary/Chest: Effort normal.  Abdominal: Soft. He exhibits no distension.  Musculoskeletal:  Mild 1+ edema RLW right mid shin to ankle.  No edema on the left at this time.   Neurological: He is alert and oriented to person, place, and time.  Skin: Skin is warm and dry.  Nursing note and vitals reviewed.    Imaging: US Venous Img Lower Bilateral  Result Date: 04/19/2017 CLINICAL DATA:  51 year old male with progressive right greater than left lower extremity edema over the past 6 months. EXAM: BILATERAL LOWER EXTREMITY VENOUS DUPLEX ULTRASOUND TECHNIQUE: Gray-scale sonography with graded compression, as well as color Doppler and duplex ultrasound, were performed to evaluate the deep and superficial veins of both lower extremities. Spectral Doppler was utilized to evaluate flow at rest and with distal augmentation maneuvers. A complete superficial venous insufficiency exam was performed in the upright standing position. I personally performed the technical portion of the exam. COMPARISON:  Prior duplex venous ultrasound for DVT 03/11/2017 FINDINGS: RIGHT Deep Venous System: Evaluation of the deep venous system including the common femoral, femoral, profunda femoral, popliteal and calf veins (where visible) demonstrate no evidence of deep venous thrombosis. The vessels are compressible and demonstrate normal respiratory phasicity and response to augmentation. No evidence of deep venous reflux. RIGHT Superficial Venous System: SFJ: Within normal limits. GSV Prox Thigh: Within normal limits. GSV Mid Thigh: Within normal limits. GSV Lower Thigh: Within normal limits. GSV Prox Calf: Within normal limits. GSV Mid Calf: Within normal limits. GSV Distal Calf: Within normal limits. SPJ: Within normal limits. SSV Prox: Within normal limits. SSV Mid: Within normal limits. SSV Distal: Within normal  limits. Other: None. LEFT Deep Venous System: Evaluation of the deep venous system including the common femoral, femoral, profunda femoral, popliteal and calf veins (where visible) demonstrate no evidence of deep venous thrombosis. The vessels are compressible and demonstrate normal respiratory phasicity and response to augmentation. No evidence of deep venous reflux. LEFT Superficial Venous System: SFJ: Within normal limits. GSV Prox Thigh: Within normal limits. GSV Mid Thigh: Within normal limits. GSV Lower Thigh: Within normal limits. GSV Prox Calf: Within normal limits. GSV Mid Calf: Within normal limits. GSV Distal Calf: Within normal limits. SPJ: Within normal limits. SSV Prox: Within normal limits. SSV Mid: Within normal limits. SSV Distal: Within normal limits. Other: None. IMPRESSION: 1. No evidence of superficial venous insufficiency. 2. No evidence of deep or superficial venous thrombosis. Signed, Sterling Big, MD [facility] Electronically Signed   By: Malachy Moan M.D.   On: 04/19/2017 10:53   [place] Rad Eval And Mgmt  Result Date: 04/19/2017 Please refer to "Notes" to see consult details.  Labs:  CBC:  Recent Labs  02/05/17 0311 02/10/17 1216 02/25/17 1429 03/22/17 1503  WBC 21.8* 14.8* 9.7 10.6  HGB 14.5 16.1 15.6 15.1  HCT 44.7 46.7 45.5 45.5  PLT 252 284 294 282    COAGS:  Recent Labs  02/03/17 1616 02/04/17 0933  INR  --  1.02  APTT 187*  --     BMP:  Recent Labs  02/05/17 0311 02/10/17 1216 02/25/17 1429 03/22/17 1503  NA 138 139 138 141  K 4.1 4.7 3.5 4.2  CL 103 97 103 102  CO2 26 28 28 28   GLUCOSE 121* 66 122* 86  BUN 14 12 10 12   CALCIUM 9.1 9.6 9.4 10.0  CREATININE 0.96 1.04 1.10 1.08  GFRNONAA >60 83 >60 80  GFRAA >60 96 >60 92    LIVER FUNCTION TESTS:  Recent Labs  02/10/17 1216 03/22/17 1503  BILITOT 0.3 0.3  AST 23 19  ALT 43 25  ALKPHOS 108 120*  PROT 7.2 7.0    ALBUMIN 3.9 4.2    TUMOR MARKERS: No results for input(s): AFPTM, CEA, CA199, CHROMGRNA in the last 8760 hours.  Assessment and Plan:  After a thorough clinical and level II ultrasound assessment, Mr. Postlewait has no evidence of superficial venous insufficiency, or deep or superficial venous thrombosis. His intermittent lower extremity swelling may be cardiogenic, or perhaps secondary to one of his medications.  For symptomatic control, I prescribed him bilateral thigh-high 15-20 mmHg compression hose.  Thank you for this interesting consult.  I greatly enjoyed meeting ELEX MAINWARING and look forward to participating in their care.  A copy of this report was sent to the requesting provider on this date.  Electronically Signed: MCCULLOUGH, HEATH 04/19/2017, 11:06 AM   I spent a total of  40 Minutes   in face to face in clinical consultation, greater than 50% of which was counseling/coordinating care for lower extremity swelling.

## 2017-04-21 ENCOUNTER — Other Ambulatory Visit (INDEPENDENT_AMBULATORY_CARE_PROVIDER_SITE_OTHER): Payer: Self-pay | Admitting: Physician Assistant

## 2017-04-21 DIAGNOSIS — R600 Localized edema: Secondary | ICD-10-CM

## 2017-04-21 NOTE — Progress Notes (Signed)
I spoke to patient of his result. We will now proceed with CT abdomen and pelvis with contrast. I will put the order in. Please call imaging and schedule for patient. He is not available on July 26th and 27th. All other days and times are ok.

## 2017-04-22 ENCOUNTER — Other Ambulatory Visit (INDEPENDENT_AMBULATORY_CARE_PROVIDER_SITE_OTHER): Payer: Self-pay | Admitting: Physician Assistant

## 2017-04-22 DIAGNOSIS — R600 Localized edema: Secondary | ICD-10-CM

## 2017-04-25 ENCOUNTER — Ambulatory Visit (HOSPITAL_COMMUNITY): Payer: BLUE CROSS/BLUE SHIELD | Admitting: Physical Therapy

## 2017-04-25 ENCOUNTER — Ambulatory Visit (INDEPENDENT_AMBULATORY_CARE_PROVIDER_SITE_OTHER): Payer: BLUE CROSS/BLUE SHIELD | Admitting: Physician Assistant

## 2017-04-25 ENCOUNTER — Encounter (INDEPENDENT_AMBULATORY_CARE_PROVIDER_SITE_OTHER): Payer: Self-pay | Admitting: Physician Assistant

## 2017-04-25 ENCOUNTER — Telehealth (HOSPITAL_COMMUNITY): Payer: Self-pay | Admitting: Physician Assistant

## 2017-04-25 VITALS — BP 131/78 | HR 64 | Temp 98.3°F | Wt 318.8 lb

## 2017-04-25 DIAGNOSIS — R600 Localized edema: Secondary | ICD-10-CM

## 2017-04-25 DIAGNOSIS — M7022 Olecranon bursitis, left elbow: Secondary | ICD-10-CM

## 2017-04-25 NOTE — Patient Instructions (Signed)
Edema Edema is when you have too much fluid in your body or under your skin. Edema may make your legs, feet, and ankles swell up. Swelling is also common in looser tissues, like around your eyes. This is a common condition. It gets more common as you get older. There are many possible causes of edema. Eating too much salt (sodium) and being on your feet or sitting for a long time can cause edema in your legs, feet, and ankles. Hot weather may make edema worse. Edema is usually painless. Your skin may look swollen or shiny. Follow these instructions at home:  Keep the swollen body part raised (elevated) above the level of your heart when you are sitting or lying down.  Do not sit still or stand for a long time.  Do not wear tight clothes. Do not wear garters on your upper legs.  Exercise your legs. This can help the swelling go down.  Wear elastic bandages or support stockings as told by your doctor.  Eat a low-salt (low-sodium) diet to reduce fluid as told by your doctor.  Depending on the cause of your swelling, you may need to limit how much fluid you drink (fluid restriction).  Take over-the-counter and prescription medicines only as told by your doctor. Contact a doctor if:  Treatment is not working.  You have heart, liver, or kidney disease and have symptoms of edema.  You have sudden and unexplained weight gain. Get help right away if:  You have shortness of breath or chest pain.  You cannot breathe when you lie down.  You have pain, redness, or warmth in the swollen areas.  You have heart, liver, or kidney disease and get edema all of a sudden.  You have a fever and your symptoms get worse all of a sudden. Summary  Edema is when you have too much fluid in your body or under your skin.  Edema may make your legs, feet, and ankles swell up. Swelling is also common in looser tissues, like around your eyes.  Raise (elevate) the swollen body part above the level of your  heart when you are sitting or lying down.  Follow your doctor's instructions about diet and how much fluid you can drink (fluid restriction). This information is not intended to replace advice given to you by your health care provider. Make sure you discuss any questions you have with your health care provider. Document Released: 03/22/2008 Document Revised: 10/22/2016 Document Reviewed: 10/22/2016 Elsevier Interactive Patient Education  2017 Elsevier Inc.  

## 2017-04-25 NOTE — Progress Notes (Signed)
Subjective:  Patient ID: Chad Gillespie, male    DOB: 1965/12/19  Age: 51 y.o. MRN: [id number]  CC: cyst on left elbow  HPI Chad Gillespie is a 51 y.o. male with a PMH of MI and tobacco abuse presents with "cyst" on left elbow. He frequently works on his elbows and knees. Cyst feels like it is filled with fluid. There is no pain, redness, or limited aROM. Denies f/c/n/v, rash, or paresthesia.    RLE edema continues the same as before. Has recently had a venous US done with no finding of DVT or venous insufficiency. A CT abdomen and pelvis with/without contrast has since been ordered but patient has not received an appointment date yet. Does not have pain but there is some erythema in the distal RLE at the end of his workday. Erythema is relieved with leg elevation. Does not endorse any CP, palpitations, SOB, HA, abdominal pain, or GI/GU sxs.     Outpatient Medications Prior to Visit  Medication Sig Dispense Refill  . albuterol (PROVENTIL HFA;VENTOLIN HFA) 108 (90 Base) MCG/ACT inhaler Inhale 2 puffs into the lungs every 6 (six) hours as needed for wheezing or shortness of breath. 1 Inhaler 2  . aspirin 81 MG chewable tablet Chew 1 tablet (81 mg total) by mouth daily. 30 tablet 1  . atorvastatin (LIPITOR) 80 MG tablet Take 1 tablet (80 mg total) by mouth daily at 6 PM. 30 tablet 6  . ticagrelor (BRILINTA) 90 MG TABS tablet Take 1 tablet (90 mg total) by mouth 2 (two) times daily. 60 tablet 6   No facility-administered medications prior to visit.      ROS Review of Systems  Constitutional: Negative for chills, fever and malaise/fatigue.  Eyes: Negative for blurred vision.  Respiratory: Negative for shortness of breath.   Cardiovascular: Positive for leg swelling. Negative for chest pain and palpitations.  Gastrointestinal: Negative for abdominal pain and nausea.  Genitourinary: Negative for dysuria and hematuria.  Musculoskeletal: Negative for joint pain and myalgias.  Skin: Negative  for rash.  Neurological: Negative for tingling and headaches.  Psychiatric/Behavioral: Negative for depression. The patient is not nervous/anxious.     Objective:  BP (!) 172/77 (BP Location: Left Arm, Patient Position: Sitting, Cuff Size: Normal)   Pulse 64   Temp 98.3 F (36.8 C) (Oral)   Wt (!) 318 lb 12.8 oz (144.6 kg)   SpO2 94%   BMI 44.46 kg/m   BP/Weight 04/25/2017 04/19/2017 03/29/2017  Systolic BP 172 139 119  Diastolic BP 77 86 72  Wt. (Lbs) 318.8 - 311.6  BMI 44.46 - 43.46      Physical Exam  Constitutional: He is oriented to person, place, and time.  Well developed, obese, NAD, polite  HENT:  Head: Normocephalic and atraumatic.  Eyes: No scleral icterus.  Cardiovascular: Normal rate, regular rhythm and normal heart sounds.   Pulmonary/Chest: Effort normal and breath sounds normal.  Musculoskeletal: He exhibits edema (Entirety of RLE is mild to moderately edematous).  Moderately sized fluctuant mass on left olecranon. Mildly increased warmth, no surrounding erythema or induration.  Neurological: He is alert and oriented to person, place, and time. No cranial nerve deficit. Coordination normal.  Skin: Skin is warm and dry. No rash noted. No erythema. No pallor.  Psychiatric: He has a normal mood and affect. His behavior is normal. Thought content normal.  Vitals reviewed.    Assessment & Plan:   1. Edema of right lower extremity - CT abdomen and pelvis  with and without contrast to help r/o lymphatic obstruction causing RLE edema.   2. Olecranon bursitis of left elbow - Approximately 25 mg of sanguinous fluid drained today.    Follow-up: Return if symptoms worsen or fail to improve.   Loletta Specter PA

## 2017-04-25 NOTE — Telephone Encounter (Signed)
04/25/17  pt cx via phone tree, I called to confirm that and he asked that we call him back to reschedule

## 2017-05-03 ENCOUNTER — Telehealth (INDEPENDENT_AMBULATORY_CARE_PROVIDER_SITE_OTHER): Payer: Self-pay | Admitting: Physician Assistant

## 2017-05-03 NOTE — Telephone Encounter (Signed)
Enrique Sack at [facility] radiology dept called stated needs Pre Certification for CT.  Due to patient ins requirements.  Please call by 2 pm on  05/04/2017 with authorization number. Patient CT scheduled for 05/05/2017  Please follow up with Kendra at [phone] ext [phone]

## 2017-05-04 NOTE — Telephone Encounter (Signed)
Left Chad Gillespie a detailed message including the PreCert authorization number and the dates of validity. Asked her to call office with any questions. precert id- [id number] 05/04/17-06/02/17. Maryjean Morn, CMA

## 2017-05-05 ENCOUNTER — Encounter (HOSPITAL_COMMUNITY): Payer: Self-pay

## 2017-05-05 ENCOUNTER — Ambulatory Visit (HOSPITAL_COMMUNITY)
Admission: RE | Admit: 2017-05-05 | Discharge: 2017-05-05 | Disposition: A | Discharge status: Home / Self Care | Payer: BLUE CROSS/BLUE SHIELD | Source: Ambulatory Visit | Attending: Physician Assistant | Admitting: Physician Assistant

## 2017-05-05 DIAGNOSIS — R600 Localized edema: Secondary | ICD-10-CM | POA: Diagnosis present

## 2017-05-05 DIAGNOSIS — D734 Cyst of spleen: Secondary | ICD-10-CM | POA: Insufficient documentation

## 2017-05-05 DIAGNOSIS — I700 Atherosclerosis of aorta: Secondary | ICD-10-CM | POA: Diagnosis not present

## 2017-05-05 MED ORDER — IOPAMIDOL (ISOVUE-300) INJECTION 61%
125.0000 mL | Freq: Once | INTRAVENOUS | Status: AC | PRN
  Administered 2017-05-05: 125 mL via INTRAVENOUS

## 2017-05-05 NOTE — Telephone Encounter (Signed)
Please verify with Enrique Sack that she received this information. Thank you.

## 2017-05-06 ENCOUNTER — Other Ambulatory Visit (INDEPENDENT_AMBULATORY_CARE_PROVIDER_SITE_OTHER): Payer: Self-pay | Admitting: Physician Assistant

## 2017-05-06 DIAGNOSIS — R600 Localized edema: Secondary | ICD-10-CM

## 2017-05-13 ENCOUNTER — Ambulatory Visit: Payer: Self-pay | Admitting: Adult Health

## 2017-05-27 ENCOUNTER — Encounter: Payer: Self-pay | Admitting: Adult Health

## 2017-05-27 ENCOUNTER — Ambulatory Visit (INDEPENDENT_AMBULATORY_CARE_PROVIDER_SITE_OTHER): Payer: BLUE CROSS/BLUE SHIELD | Admitting: Adult Health

## 2017-05-27 VITALS — BP 120/60 | HR 63 | Ht 71.0 in | Wt 322.1 lb

## 2017-05-27 DIAGNOSIS — I10 Essential (primary) hypertension: Secondary | ICD-10-CM | POA: Diagnosis not present

## 2017-05-27 DIAGNOSIS — R0609 Other forms of dyspnea: Secondary | ICD-10-CM

## 2017-05-27 DIAGNOSIS — I2510 Atherosclerotic heart disease of native coronary artery without angina pectoris: Secondary | ICD-10-CM

## 2017-05-27 DIAGNOSIS — Z720 Tobacco use: Secondary | ICD-10-CM

## 2017-05-27 DIAGNOSIS — G4700 Insomnia, unspecified: Secondary | ICD-10-CM | POA: Diagnosis not present

## 2017-05-27 MED ORDER — HYDROCHLOROTHIAZIDE 12.5 MG PO CAPS
12.5000 mg | ORAL_CAPSULE | Freq: Every day | ORAL | 3 refills | Status: DC | PRN
Start: 2017-05-27 — End: 2017-06-13

## 2017-05-27 NOTE — Patient Instructions (Signed)
Medication Instructions:   Your physician has recommended you make the following change in your medication:   Start hydrochlorothiazide 12.5 mg by mouth daily as needed for fluid retention.  Continue all other medications the same.  Labwork:  NONE  Testing/Procedures: Your physician has recommended that you have a pulmonary function test. Pulmonary Function Tests are a group of tests that measure how well air moves in and out of your lungs. Your physician has recommended that you have a sleep study. This test records several body functions during sleep, including: brain activity, eye movement, oxygen and carbon dioxide blood levels, heart rate and rhythm, breathing rate and rhythm, the flow of air through your mouth and nose, snoring, body muscle movements, and chest and belly movement.  Follow-Up:  Your physician recommends that you schedule a follow-up appointment in: 6 months. You will receive a reminder letter in the mail in about 4 months reminding you to call and schedule your appointment. If you don't receive this letter, please contact our office.  Any Other Special Instructions Will Be Listed Below (If Applicable).  If you need a refill on your cardiac medications before your next appointment, please call your pharmacy.

## 2017-05-27 NOTE — Progress Notes (Signed)
Cardiology Office Note   Date:  05/27/2017   ID:  Chad Gillespie, DOB 1966-02-10, MRN [id number]  PCP:  Denny Levy  Cardiologist:  [facility]   Chief Complaint  Patient presents with  . Coronary Artery Disease  . Nicotine Dependence  . Shortness of Breath      History of Present Illness: Chad Gillespie is a 51 y.o. male who presents for ongoing assessment and management of coronary artery disease status post non-ST elevation MI in April 2018, revealing single-vessel obstructive CAD with 100% occlusion of the distal RCA and 70% stenosis of the mid RCA. The patient underwent stent placement to the mid RCA.   Other history includes ongoing tobacco abuse. He was last seen in the office on 02/11/2017. At that time the patient was doing well, he was tolerating the medication regimen without side effects of bleeding or nausea or vomiting. He had reduced his smoking to half a pack a week from 4 packs a week.  He comes today with chronic lower extremity edema, chronic dyspnea on exertion, complaining of insomnia and waking up at night having trouble breathing. He states his primary care physician is tested him for vascular occlusion, and is also done CT scans of his abdomen to evaluate for occlusions further up. They have come back negative.  He denies recurrent chest pain but he states that his breathing status is causing to have some pressure. Over the summer his energy level has decreased some. He has not had to take any nitroglycerin. He is medically compliant. No complaints of bleeding or myalgias. He unfortunately continues to smoke.  Past Medical History:  Diagnosis Date  . MI (myocardial infarction) (HCC)   . Tobacco abuse     Past Surgical History:  Procedure Laterality Date  . CORONARY STENT INTERVENTION N/A 02/04/2017   Procedure: Coronary Stent Intervention;  Surgeon: Peter M Swaziland, MD;  Location: [facility] INVASIVE CV LAB;  Service: Cardiovascular;  Laterality: N/A;  .  ESOPHAGOGASTRODUODENOSCOPY (EGD) WITH ESOPHAGEAL DILATION    . LEFT HEART CATH AND CORONARY ANGIOGRAPHY N/A 02/04/2017   Procedure: Left Heart Cath and Coronary Angiography;  Surgeon: Peter M Swaziland, MD;  Location: [facility] INVASIVE CV LAB;  Service: Cardiovascular;  Laterality: N/A;     Current Outpatient Prescriptions  Medication Sig Dispense Refill  . albuterol (PROVENTIL HFA;VENTOLIN HFA) 108 (90 Base) MCG/ACT inhaler Inhale 2 puffs into the lungs every 6 (six) hours as needed for wheezing or shortness of breath. 1 Inhaler 2  . aspirin 81 MG chewable tablet Chew 1 tablet (81 mg total) by mouth daily. 30 tablet 1  . atorvastatin (LIPITOR) 80 MG tablet Take 1 tablet (80 mg total) by mouth daily at 6 PM. 30 tablet 6  . ticagrelor (BRILINTA) 90 MG TABS tablet Take 1 tablet (90 mg total) by mouth 2 (two) times daily. 60 tablet 6  . hydrochlorothiazide (MICROZIDE) 12.5 MG capsule Take 1 capsule (12.5 mg total) by mouth daily as needed. For fluid retention 30 capsule 3   No current facility-administered medications for this visit.     Allergies:   Penicillins    Social History:  The patient  reports that he has been smoking Cigarettes.  He started smoking about 31 years ago. He has been smoking about 0.10 packs per day. He has never used smokeless tobacco. He reports that he drinks alcohol. He reports that he does not use drugs.   Family History:  The patient's family history includes Cancer in his  father; Diabetes in his father and sister; Heart attack in his father; Hypertension in his mother.    ROS: All other systems are reviewed and negative. Unless otherwise mentioned in H&P    PHYSICAL EXAM: VS:  BP 120/60   Pulse 63   Ht 5\' 11"  (1.803 m)   Wt (!) 322 lb 1.6 oz (146.1 kg)   SpO2 92% Comment: on room air  BMI 44.92 kg/m  , BMI Body mass index is 44.92 kg/m. GEN: Well nourished, well developed, in no acute distress  HEENT: normal  Neck: no JVD, carotid bruits, or masses Cardiac:  RRR; no murmurs, rubs, or gallops, dependent nonpitting edema  Respiratory:  Bilateral upper airway crackles, cleared with cough. Morbidly obese GI: soft, nontender, nondistended, + BS MS: no deformity or atrophy  Skin: warm and dry, no rash Neuro:  Strength and sensation are intact Psych: euthymic mood, full affect   Recent Labs: 03/22/2017: ALT 25; BUN 12; Creatinine, Ser 1.08; Hemoglobin 15.1; Platelets 282; Potassium 4.2; Sodium 141; TSH 1.540    Lipid Panel    Component Value Date/Time   CHOL 191 02/04/2017 0933   TRIG 83 02/04/2017 0933   HDL 36 (L) 02/04/2017 0933   CHOLHDL 5.3 02/04/2017 0933   VLDL 17 02/04/2017 0933   LDLCALC 138 (H) 02/04/2017 0933      Wt Readings from Last 3 Encounters:  05/27/17 (!) 322 lb 1.6 oz (146.1 kg)  04/25/17 (!) 318 lb 12.8 oz (144.6 kg)  03/29/17 (!) 311 lb 9.6 oz (141.3 kg)      Other studies Reviewed:  Echocardiogram 02/04/2017 Left ventricle: The cavity size was normal. Wall thickness was   normal. Systolic function was normal. The estimated ejection   fraction was in the range of 55% to 60%. Wall motion was normal;   there were no regional wall motion abnormalities. Doppler   parameters are consistent with abnormal left ventricular   relaxation (grade 1 diastolic dysfunction).  Cardiac Cath 02/04/2017 Conclusion     1st Mrg lesion, 50 %stenosed.  Prox Cx to Mid Cx lesion, 50 %stenosed.  There is mild left ventricular systolic dysfunction.  The left ventricular ejection fraction is 45-50% by visual estimate.  LV end diastolic pressure is moderately elevated.  Mid RCA to Dist RCA lesion, 100 %stenosed.  A STENT PROMUS PREM MR 3.0X20 drug eluting stent was successfully placed.  Post intervention, there is a 0% residual stenosis.  Mid RCA lesion, 70 %stenosed.  A STENT PROMUS PREM MR 3.5X16 drug eluting stent was successfully placed, and does not overlap previously placed stent.  Post intervention, there is a 0%  residual stenosis.   1. 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 Mild LV dysfunction 3. Elevated LVEDP 4. Successful PCI of the mid and distal RCA with DES x 2.   Venous Ultrasound 04/19/2017 IMPRESSION: 1. No evidence of superficial venous insufficiency. 2. No evidence of deep or superficial venous thrombosis. Signed,   ASSESSMENT AND PLAN:  1. Coronary artery disease: History of drug-eluting stent to the right coronary artery. Residual stenosis of the mid RCA. The patient has no recurrent chest pain. His main complaint is dyspnea on exertion and fluid retention. I have reviewed his echocardiogram which revealed normal LV systolic function. He will continue on aspirin, Brilinta, and atorvastatin therapy. He does not have prescription for nitroglycerin but states that he has no need for it at this time.  2. Chronic dyspnea on exertion: I believe this is multifactorial. Likely related to obesity, ongoing tobacco abuse, questionable obstructive sleep apnea. The patient has never had a sleep study completed. He states he awakes several times during the night unable to breathe. I'm going to set him up for a sleep study to evaluate for obstructive sleep apnea. He is to continue to use his inhaler during the day as prescribed by PCP.  I'm also ordering PFTs to evaluate his lung function. Echocardiogram did not show evidence of pulmonary hypertension. We will see if he would respond to further therapy if PFTs are abnormal. Would recommend referral to pulmonology but will defer to PCP to do so if clinically warranted.  3. Ongoing tobacco abuse: The patient continues to smoke. I have again reinforced the need to stop smoking with known history of CAD and high likelihood of progression if he continues this happened. He verbalizes understanding  4. Chronic lower extremity edema: I'm giving him a prescription for low-dose diuretic, HCTZ 12.5 mg  to use when necessary. I told her not take more than one a day. I want to avoid dehydration, hypotension, and hypokalemia.  Explained this to him. He verbalizes understanding   Current medicines are reviewed at length with the patient today.    Labs/ tests ordered today include:  Bettey Mare. Liborio Nixon, ANP, AACC   05/27/2017 2:38 PM    [facility] [address] [place], [place] [address] Phone: [phone]; Fax: [phone]

## 2017-06-01 ENCOUNTER — Ambulatory Visit (HOSPITAL_COMMUNITY)
Admission: RE | Admit: 2017-06-01 | Discharge: 2017-06-01 | Disposition: A | Discharge status: Home / Self Care | Payer: BLUE CROSS/BLUE SHIELD | Source: Ambulatory Visit | Attending: Adult Health | Admitting: Adult Health

## 2017-06-01 DIAGNOSIS — G4700 Insomnia, unspecified: Secondary | ICD-10-CM | POA: Insufficient documentation

## 2017-06-01 DIAGNOSIS — R0609 Other forms of dyspnea: Secondary | ICD-10-CM | POA: Insufficient documentation

## 2017-06-01 MED ORDER — ALBUTEROL SULFATE (2.5 MG/3ML) 0.083% IN NEBU
2.5000 mg | INHALATION_SOLUTION | Freq: Once | RESPIRATORY_TRACT | Status: AC
  Administered 2017-06-01: 2.5 mg via RESPIRATORY_TRACT

## 2017-06-02 LAB — PULMONARY FUNCTION TEST
DL/VA % pred: 76 %
DL/VA: 3.61 ml/min/mmHg/L
DLCO COR: 18.85 ml/min/mmHg
DLCO UNC % PRED: 53 %
DLCO UNC: 18.85 ml/min/mmHg
DLCO cor % pred: 53 %
FEF 25-75 POST: 2.31 L/s
FEF 25-75 PRE: 2.21 L/s
FEF2575-%Change-Post: 4 %
FEF2575-%PRED-PRE: 61 %
FEF2575-%Pred-Post: 63 %
FEV1-%Change-Post: 1 %
FEV1-%PRED-POST: 70 %
FEV1-%PRED-PRE: 69 %
FEV1-POST: 2.92 L
FEV1-Pre: 2.88 L
FEV1FVC-%CHANGE-POST: 1 %
FEV1FVC-%Pred-Pre: 94 %
FEV6-%CHANGE-POST: 0 %
FEV6-%PRED-PRE: 74 %
FEV6-%Pred-Post: 75 %
FEV6-POST: 3.9 L
FEV6-Pre: 3.86 L
FEV6FVC-%Change-Post: 0 %
FEV6FVC-%PRED-POST: 104 %
FEV6FVC-%Pred-Pre: 103 %
FVC-%Change-Post: 0 %
FVC-%Pred-Post: 72 %
FVC-%Pred-Pre: 72 %
FVC-PRE: 3.89 L
FVC-Post: 3.9 L
POST FEV6/FVC RATIO: 100 %
PRE FEV1/FVC RATIO: 74 %
Post FEV1/FVC ratio: 75 %
Pre FEV6/FVC Ratio: 99 %
RV % pred: 81 %
RV: 1.77 L
TLC % PRED: 74 %
TLC: 5.5 L

## 2017-06-03 ENCOUNTER — Telehealth: Payer: Self-pay | Admitting: *Deleted

## 2017-06-03 DIAGNOSIS — R942 Abnormal results of pulmonary function studies: Secondary | ICD-10-CM

## 2017-06-03 NOTE — Telephone Encounter (Signed)
-----   Message from Jodelle Gross, NP sent at 06/03/2017  7:12 AM EDT ----- PFT's show reduced lung function. Likely due to his smoking. Also reduced lung capacity for air movement. Please refer to Dr. Juanetta Gosling for more recommendation. He needs to stop smoking immediately. Lungs will worsen considerably if he does not.

## 2017-06-12 NOTE — Progress Notes (Signed)
Cardiology Office Note   Date:  06/14/2017   ID:  Chad Gillespie, DOB 1966-01-30, MRN [id number]   PCP:  Loletta Specter, PA-C  Cardiologist:   Rollene Rotunda, MD    Chief Complaint  Patient presents with  . Coronary Artery Disease      History of Present Illness: Chad Gillespie is a 51 y.o. male who presents for follow up of CAD.  He had CAD with 100% occlusion of the RCA.  He had stent placement.   He has been complaining of dyspnea and had PFTs.  He was to have a sleep apnea study.  He was to see Dr. Juanetta Gosling for pulmonary evaluation.  He is scheduled for a sleep study.  He denies any ongoing chest pain.  He has no symptoms similar to her previous angina.  However, he does have dyspnea.  The PFT which I reviewed today demonstrated mild restriction in lung capacity and moderately reduced DLCO.  He has some mild leg edema.  He sleeps at times in a chair.  He doesn't add salt but it is clear that his diet is not good.  He tires to eat healthier take out when he eats out. He tries to have [place] food.  He still smokes a few cigs per day.  He works but it doesn't sound like he exercises.    Past Medical History:  Diagnosis Date  . MI (myocardial infarction) (HCC)   . Tobacco abuse     Past Surgical History:  Procedure Laterality Date  . CORONARY STENT INTERVENTION N/A 02/04/2017   Procedure: Coronary Stent Intervention;  Surgeon: Peter M Swaziland, MD;  Location: [facility] INVASIVE CV LAB;  Service: Cardiovascular;  Laterality: N/A;  . ESOPHAGOGASTRODUODENOSCOPY (EGD) WITH ESOPHAGEAL DILATION    . LEFT HEART CATH AND CORONARY ANGIOGRAPHY N/A 02/04/2017   Procedure: Left Heart Cath and Coronary Angiography;  Surgeon: Peter M Swaziland, MD;  Location: [facility] INVASIVE CV LAB;  Service: Cardiovascular;  Laterality: N/A;     Current Outpatient Prescriptions  Medication Sig Dispense Refill  . albuterol (PROVENTIL HFA;VENTOLIN HFA) 108 (90 Base) MCG/ACT inhaler Inhale 2 puffs into the lungs every 6  (six) hours as needed for wheezing or shortness of breath. 1 Inhaler 2  . aspirin 81 MG chewable tablet Chew 1 tablet (81 mg total) by mouth daily. 30 tablet 1  . atorvastatin (LIPITOR) 80 MG tablet Take 1 tablet (80 mg total) by mouth daily at 6 PM. 30 tablet 6  . ticagrelor (BRILINTA) 90 MG TABS tablet Take 1 tablet (90 mg total) by mouth 2 (two) times daily. 60 tablet 6  . chlorthalidone (HYGROTON) 25 MG tablet Take 1 tablet (25 mg total) by mouth daily. 90 tablet 3   No current facility-administered medications for this visit.     Allergies:   Penicillins    ROS:  Please see the history of present illness.   Otherwise, review of systems are positive for none.   All other systems are reviewed and negative.    PHYSICAL EXAM: VS:  BP (!) 137/92   Pulse 90   Ht 6' (1.829 m)   Wt (!) 323 lb 6.4 oz (146.7 kg)   BMI 43.86 kg/m  , BMI Body mass index is 43.86 kg/m. GENERAL:  Well appearing NECK:  No jugular venous distention, waveform within normal limits, carotid upstroke brisk and symmetric, no bruits, no thyromegaly LUNGS:  Clear to auscultation bilaterally BACK:  No CVA tenderness CHEST:  Unremarkable HEART:  PMI not displaced or sustained,S1 and S2 within normal limits, no S3, no S4, no clicks, no rubs, no murmurs ABD:  Flat, positive bowel sounds normal in frequency in pitch, no bruits, no rebound, no guarding, no midline pulsatile mass, no hepatomegaly, no splenomegaly EXT:  2 plus pulses throughout, mild edema, no cyanosis no clubbing   EKG:  EKG is not ordered today.   Recent Labs: 03/22/2017: BUN 12; Creatinine, Ser 1.08; Hemoglobin 15.1; Platelets 282; Potassium 4.2; Sodium 141; TSH 1.540 06/13/2017: ALT 23    Lipid Panel    Component Value Date/Time   CHOL 119 06/13/2017 1057   TRIG 101 06/13/2017 1057   HDL 28 (L) 06/13/2017 1057   CHOLHDL 4.3 06/13/2017 1057   CHOLHDL 5.3 02/04/2017 0933   VLDL 17 02/04/2017 0933   LDLCALC 71 06/13/2017 1057      Wt  Readings from Last 3 Encounters:  06/13/17 (!) 323 lb 6.4 oz (146.7 kg)  05/27/17 (!) 322 lb 1.6 oz (146.1 kg)  04/25/17 (!) 318 lb 12.8 oz (144.6 kg)      Other studies Reviewed: Additional studies/ records that were reviewed today include: PFTs. Review of the above records demonstrates:  Please see elsewhere in the note.     ASSESSMENT AND PLAN:   CAD:  The patient has no new sypmtoms.  No further cardiovascular testing is indicated.  We will continue with aggressive risk reduction and meds as listed.  Of note I did review the hospital records to understand why he was not on a beta blocker.  He had bradycardia.  I agree with sleep study and therapy as needed.  No further testing.    DYSPNEA:    PFTs demonstrated mild disease.  I suspect his dyspnea is multifactorial with morbid obesity and deconditioning as part of the etiology. I will set him up to see Dr. Juanetta Gosling.    TOBACCO ABUSE:  He needs to stop completely.   EDEMA:    I will change to chlorthalidone 25 mg.    HTN:  BP above target.  Start chlorthalidone.  DYSLIPIDEMIA:  I will repeat lipid and liver today.    Current medicines are reviewed at length with the patient today.  The patient does not have concerns regarding medicines.  The following changes have been made:  no change  Labs/ tests ordered today include: None  Orders Placed This Encounter  Procedures  . Lipid panel  . Hepatic function panel  . Ambulatory referral to Pulmonology     Disposition:   FU with Joni Reining    Signed, Rollene Rotunda, MD  06/14/2017 9:06 PM    [facility]

## 2017-06-13 ENCOUNTER — Ambulatory Visit (INDEPENDENT_AMBULATORY_CARE_PROVIDER_SITE_OTHER): Payer: BLUE CROSS/BLUE SHIELD | Admitting: Cardiology

## 2017-06-13 ENCOUNTER — Encounter: Payer: Self-pay | Admitting: Cardiology

## 2017-06-13 VITALS — BP 137/92 | HR 90 | Ht 72.0 in | Wt 323.4 lb

## 2017-06-13 DIAGNOSIS — R942 Abnormal results of pulmonary function studies: Secondary | ICD-10-CM

## 2017-06-13 DIAGNOSIS — I2510 Atherosclerotic heart disease of native coronary artery without angina pectoris: Secondary | ICD-10-CM

## 2017-06-13 DIAGNOSIS — M7989 Other specified soft tissue disorders: Secondary | ICD-10-CM

## 2017-06-13 DIAGNOSIS — Z79899 Other long term (current) drug therapy: Secondary | ICD-10-CM

## 2017-06-13 DIAGNOSIS — E785 Hyperlipidemia, unspecified: Secondary | ICD-10-CM

## 2017-06-13 DIAGNOSIS — I10 Essential (primary) hypertension: Secondary | ICD-10-CM

## 2017-06-13 LAB — HEPATIC FUNCTION PANEL
ALT: 23 IU/L (ref 0–44)
AST: 21 IU/L (ref 0–40)
Albumin: 4.2 g/dL (ref 3.5–5.5)
Alkaline Phosphatase: 127 IU/L — ABNORMAL HIGH (ref 39–117)
Bilirubin Total: 0.3 mg/dL (ref 0.0–1.2)
Bilirubin, Direct: 0.1 mg/dL (ref 0.00–0.40)
Total Protein: 7.1 g/dL (ref 6.0–8.5)

## 2017-06-13 LAB — LIPID PANEL
Chol/HDL Ratio: 4.3 ratio (ref 0.0–5.0)
Cholesterol, Total: 119 mg/dL (ref 100–199)
HDL: 28 mg/dL — AB (ref >39)
LDL CALC: 71 mg/dL (ref 0–99)
TRIGLYCERIDES: 101 mg/dL (ref 0–149)
VLDL CHOLESTEROL CAL: 20 mg/dL (ref 5–40)

## 2017-06-13 MED ORDER — CHLORTHALIDONE 25 MG PO TABS: 25.0000 mg | ORAL_TABLET | Freq: Every day | ORAL | 3 refills | Status: DC

## 2017-06-13 NOTE — Patient Instructions (Signed)
Medication Instructions:  STOP- Hydrochlorothiazide START- Chlorthalidone 25 mg daily  Labwork: Fasting Lipid Liver today  Testing/Procedures: None Ordered  Follow-Up: You have been referred to Dr Mikey Bussing Pulmonology   Any Other Special Instructions Will Be Listed Below (If Applicable).   If you need a refill on your cardiac medications before your next appointment, please call your pharmacy.

## 2017-06-14 ENCOUNTER — Ambulatory Visit: Payer: BLUE CROSS/BLUE SHIELD | Attending: Adult Health | Admitting: Neurology

## 2017-06-14 ENCOUNTER — Encounter: Payer: Self-pay | Admitting: Cardiology

## 2017-06-14 DIAGNOSIS — I2510 Atherosclerotic heart disease of native coronary artery without angina pectoris: Secondary | ICD-10-CM | POA: Diagnosis not present

## 2017-06-14 DIAGNOSIS — Z720 Tobacco use: Secondary | ICD-10-CM | POA: Insufficient documentation

## 2017-06-14 DIAGNOSIS — G4733 Obstructive sleep apnea (adult) (pediatric): Secondary | ICD-10-CM | POA: Insufficient documentation

## 2017-06-14 DIAGNOSIS — R0600 Dyspnea, unspecified: Secondary | ICD-10-CM | POA: Diagnosis present

## 2017-06-14 DIAGNOSIS — Z9861 Coronary angioplasty status: Secondary | ICD-10-CM | POA: Insufficient documentation

## 2017-06-14 DIAGNOSIS — G4700 Insomnia, unspecified: Secondary | ICD-10-CM | POA: Insufficient documentation

## 2017-06-14 DIAGNOSIS — M7989 Other specified soft tissue disorders: Secondary | ICD-10-CM | POA: Insufficient documentation

## 2017-06-14 DIAGNOSIS — I10 Essential (primary) hypertension: Secondary | ICD-10-CM | POA: Insufficient documentation

## 2017-06-14 DIAGNOSIS — E785 Hyperlipidemia, unspecified: Secondary | ICD-10-CM | POA: Insufficient documentation

## 2017-06-14 DIAGNOSIS — Z79899 Other long term (current) drug therapy: Secondary | ICD-10-CM | POA: Insufficient documentation

## 2017-06-14 DIAGNOSIS — R0609 Other forms of dyspnea: Secondary | ICD-10-CM

## 2017-06-14 DIAGNOSIS — R942 Abnormal results of pulmonary function studies: Secondary | ICD-10-CM | POA: Insufficient documentation

## 2017-06-20 NOTE — Procedures (Signed)
[place] A. Merlene Laughter, MD     [email or web address]             NOCTURNAL POLYSOMNOGRAPHY   LOCATION: [facility]  Patient Name: Chad Gillespie, Chad Gillespie Date: 06/14/2017 Gender: Male D.O.B: Jan 04, 1966 Age (years): 78 Referring Provider: Jory Sims NP Height (inches): 72 Interpreting Physician: Phillips Odor MD, ABSM Weight (lbs): 323 RPSGT: Rosebud Poles BMI: 44 MRN: [id number] Neck Size: 21.00 CLINICAL INFORMATION Sleep Study Type: Split Night CPAP  Indication for sleep study: N/A  Epworth Sleepiness Score: 10  SLEEP STUDY TECHNIQUE As per the AASM Manual for the Scoring of Sleep and Associated Events v2.3 (April 2016) with a hypopnea requiring 4% desaturations.  The channels recorded and monitored were frontal, central and occipital EEG, electrooculogram (EOG), submentalis EMG (chin), nasal and oral airflow, thoracic and abdominal wall motion, anterior tibialis EMG, snore microphone, electrocardiogram, and pulse oximetry. Continuous positive airway pressure (CPAP) was initiated when the patient met split night criteria and was titrated according to treat sleep-disordered breathing.  MEDICATIONS Medications self-administered by patient taken the night of the study : N/A  Current Outpatient Prescriptions:  .  albuterol (PROVENTIL HFA;VENTOLIN HFA) 108 (90 Base) MCG/ACT inhaler, Inhale 2 puffs into the lungs every 6 (six) hours as needed for wheezing or shortness of breath., Disp: 1 Inhaler, Rfl: 2 .  aspirin 81 MG chewable tablet, Chew 1 tablet (81 mg total) by mouth daily., Disp: 30 tablet, Rfl: 1 .  atorvastatin (LIPITOR) 80 MG tablet, Take 1 tablet (80 mg total) by mouth daily at 6 PM., Disp: 30 tablet, Rfl: 6 .  chlorthalidone (HYGROTON) 25 MG tablet, Take 1 tablet (25 mg total) by mouth daily., Disp: 90 tablet, Rfl: 3 .  ticagrelor (BRILINTA) 90 MG TABS tablet, Take 1 tablet (90 mg total) by mouth 2 (two) times daily., Disp: 60 tablet, Rfl:  6   RESPIRATORY PARAMETERS Diagnostic  Total AHI (/hr): 95.3 RDI (/hr): 95.3 OA Index (/hr): 17 CA Index (/hr): 0.0 REM AHI (/hr): 120.0 NREM AHI (/hr): 94.1 Supine AHI (/hr): 104.7 Non-supine AHI (/hr): 93.59 Min O2 Sat (%): 75.00 Mean O2 (%): 91.11 Time below 88% (min): 31.3   Titration: The patient was titrated between pressures of 5-20. Higher pressures tended to results in the emergence of central sleep apnea.  Optimal Pressure (cm): 15 AHI at Optimal Pressure (/hr): 0.0 Min O2 at Optimal Pressure (%): 89.0 Supine % at Optimal (%): 0 Sleep % at Optimal (%): 98   SLEEP ARCHITECTURE The recording time for the entire night was 366.7 minutes.  During a baseline period of 137.0 minutes, the patient slept for 116.5 minutes in REM and nonREM, yielding a sleep efficiency of 85.0%. Sleep onset after lights out was 1.3 minutes with a REM latency of 71.5 minutes. The patient spent 6.87% of the night in stage N1 sleep, 88.41% in stage N2 sleep, 0.00% in stage N3 and 4.72% in REM.  During the titration period of 212.6 minutes, the patient slept for 189.5 minutes in REM and nonREM, yielding a sleep efficiency of 89.1%. Sleep onset after CPAP initiation was 1.3 minutes with a REM latency of 16.5 minutes. The patient spent 1.06% of the night in stage N1 sleep, 14.78% in stage N2 sleep, 50.13% in stage N3 and 34.04% in REM.  CARDIAC DATA The 2 lead EKG demonstrated sinus rhythm. The mean heart rate was N/A beats per minute. Other EKG findings include: PVCs. LEG MOVEMENT DATA The total Periodic Limb Movements of Sleep (PLMS) were 0. The  PLMS index was 0.00.  IMPRESSIONS - Severe obstructive sleep apnea occurred during the diagnostic portion of the study (AHI = 95.3/hour). An optimal CPAP of 15 ( 15 cm of water) is selected for this patient.    Delano Metz, MD Diplomate, American Board of Sleep Medicine.  ELECTRONICALLY SIGNED ON:  06/20/2017, 12:39 PM [place] PH:  [phone]   FX: [phone] [place]

## 2017-06-21 NOTE — Progress Notes (Signed)
Please refer this patient to Dr. Juanetta Gosling for CPAP and management due to severe obstructive sleep apnea per sleep study.   Thank you

## 2017-06-23 ENCOUNTER — Telehealth: Payer: Self-pay | Admitting: Adult Health

## 2017-06-23 NOTE — Telephone Encounter (Signed)
Spoke with pt. Let him know that he was referred to Dr. Juanetta Gosling. He stated he has an appointment in 9/13. I let him know that Dr. Juanetta Gosling will go over his results in detail.

## 2017-06-23 NOTE — Telephone Encounter (Signed)
Checking on results from sleep study

## 2017-06-30 ENCOUNTER — Telehealth: Payer: Self-pay | Admitting: *Deleted

## 2017-06-30 DIAGNOSIS — G4733 Obstructive sleep apnea (adult) (pediatric): Secondary | ICD-10-CM

## 2017-06-30 NOTE — Telephone Encounter (Signed)
-----   Message from Jodelle Gross, NP sent at 06/21/2017  4:45 PM EDT -----   ----- Message ----- From: Jodelle Gross, NP Sent: 06/20/2017   1:00 PM To: Jodelle Gross, NP, #

## 2019-10-23 ENCOUNTER — Encounter (INDEPENDENT_AMBULATORY_CARE_PROVIDER_SITE_OTHER): Payer: Self-pay | Admitting: Primary Care

## 2019-10-23 ENCOUNTER — Ambulatory Visit (INDEPENDENT_AMBULATORY_CARE_PROVIDER_SITE_OTHER): Payer: Medicaid Other | Admitting: Primary Care

## 2019-10-23 ENCOUNTER — Other Ambulatory Visit: Payer: Self-pay

## 2019-10-23 VITALS — BP 101/51 | HR 85 | Temp 97.9°F | Ht 72.0 in | Wt 319.8 lb

## 2019-10-23 DIAGNOSIS — J441 Chronic obstructive pulmonary disease with (acute) exacerbation: Secondary | ICD-10-CM

## 2019-10-23 DIAGNOSIS — F17210 Nicotine dependence, cigarettes, uncomplicated: Secondary | ICD-10-CM

## 2019-10-23 DIAGNOSIS — I10 Essential (primary) hypertension: Secondary | ICD-10-CM

## 2019-10-23 DIAGNOSIS — R7303 Prediabetes: Secondary | ICD-10-CM

## 2019-10-23 DIAGNOSIS — M2550 Pain in unspecified joint: Secondary | ICD-10-CM

## 2019-10-23 LAB — POCT GLYCOSYLATED HEMOGLOBIN (HGB A1C): Hemoglobin A1C: 6.1 % — AB (ref 4.0–5.6)

## 2019-10-23 MED ORDER — GABAPENTIN 100 MG PO CAPS: 100.0000 mg | ORAL_CAPSULE | Freq: Three times a day (TID) | ORAL | 1 refills | Status: DC

## 2019-10-23 MED ORDER — ALBUTEROL SULFATE HFA 108 (90 BASE) MCG/ACT IN AERS: 2.0000 | INHALATION_SPRAY | Freq: Four times a day (QID) | RESPIRATORY_TRACT | 1 refills | Status: DC | PRN

## 2019-10-23 NOTE — Progress Notes (Signed)
Established Patient Office Visit  Subjective:  Patient ID: Chad Gillespie, male    DOB: [birth year]  Age: 54 y.o. MRN: [id number]  CC:  Chief Complaint  Patient presents with  . Establish Care  . Referral    pulmonology     HPI Chad Gillespie presents for establishing care with new provider but established with [facility] has insurance and is able to avoid medication requesting medication refills. Blood pressure is unremarkable without medication. He denies, headaches, chest pain or lower extremity edema. Endorses shortness of breath and cough has COPD.  Past Medical History:  Diagnosis Date  . MI (myocardial infarction) ([place])   . Tobacco abuse     Past Surgical History:  Procedure Laterality Date  . CORONARY STENT INTERVENTION N/A 02/04/2017   Procedure: Coronary Stent Intervention;  Surgeon: [name], MD;  Location: [place] [facility];  Service: Cardiovascular;  Laterality: N/A;  . ESOPHAGOGASTRODUODENOSCOPY (EGD) WITH ESOPHAGEAL DILATION    . LEFT HEART CATH AND CORONARY ANGIOGRAPHY N/A 02/04/2017   Procedure: Left Heart Cath and Coronary Angiography;  Surgeon: [name], MD;  Location: [place] [facility];  Service: Cardiovascular;  Laterality: N/A;    Family History  Problem Relation Age of Onset  . Hypertension Mother   . Cancer Father   . Heart attack Father   . Diabetes Father   . Diabetes Sister   . Cancer Sister     Social History   Socioeconomic History  . Marital status: Single    Spouse name: Not on file  . Number of children: Not on file  . Years of education: Not on file  . Highest education level: Not on file  Occupational History  . Not on file  Tobacco Use  . Smoking status: Current Every Day Smoker    Packs/day: 0.10    Types: Cigarettes    Start date: 04/11/1986  . Smokeless tobacco: Never Used  Substance and Sexual Activity  . Alcohol use: Yes    Comment: occasionally   . Drug use: No  . Sexual activity: Not on file  Other Topics  Concern  . Not on file  Social History Narrative  . Not on file   Social Determinants of Health   Financial Resource Strain:   . Difficulty of Paying Living Expenses: Not on file  Food Insecurity:   . Worried About Charity fundraiser in the Last Year: Not on file  . Ran Out of Food in the Last Year: Not on file  Transportation Needs:   . Lack of Transportation (Medical): Not on file  . Lack of Transportation (Non-Medical): Not on file  Physical Activity:   . Days of Exercise per Week: Not on file  . Minutes of Exercise per Session: Not on file  Stress:   . Feeling of Stress : Not on file  Social Connections:   . Frequency of Communication with Friends and Family: Not on file  . Frequency of Social Gatherings with Friends and Family: Not on file  . Attends Religious Services: Not on file  . Active Member of Clubs or Organizations: Not on file  . Attends Archivist Meetings: Not on file  . Marital Status: Not on file  Intimate Partner Violence:   . Fear of Current or Ex-Partner: Not on file  . Emotionally Abused: Not on file  . Physically Abused: Not on file  . Sexually Abused: Not on file    Outpatient Medications Prior to  Visit  Medication Sig Dispense Refill  . aspirin 81 MG chewable tablet Chew 1 tablet (81 mg total) by mouth daily. (Patient not taking: Reported on 10/23/2019) 30 tablet 1  . atorvastatin (LIPITOR) 80 MG tablet Take 1 tablet (80 mg total) by mouth daily at 6 PM. (Patient not taking: Reported on 10/23/2019) 30 tablet 6  . chlorthalidone (HYGROTON) 25 MG tablet Take 1 tablet (25 mg total) by mouth daily. (Patient not taking: Reported on 10/23/2019) 90 tablet 3  . ticagrelor (BRILINTA) 90 MG TABS tablet Take 1 tablet (90 mg total) by mouth 2 (two) times daily. (Patient not taking: Reported on 10/23/2019) 60 tablet 6  . albuterol (PROVENTIL HFA;VENTOLIN HFA) 108 (90 Base) MCG/ACT inhaler Inhale 2 puffs into the lungs every 6 (six) hours as needed for wheezing  or shortness of breath. (Patient not taking: Reported on 10/23/2019) 1 Inhaler 2   No facility-administered medications prior to visit.    Allergies  Allergen Reactions  . Penicillins Hives    Has patient had a PCN reaction causing immediate rash, facial/tongue/throat swelling, SOB or lightheadedness with hypotension: Yes Has patient had a PCN reaction causing severe rash involving mucus membranes or skin necrosis: No Has patient had a PCN reaction that required hospitalization No Has patient had a PCN reaction occurring within the last 10 years: No If all of the above answers are "NO", then may proceed with Cephalosporin use.     ROS Review of Systems    Objective:    Physical Exam  Constitutional: He appears well-developed and well-nourished.  obesed  HENT:  Head: Normocephalic.  Eyes: Pupils are equal, round, and reactive to light. EOM are normal.  Cardiovascular: Normal rate and regular rhythm.  Pulmonary/Chest: Effort normal and breath sounds normal.  Abdominal: Soft. Bowel sounds are normal.  Musculoskeletal:     Cervical back: Normal range of motion and neck supple.    BP (!) 101/51 (BP Location: Left Arm, Patient Position: Sitting, Cuff Size: Large)   Pulse 85   Temp 97.9 F (36.6 C) (Oral)   Ht 6' (1.829 m)   Wt (!) 319 lb 12.8 oz (145.1 kg)   SpO2 94%   BMI 43.37 kg/m  Wt Readings from Last 3 Encounters:  10/23/19 (!) 319 lb 12.8 oz (145.1 kg)  06/13/17 (!) 323 lb 6.4 oz (146.7 kg)  05/27/17 (!) 322 lb 1.6 oz (146.1 kg)     Health Maintenance Due  Topic Date Due  . COLONOSCOPY  04/11/2016    There are no preventive care reminders to display for this patient.  Lab Results  Component Value Date   TSH 1.540 03/22/2017   Lab Results  Component Value Date   WBC 10.6 03/22/2017   HGB 15.1 03/22/2017   HCT 45.5 03/22/2017   MCV 89 03/22/2017   PLT 282 03/22/2017   Lab Results  Component Value Date   NA 141 03/22/2017   K 4.2 03/22/2017   CO2  28 03/22/2017   GLUCOSE 86 03/22/2017   BUN 12 03/22/2017   CREATININE 1.08 03/22/2017   BILITOT 0.3 06/13/2017   ALKPHOS 127 (H) 06/13/2017   AST 21 06/13/2017   ALT 23 06/13/2017   PROT 7.1 06/13/2017   ALBUMIN 4.2 06/13/2017   CALCIUM 10.0 03/22/2017   ANIONGAP 7 02/25/2017   Lab Results  Component Value Date   CHOL 119 06/13/2017   Lab Results  Component Value Date   HDL 28 (L) 06/13/2017   Lab Results  Component  Value Date   LDLCALC 71 06/13/2017   Lab Results  Component Value Date   TRIG 101 06/13/2017   Lab Results  Component Value Date   CHOLHDL 4.3 06/13/2017   Lab Results  Component Value Date   HGBA1C 6.1 (A) 10/23/2019      Assessment & Plan:  Saquan was seen today for establish care and referral.  Diagnoses and all orders for this visit:  Prediabetes - Exercise at least 5 times a week for 30 minutes or preferably daily.  - No Smoking - Drink less than 2 drinks a day.  - Monitor your feet for sores - Have yearly Eye Exams - Recommend annual Flu vaccine  - Recommend Pneumovax and Prevnar vaccines - Shingles Vaccine (Zostavax) if over 91 y.o. Goals:  - BMI less than 24 - Fasting sugar less than 130 or less than 150 if tapering medicines to lose weight  - Systolic BP less than 277  - Diastolic BP less than 80 - Bad LDL Cholesterol less than 70 - Triglycerides less than 150 . -     HgB A1c 6.1 -     CBC with Differential  Morbid obesity (HCC) Morbid Obesity is >/= 40  indicating an excess in caloric intake or underlining conditions. This may lead to other co-morbidities -increase respiratory complications and  .CVD.  Lifestyle modifications of diet and exercise may reduce obesity. -     Lipid Panel  COPD exacerbation ([place]) -     Ambulatory referral to Pulmonology  Essential hypertension Blood pressure presents today hypotensive on no blood pressure medication. Last seen by cardiology 2018 by Dr. [name].  -     CMP14+EGFR;  Future -     CMP14+EGFR  Arthralgia, unspecified joint -     Rheumatoid Arthritis Profile  Other orders -     gabapentin (NEURONTIN) 100 MG capsule; Take 1 capsule (100 mg total) by mouth 3 (three) times daily. -     Discontinue: albuterol (VENTOLIN HFA) 108 (90 Base) MCG/ACT inhaler; Inhale 2 puffs into the lungs every 6 (six) hours as needed for wheezing or shortness of breath. -     albuterol (VENTOLIN HFA) 108 (90 Base) MCG/ACT inhaler; Inhale 2 puffs into the lungs every 6 (six) hours as needed for wheezing or shortness of breath.    Meds ordered this encounter  Medications  . gabapentin (NEURONTIN) 100 MG capsule    Sig: Take 1 capsule (100 mg total) by mouth 3 (three) times daily.    Dispense:  90 capsule    Refill:  1  . DISCONTD: albuterol (VENTOLIN HFA) 108 (90 Base) MCG/ACT inhaler    Sig: Inhale 2 puffs into the lungs every 6 (six) hours as needed for wheezing or shortness of breath.    Dispense:  6.7 g    Refill:  1  . albuterol (VENTOLIN HFA) 108 (90 Base) MCG/ACT inhaler    Sig: Inhale 2 puffs into the lungs every 6 (six) hours as needed for wheezing or shortness of breath.    Dispense:  6.7 g    Refill:  1    Follow-up: Return in about 6 months (around 04/21/2020) for Blood pressure, weight, neurapathy .    [name], NP

## 2019-10-23 NOTE — Patient Instructions (Signed)

## 2019-10-25 LAB — CBC WITH DIFFERENTIAL/PLATELET
Basophils Absolute: 0.1 10*3/uL (ref 0.0–0.2)
Basos: 1 %
EOS (ABSOLUTE): 0.3 10*3/uL (ref 0.0–0.4)
Eos: 3 %
Hematocrit: 47.4 % (ref 37.5–51.0)
Hemoglobin: 16.8 g/dL (ref 13.0–17.7)
Immature Grans (Abs): 0.1 10*3/uL (ref 0.0–0.1)
Immature Granulocytes: 1 %
Lymphocytes Absolute: 2.8 10*3/uL (ref 0.7–3.1)
Lymphs: 28 %
MCH: 30.3 pg (ref 26.6–33.0)
MCHC: 35.4 g/dL (ref 31.5–35.7)
MCV: 86 fL (ref 79–97)
Monocytes Absolute: 0.8 10*3/uL (ref 0.1–0.9)
Monocytes: 8 %
Neutrophils Absolute: 5.8 10*3/uL (ref 1.4–7.0)
Neutrophils: 59 %
Platelets: 293 10*3/uL (ref 150–450)
RBC: 5.54 x10E6/uL (ref 4.14–5.80)
RDW: 12.3 % (ref 11.6–15.4)
WBC: 9.7 10*3/uL (ref 3.4–10.8)

## 2019-10-25 LAB — LIPID PANEL
Chol/HDL Ratio: 6.7 ratio — ABNORMAL HIGH (ref 0.0–5.0)
Cholesterol, Total: 202 mg/dL — ABNORMAL HIGH (ref 100–199)
HDL: 30 mg/dL — ABNORMAL LOW (ref >39)
LDL Chol Calc (NIH): 154 mg/dL — ABNORMAL HIGH (ref 0–99)
Triglycerides: 100 mg/dL (ref 0–149)
VLDL Cholesterol Cal: 18 mg/dL (ref 5–40)

## 2019-10-25 LAB — CMP14+EGFR
ALT: 23 IU/L (ref 0–44)
AST: 16 IU/L (ref 0–40)
Albumin/Globulin Ratio: 1.4 (ref 1.2–2.2)
Albumin: 4.2 g/dL (ref 3.8–4.9)
Alkaline Phosphatase: 122 IU/L — ABNORMAL HIGH (ref 39–117)
BUN/Creatinine Ratio: 12 (ref 9–20)
BUN: 11 mg/dL (ref 6–24)
Bilirubin Total: 0.3 mg/dL (ref 0.0–1.2)
CO2: 22 mmol/L (ref 20–29)
Calcium: 10 mg/dL (ref 8.7–10.2)
Chloride: 103 mmol/L (ref 96–106)
Creatinine, Ser: 0.92 mg/dL (ref 0.76–1.27)
GFR calc Af Amer: 109 mL/min/{1.73_m2} (ref >59)
GFR calc non Af Amer: 95 mL/min/{1.73_m2} (ref >59)
Globulin, Total: 3 g/dL (ref 1.5–4.5)
Glucose: 91 mg/dL (ref 65–99)
Potassium: 4.2 mmol/L (ref 3.5–5.2)
Sodium: 139 mmol/L (ref 134–144)
Total Protein: 7.2 g/dL (ref 6.0–8.5)

## 2019-10-25 LAB — RHEUMATOID ARTHRITIS PROFILE
Cyclic Citrullin Peptide Ab: 9 units (ref 0–19)
Rhuematoid fact SerPl-aCnc: <10 IU/mL (ref 0.0–13.9)

## 2019-11-14 ENCOUNTER — Encounter (INDEPENDENT_AMBULATORY_CARE_PROVIDER_SITE_OTHER): Payer: Self-pay | Admitting: Primary Care

## 2019-11-14 ENCOUNTER — Ambulatory Visit (INDEPENDENT_AMBULATORY_CARE_PROVIDER_SITE_OTHER): Payer: Medicaid Other | Admitting: Primary Care

## 2019-11-14 ENCOUNTER — Other Ambulatory Visit: Payer: Self-pay

## 2019-11-14 DIAGNOSIS — R079 Chest pain, unspecified: Secondary | ICD-10-CM

## 2019-11-14 DIAGNOSIS — Z720 Tobacco use: Secondary | ICD-10-CM

## 2019-11-14 DIAGNOSIS — Z6841 Body Mass Index (BMI) 40.0 and over, adult: Secondary | ICD-10-CM

## 2019-11-14 DIAGNOSIS — G4733 Obstructive sleep apnea (adult) (pediatric): Secondary | ICD-10-CM

## 2019-11-14 DIAGNOSIS — I10 Essential (primary) hypertension: Secondary | ICD-10-CM | POA: Diagnosis not present

## 2019-11-14 MED ORDER — NICOTINE 14 MG/24HR TD PT24: 14.0000 mg | MEDICATED_PATCH | Freq: Every day | TRANSDERMAL | 0 refills | Status: DC

## 2019-11-14 MED ORDER — NICOTINE 21 MG/24HR TD PT24: 21.0000 mg | MEDICATED_PATCH | Freq: Every day | TRANSDERMAL | 0 refills | Status: DC

## 2019-11-14 MED ORDER — NICOTINE 7 MG/24HR TD PT24: 7.0000 mg | MEDICATED_PATCH | Freq: Every day | TRANSDERMAL | 0 refills | Status: DC

## 2019-11-14 NOTE — Progress Notes (Signed)
Established Patient Office Visit  Subjective:  Patient ID: Chad Gillespie, male    DOB: 21-Mar-1966  Age: 54 y.o. MRN: [id number]  CC:  Chief Complaint  Patient presents with  . Tinnitus  . Arm Pain    left arm     HPI Chad Gillespie presents for concerns with chest tightness that migrates down his left arm. Last night chest pains were so bad he started to go to the hospital but knew he had an appointment with PCP today. He is/was followed by cardiology last seen 05/27/2017.  Past Medical History:  Diagnosis Date  . MI (myocardial infarction) ([facility])   . Tobacco abuse     Past Surgical History:  Procedure Laterality Date  . CORONARY STENT INTERVENTION N/A 02/04/2017   Procedure: Coronary Stent Intervention;  Surgeon: Peter M Swaziland, MD;  Location: [facility];  Service: Cardiovascular;  Laterality: N/A;  . ESOPHAGOGASTRODUODENOSCOPY (EGD) WITH ESOPHAGEAL DILATION    . LEFT HEART CATH AND CORONARY ANGIOGRAPHY N/A 02/04/2017   Procedure: Left Heart Cath and Coronary Angiography;  Surgeon: Peter M Swaziland, MD;  Location: [facility];  Service: Cardiovascular;  Laterality: N/A;    Family History  Problem Relation Age of Onset  . Hypertension Mother   . Cancer Father   . Heart attack Father   . Diabetes Father   . Diabetes Sister   . Cancer Sister     Social History   Socioeconomic History  . Marital status: Single    Spouse name: Not on file  . Number of children: Not on file  . Years of education: Not on file  . Highest education level: Not on file  Occupational History  . Not on file  Tobacco Use  . Smoking status: Current Every Day Smoker    Packs/day: 0.10    Types: Cigarettes    Start date: 04/11/1986  . Smokeless tobacco: Never Used  Substance and Sexual Activity  . Alcohol use: Yes    Comment: occasionally   . Drug use: No  . Sexual activity: Not on file  Other Topics Concern  . Not on file  Social History Narrative  . Not on file    Social Determinants of Health   Financial Resource Strain:   . Difficulty of Paying Living Expenses: Not on file  Food Insecurity:   . Worried About Programme researcher, broadcasting/film/video in the Last Year: Not on file  . Ran Out of Food in the Last Year: Not on file  Transportation Needs:   . Lack of Transportation (Medical): Not on file  . Lack of Transportation (Non-Medical): Not on file  Physical Activity:   . Days of Exercise per Week: Not on file  . Minutes of Exercise per Session: Not on file  Stress:   . Feeling of Stress : Not on file  Social Connections:   . Frequency of Communication with Friends and Family: Not on file  . Frequency of Social Gatherings with Friends and Family: Not on file  . Attends Religious Services: Not on file  . Active Member of Clubs or Organizations: Not on file  . Attends Banker Meetings: Not on file  . Marital Status: Not on file  Intimate Partner Violence:   . Fear of Current or Ex-Partner: Not on file  . Emotionally Abused: Not on file  . Physically Abused: Not on file  . Sexually Abused: Not on file    Outpatient Medications Prior to Visit  Medication Sig Dispense Refill  . albuterol (VENTOLIN HFA) 108 (90 Base) MCG/ACT inhaler Inhale 2 puffs into the lungs every 6 (six) hours as needed for wheezing or shortness of breath. 6.7 g 1  . gabapentin (NEURONTIN) 100 MG capsule Take 1 capsule (100 mg total) by mouth 3 (three) times daily. 90 capsule 1  . aspirin 81 MG chewable tablet Chew 1 tablet (81 mg total) by mouth daily. (Patient not taking: Reported on 10/23/2019) 30 tablet 1  . atorvastatin (LIPITOR) 80 MG tablet Take 1 tablet (80 mg total) by mouth daily at 6 PM. (Patient not taking: Reported on 10/23/2019) 30 tablet 6  . chlorthalidone (HYGROTON) 25 MG tablet Take 1 tablet (25 mg total) by mouth daily. (Patient not taking: Reported on 10/23/2019) 90 tablet 3  . ticagrelor (BRILINTA) 90 MG TABS tablet Take 1 tablet (90 mg total) by mouth 2 (two)  times daily. (Patient not taking: Reported on 10/23/2019) 60 tablet 6   No facility-administered medications prior to visit.    Allergies  Allergen Reactions  . Penicillins Hives    Has patient had a PCN reaction causing immediate rash, facial/tongue/throat swelling, SOB or lightheadedness with hypotension: Yes Has patient had a PCN reaction causing severe rash involving mucus membranes or skin necrosis: No Has patient had a PCN reaction that required hospitalization No Has patient had a PCN reaction occurring within the last 10 years: No If all of the above answers are "NO", then may proceed with Cephalosporin use.     ROS Review of Systems  HENT: Positive for tinnitus.   Respiratory: Positive for chest tightness.   Cardiovascular: Positive for chest pain.  Musculoskeletal:       Arm pain left   All other systems reviewed and are negative.     Objective:    Physical Exam  Constitutional: He is oriented to person, place, and time. He appears well-developed and well-nourished.  Morbid obese  HENT:  Head: Normocephalic and atraumatic.  Eyes: Pupils are equal, round, and reactive to light. EOM are normal.  Cardiovascular: Normal rate and regular rhythm.  Pulmonary/Chest: Effort normal and breath sounds normal.  Abdominal: Soft. Bowel sounds are normal. He exhibits distension.  Musculoskeletal:        General: Normal range of motion.     Cervical back: Normal range of motion and neck supple.  Neurological: He is oriented to person, place, and time.  Skin: Skin is warm and dry.  Psychiatric: He has a normal mood and affect. His behavior is normal. Judgment and thought content normal.    BP 135/81 (BP Location: Right Arm, Patient Position: Sitting, Cuff Size: Large)   Pulse 94   Temp (!) 97.3 F (36.3 C) (Temporal)   Ht 6' (1.829 m)   Wt (!) 318 lb 6.4 oz (144.4 kg)   SpO2 94%   BMI 43.18 kg/m  Wt Readings from Last 3 Encounters:  11/14/19 (!) 318 lb 6.4 oz (144.4 kg)   10/23/19 (!) 319 lb 12.8 oz (145.1 kg)  06/13/17 (!) 323 lb 6.4 oz (146.7 kg)     Health Maintenance Due  Topic Date Due  . COLONOSCOPY  04/11/2016    There are no preventive care reminders to display for this patient.  Lab Results  Component Value Date   TSH 1.540 03/22/2017   Lab Results  Component Value Date   WBC 9.7 10/23/2019   HGB 16.8 10/23/2019   HCT 47.4 10/23/2019   MCV 86 10/23/2019   PLT  293 10/23/2019   Lab Results  Component Value Date   NA 139 10/23/2019   K 4.2 10/23/2019   CO2 22 10/23/2019   GLUCOSE 91 10/23/2019   BUN 11 10/23/2019   CREATININE 0.92 10/23/2019   BILITOT 0.3 10/23/2019   ALKPHOS 122 (H) 10/23/2019   AST 16 10/23/2019   ALT 23 10/23/2019   PROT 7.2 10/23/2019   ALBUMIN 4.2 10/23/2019   CALCIUM 10.0 10/23/2019   ANIONGAP 7 02/25/2017   Lab Results  Component Value Date   CHOL 202 (H) 10/23/2019   Lab Results  Component Value Date   HDL 30 (L) 10/23/2019   Lab Results  Component Value Date   LDLCALC 154 (H) 10/23/2019   Lab Results  Component Value Date   TRIG 100 10/23/2019   Lab Results  Component Value Date   CHOLHDL 6.7 (H) 10/23/2019   Lab Results  Component Value Date   HGBA1C 6.1 (A) 10/23/2019      Assessment & Plan:  Chad Gillespie was seen today for tinnitus and arm pain.  Diagnoses and all orders for this visit:  Morbid obesity (HCC) Morbid Obesity is >/= 40 indicating an excess in caloric intake or underlining conditions. This may lead to other co-morbidities examples are OSA, respiratory complication , risk for diabetes Lifestyle modifications of diet and exercise may reduce obesity.   Essential hypertension Counseled on blood pressure goal of less than 130/80, low-sodium, DASH diet, 150 minutes of moderate intensity exercise per week. Refer to cardiology   Chest pain, unspecified type Underlying conditions , OSA, tobacco abuse and morbid obesity. Previous cardiologist Dr. Rollene Rotunda   -  Ambulatory referral to Cardiology -     EKG 12-Lead normal results   Tobacco abuse Discussed increased risk for lung cancer and other respiratory diseases recommend cessation.  Patient agreed and will start on Nicoderm patches   OSA (obstructive sleep apnea) Patient presents with possible obstructive sleep apnea. Patent has a  history of symptoms of OSA. Patient generally gets 4 hours of sleep per night, and states they generally have {sleep qualit poor . Snoring of moderatley severity is present. Apneic episodes is present. Nasal obstruction is present.   -     Ambulatory referral to Pulmonology  Other orders -     nicotine (NICODERM CQ) 21 mg/24hr patch; Place 1 patch (21 mg total) onto the skin daily. -     nicotine (NICODERM CQ) 14 mg/24hr patch; Place 1 patch (14 mg total) onto the skin daily. -     nicotine (NICODERM CQ) 7 mg/24hr patch; Place 1 patch (7 mg total) onto the skin daily. -     nitroGLYCERIN (NITROSTAT) 0.4 MG SL tablet; Place 1 tablet (0.4 mg total) under the tongue every 5 (five) minutes as needed for chest pain.    Meds ordered this encounter  Medications  . nicotine (NICODERM CQ) 21 mg/24hr patch    Sig: Place 1 patch (21 mg total) onto the skin daily.    Dispense:  28 patch    Refill:  0  . nicotine (NICODERM CQ) 14 mg/24hr patch    Sig: Place 1 patch (14 mg total) onto the skin daily.    Dispense:  28 patch    Refill:  0  . nicotine (NICODERM CQ) 7 mg/24hr patch    Sig: Place 1 patch (7 mg total) onto the skin daily.    Dispense:  28 patch    Refill:  0  . nitroGLYCERIN (NITROSTAT) 0.4  MG SL tablet    Sig: Place 1 tablet (0.4 mg total) under the tongue every 5 (five) minutes as needed for chest pain.    Dispense:  50 tablet    Refill:  3    Follow-up: Return in about 3 months (around 02/12/2020) for follow up on Bp and specialty visit .    [name], NP

## 2019-11-14 NOTE — Patient Instructions (Signed)

## 2019-11-14 NOTE — Progress Notes (Signed)
Pt complains of chest tightness Pt believes he has fluid build up on the right side and sometimes the left as well

## 2019-11-24 ENCOUNTER — Encounter (INDEPENDENT_AMBULATORY_CARE_PROVIDER_SITE_OTHER): Payer: Self-pay | Admitting: Primary Care

## 2019-11-24 MED ORDER — NITROGLYCERIN 0.4 MG SL SUBL: 0.4000 mg | SUBLINGUAL_TABLET | SUBLINGUAL | 3 refills | Status: AC | PRN

## 2019-11-29 ENCOUNTER — Ambulatory Visit (INDEPENDENT_AMBULATORY_CARE_PROVIDER_SITE_OTHER): Payer: Medicaid Other | Admitting: Student

## 2019-11-29 ENCOUNTER — Encounter: Payer: Self-pay | Admitting: Student

## 2019-11-29 VITALS — BP 137/71 | HR 80 | Temp 97.5°F | Ht 72.0 in | Wt 318.0 lb

## 2019-11-29 DIAGNOSIS — G4730 Sleep apnea, unspecified: Secondary | ICD-10-CM | POA: Diagnosis not present

## 2019-11-29 DIAGNOSIS — Z720 Tobacco use: Secondary | ICD-10-CM | POA: Diagnosis not present

## 2019-11-29 DIAGNOSIS — E785 Hyperlipidemia, unspecified: Secondary | ICD-10-CM

## 2019-11-29 DIAGNOSIS — I2510 Atherosclerotic heart disease of native coronary artery without angina pectoris: Secondary | ICD-10-CM

## 2019-11-29 MED ORDER — ATORVASTATIN CALCIUM 80 MG PO TABS: 80.0000 mg | ORAL_TABLET | Freq: Every day | ORAL | 11 refills | Status: DC

## 2019-11-29 MED ORDER — ASPIRIN 81 MG PO CHEW: 81.0000 mg | CHEWABLE_TABLET | Freq: Every day | ORAL | 11 refills | Status: AC

## 2019-11-29 NOTE — Patient Instructions (Addendum)
Medication Instructions:  Your physician recommends that you continue on your current medications as directed. Please refer to the Current Medication list given to you today.  Restart Lipitor 80 mg Daily  Restart Aspirin 81 mg Daily   *If you need a refill on your cardiac medications before your next appointment, please call your pharmacy*  Lab Work: None  If you have labs (blood work) drawn today and your tests are completely normal, you will receive your results only by: Marland Kitchen MyChart Message (if you have MyChart) OR . A paper copy in the mail If you have any lab test that is abnormal or we need to change your treatment, we will call you to review the results.  Testing/Procedures: None   Follow-Up: At [facility], you and your health needs are our priority.  As part of our continuing mission to provide you with exceptional heart care, we have created designated Provider Care Teams.  These Care Teams include your primary Cardiologist (physician) and Advanced Practice Providers (APPs -  Physician Assistants and Nurse Practitioners) who all work together to provide you with the care you need, when you need it.  Your next appointment:   4 week(s)  The format for your next appointment:   In Person  Provider:   Randall An, PA-C  Other Instructions Thank you for choosing [facility]!

## 2019-11-29 NOTE — Progress Notes (Signed)
Cardiology Office Note    Date:  11/29/2019   ID:  Chad Gillespie, DOB [birth year], MRN [id number]  PCP:  [name], NP  Cardiologist: Rollene Rotunda, MD    Chief Complaint  Patient presents with  . Follow-up    Overdue Visit    History of Present Illness:    Chad Gillespie is a 54 y.o. male with past medical history of CAD (s/p NSTEMI in 01/2017 with DESx2 to RCA), HLD, COPD, obesity and tobacco use who presents to the office today for overdue follow-up.   He was last examined by Dr. Antoine Poche in 05/2017 and had been experiencing dyspnea and PFT's had been ordered by Pulmonology and demonstrated mild restriction and moderately reduced DLCO. Was scheduled for an upcoming sleep study (performed later that month and showed severe OSA). He was continued on ASA 81mg  daily, Brilinta 90mg  BID and Atorvastatin 80mg  daily with HCTZ being transitioned to Chlorthalidone 25mg  daily for improved BP control.   He was evaluated by his PCP on 11/14/2019 and reported episodes of chest tightness which would radiate down his left arm. An EKG was performed and showed NSR, HR 82 with inferior infarct pattern but no acute ST abnormalities when compared to prior tracings. He was referred back to Cardiology for further evaluation.   In talking with the patient today, he reports having stopped all of his medications in late 2018 and not following up in the interim as he lost insurance during that time frame. He has baseline dyspnea on exertion in the setting of COPD and was previously followed by Dr. for this along with his OSA. Denies any acute changes in his respiratory status. He has been without his CPAP for nearly two years and was referred to Pulmonology by his PCP last month but has not heard from the office. He reports PND episodes and snores regularly. No lower extremity edema.   Over the past few months, he does report occasional episodes of chest pain which have occurred at rest and  last for 15 minutes up to 1 hour and spontaneously resolve. Not associated with exertion. Feels different from his prior angina. He denies any associated dyspnea, nausea, vomiting or diaphoresis when the episodes occur.    Past Medical History:  Diagnosis Date  . CAD (coronary artery disease)    a. s/p NSTEMI in 01/2017 with DESx2 to RCA  . MI (myocardial infarction) (HCC)   . Tobacco abuse     Past Surgical History:  Procedure Laterality Date  . CORONARY STENT INTERVENTION N/A 02/04/2017   Procedure: Coronary Stent Intervention;  Surgeon: Peter M 2019, MD;  Location: [facility] INVASIVE CV LAB;  Service: Cardiovascular;  Laterality: N/A;  . ESOPHAGOGASTRODUODENOSCOPY (EGD) WITH ESOPHAGEAL DILATION    . LEFT HEART CATH AND CORONARY ANGIOGRAPHY N/A 02/04/2017   Procedure: Left Heart Cath and Coronary Angiography;  Surgeon: Peter M 02/06/2017, MD;  Location: [facility] INVASIVE CV LAB;  Service: Cardiovascular;  Laterality: N/A;    Current Medications: Outpatient Medications Prior to Visit  Medication Sig Dispense Refill  . albuterol (VENTOLIN HFA) 108 (90 Base) MCG/ACT inhaler Inhale 2 puffs into the lungs every 6 (six) hours as needed for wheezing or shortness of breath. 6.7 g 1  . gabapentin (NEURONTIN) 100 MG capsule Take 1 capsule (100 mg total) by mouth 3 (three) times daily. 90 capsule 1  . nicotine (NICODERM CQ) 14 mg/24hr patch Place 1 patch (14 mg total) onto the skin daily. 28 patch 0  .  nicotine (NICODERM CQ) 21 mg/24hr patch Place 1 patch (21 mg total) onto the skin daily. 28 patch 0  . nicotine (NICODERM CQ) 7 mg/24hr patch Place 1 patch (7 mg total) onto the skin daily. 28 patch 0  . nitroGLYCERIN (NITROSTAT) 0.4 MG SL tablet Place 1 tablet (0.4 mg total) under the tongue every 5 (five) minutes as needed for chest pain. 50 tablet 3  . aspirin 81 MG chewable tablet Chew 1 tablet (81 mg total) by mouth daily. (Patient not taking: Reported on 11/29/2019) 30 tablet 1  . atorvastatin (LIPITOR) 80 MG  tablet Take 1 tablet (80 mg total) by mouth daily at 6 PM. (Patient not taking: Reported on 11/29/2019) 30 tablet 6  . chlorthalidone (HYGROTON) 25 MG tablet Take 1 tablet (25 mg total) by mouth daily. (Patient not taking: Reported on 11/29/2019) 90 tablet 3  . ticagrelor (BRILINTA) 90 MG TABS tablet Take 1 tablet (90 mg total) by mouth 2 (two) times daily. (Patient not taking: Reported on 11/29/2019) 60 tablet 6   No facility-administered medications prior to visit.     Allergies:   Penicillins   Social History   Socioeconomic History  . Marital status: Single    Spouse name: Not on file  . Number of children: Not on file  . Years of education: Not on file  . Highest education level: Not on file  Occupational History  . Not on file  Tobacco Use  . Smoking status: Current Every Day Smoker    Packs/day: 0.10    Types: Cigarettes    Start date: 04/11/1986  . Smokeless tobacco: Never Used  Substance and Sexual Activity  . Alcohol use: Yes    Comment: occasionally   . Drug use: No  . Sexual activity: Not on file  Other Topics Concern  . Not on file  Social History Narrative  . Not on file   Social Determinants of Health   Financial Resource Strain:   . Difficulty of Paying Living Expenses: Not on file  Food Insecurity:   . Worried About Programme researcher, broadcasting/film/video in the Last Year: Not on file  . Ran Out of Food in the Last Year: Not on file  Transportation Needs:   . Lack of Transportation (Medical): Not on file  . Lack of Transportation (Non-Medical): Not on file  Physical Activity:   . Days of Exercise per Week: Not on file  . Minutes of Exercise per Session: Not on file  Stress:   . Feeling of Stress : Not on file  Social Connections:   . Frequency of Communication with Friends and Family: Not on file  . Frequency of Social Gatherings with Friends and Family: Not on file  . Attends Religious Services: Not on file  . Active Member of Clubs or Organizations: Not on file  .  Attends Banker Meetings: Not on file  . Marital Status: Not on file     Family History:  The patient's family history includes Cancer in his father and sister; Diabetes in his father and sister; Heart attack in his father; Hypertension in his mother.   Review of Systems:   Please see the history of present illness.     General:  No chills, fever, night sweats or weight changes.  Cardiovascular:  No edema, orthopnea, palpitations. Positive for chest pain, PND and dyspnea on exertion.  Dermatological: No rash, lesions/masses Respiratory: No cough, dyspnea Urologic: No hematuria, dysuria Abdominal:   No nausea, vomiting, diarrhea,  bright red blood per rectum, melena, or hematemesis Neurologic:  No visual changes, wkns, changes in mental status. All other systems reviewed and are otherwise negative except as noted above.   Physical Exam:    VS:  BP 137/71   Pulse 80   Temp (!) 97.5 F (36.4 C)   Ht 6' (1.829 m)   Wt (!) 318 lb (144.2 kg)   SpO2 94%   BMI 43.13 kg/m    General: Well developed, well nourished,male appearing in no acute distress. Head: Normocephalic, atraumatic, sclera non-icteric, no xanthomas, nares are without discharge.  Neck: No carotid bruits. JVD difficult to assess secondary to body habitus.   Lungs: Respirations regular and unlabored, without wheezes or rales.  Heart: Regular rate and rhythm. No S3 or S4.  No murmur, no rubs, or gallops appreciated. Abdomen: Soft, non-tender, non-distended with normoactive bowel sounds. No hepatomegaly. No rebound/guarding. No obvious abdominal masses. Msk:  Strength and tone appear normal for age. No joint deformities or effusions. Extremities: No clubbing or cyanosis. No lower extremity edema.  Distal pedal pulses are 2+ bilaterally. Neuro: Alert and oriented X 3. Moves all extremities spontaneously. No focal deficits noted. Psych:  Responds to questions appropriately with a normal affect. Skin: No rashes or  lesions noted  Wt Readings from Last 3 Encounters:  11/29/19 (!) 318 lb (144.2 kg)  11/14/19 (!) 318 lb 6.4 oz (144.4 kg)  10/23/19 (!) 319 lb 12.8 oz (145.1 kg)     Studies/Labs Reviewed:   EKG:  EKG is not ordered today. EKG from 11/14/2019 is reviewed which shows NSR, HR 82 with inferior infarct pattern but no acute ST abnormalities when compared to prior tracings.  Recent Labs: 10/23/2019: ALT 23; BUN 11; Creatinine, Ser 0.92; Hemoglobin 16.8; Platelets 293; Potassium 4.2; Sodium 139   Lipid Panel    Component Value Date/Time   CHOL 202 (H) 10/23/2019 1030   TRIG 100 10/23/2019 1030   HDL 30 (L) 10/23/2019 1030   CHOLHDL 6.7 (H) 10/23/2019 1030   CHOLHDL 5.3 02/04/2017 0933   VLDL 17 02/04/2017 0933   LDLCALC 154 (H) 10/23/2019 1030    Additional studies/ records that were reviewed today include:   Cardiac Catheterization: 01/2017  1st Mrg lesion, 50 %stenosed.  Prox Cx to Mid Cx lesion, 50 %stenosed.  There is mild left ventricular systolic dysfunction.  The left ventricular ejection fraction is 45-50% by visual estimate.  LV end diastolic pressure is moderately elevated.  Mid RCA to Dist RCA lesion, 100 %stenosed.  A STENT PROMUS PREM MR 3.0X20 drug eluting stent was successfully placed.  Post intervention, there is a 0% residual stenosis.  Mid RCA lesion, 70 %stenosed.  A STENT PROMUS PREM MR 3.5X16 drug eluting stent was successfully placed, and does not overlap previously placed stent.  Post intervention, there is a 0% residual stenosis.   1. 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 Mild LV dysfunction 3. Elevated LVEDP 4. Successful PCI of the mid and distal RCA with DES x 2.  Plan: DAPT for one year. Aggressive risk factor modification. If stable anticipate DC in am from a cardiac standpoint.   Echocardiogram: 01/2017 Study Conclusions   - Left ventricle: The cavity size was normal.  Wall thickness was  normal. Systolic function was normal. The estimated ejection  fraction was in the range of 55% to 60%. Wall motion was normal;  there were no regional wall motion  abnormalities. Doppler  parameters are consistent with abnormal left ventricular  relaxation (grade 1 diastolic dysfunction).   Assessment:    1. Coronary artery disease involving native coronary artery of native heart without angina pectoris   2. Hyperlipidemia LDL goal <70   3. Sleep apnea, unspecified type   4. Tobacco use      Plan:   In order of problems listed above:  1. CAD - s/p NSTEMI in 01/2017 with DESx2 to RCA. He has baseline dyspnea on exertion in the setting of COPD but this has overall been stable. He does describe occasional episodes of chest pain which have occurred at rest and feel different from his prior angina. Last episode over one week ago.  - he is at risk for progressive CAD since his last cath given continued tobacco use, elevated cholesterol and pre-diabetes. Reviewed options with the patient and he prefers we initiate medical therapy initially and assess response to this. Will plan to restart ASA 81mg  daily and Atorvastatin 80mg  daily. He does have an Rx for SL NTG and we reviewed how to use this and to present to the ED for urgent evaluation if using more than 3 during one episode. Will arrange for follow-up in several weeks and if he continues to experience symptoms, would plan to obtain a [place] for ischemic evaluation. I encouraged him to make [place] aware of any change in his symptoms in the interim as we would need to expedite evaluation.    2. HLD - FLP last month showed total cholesterol of 202, HDL 30 and LDL 154. He is in agreement to restarting Atorvastatin and tolerated this well in the past. Will restart Atorvastatin 80mg  daily and plan to obtain repeat FLP and LFT's in 6-8 weeks.  3. OSA - referral already entered to Pulmonology by his PCP but he has  not heard from them in regards to follow-up. Will re-enter referral today for OSA and COPD. Previously followed by Dr. Luan Pulling who is now retired.   4. Tobacco Use - he was previously smoking 4 ppd at the time of his MI in 2018. Now smoking less than 10 cigarettes daily and recently started on Nicoderm by his PCP.    Medication Adjustments/Labs and Tests Ordered: Current medicines are reviewed at length with the patient today.  Concerns regarding medicines are outlined above.  Medication changes, Labs and Tests ordered today are listed in the Patient Instructions below. Patient Instructions  Medication Instructions:  Your physician recommends that you continue on your current medications as directed. Please refer to the Current Medication list given to you today.  Restart Lipitor 80 mg Daily  Restart Aspirin 81 mg Daily   *If you need a refill on your cardiac medications before your next appointment, please call your pharmacy*  Lab Work: None  If you have labs (blood work) drawn today and your tests are completely normal, you will receive your results only by: Marland Kitchen MyChart Message (if you have MyChart) OR . A paper copy in the mail If you have any lab test that is abnormal or we need to change your treatment, we will call you to review the results.  Testing/Procedures: None   Follow-Up: At [facility], you and your health needs are our priority.  As part of our continuing mission to provide you with exceptional heart care, we have created designated Provider Care Teams.  These Care Teams include your primary Cardiologist (physician) and Advanced Practice Providers (APPs -  Physician Assistants and  Nurse Practitioners) who all work together to provide you with the care you need, when you need it.  Your next appointment:   4 week(s)  The format for your next appointment:   In Person  Provider:   Randall An, PA-C  Other Instructions Thank you for choosing [facility]!       Signed, Ellsworth Lennox, PA-C  11/29/2019 7:47 PM     [facility] [address] [place], [place] [address] Phone: [phone] Fax: [phone]

## 2019-12-14 ENCOUNTER — Other Ambulatory Visit (INDEPENDENT_AMBULATORY_CARE_PROVIDER_SITE_OTHER): Payer: Self-pay | Admitting: Primary Care

## 2019-12-23 ENCOUNTER — Other Ambulatory Visit (INDEPENDENT_AMBULATORY_CARE_PROVIDER_SITE_OTHER): Payer: Self-pay | Admitting: Primary Care

## 2019-12-24 ENCOUNTER — Encounter (INDEPENDENT_AMBULATORY_CARE_PROVIDER_SITE_OTHER): Payer: Self-pay | Admitting: Primary Care

## 2019-12-24 NOTE — Telephone Encounter (Signed)
Sent to PCP ?

## 2019-12-25 ENCOUNTER — Institutional Professional Consult (permissible substitution): Payer: Medicaid Other | Admitting: Pulmonary Disease

## 2019-12-26 ENCOUNTER — Other Ambulatory Visit: Payer: Self-pay

## 2019-12-26 ENCOUNTER — Encounter: Payer: Self-pay | Admitting: Pulmonary Disease

## 2019-12-26 ENCOUNTER — Ambulatory Visit (INDEPENDENT_AMBULATORY_CARE_PROVIDER_SITE_OTHER): Payer: Medicaid Other | Admitting: Pulmonary Disease

## 2019-12-26 ENCOUNTER — Telehealth: Payer: Self-pay | Admitting: Pulmonary Disease

## 2019-12-26 ENCOUNTER — Other Ambulatory Visit: Payer: Self-pay | Admitting: General Surgery

## 2019-12-26 DIAGNOSIS — R942 Abnormal results of pulmonary function studies: Secondary | ICD-10-CM

## 2019-12-26 DIAGNOSIS — G4733 Obstructive sleep apnea (adult) (pediatric): Secondary | ICD-10-CM

## 2019-12-26 MED ORDER — SPIRIVA RESPIMAT 2.5 MCG/ACT IN AERS: 2.0000 | INHALATION_SPRAY | Freq: Every day | RESPIRATORY_TRACT | 0 refills | Status: DC

## 2019-12-26 MED ORDER — SPIRIVA RESPIMAT 2.5 MCG/ACT IN AERS: 2.0000 | INHALATION_SPRAY | Freq: Every day | RESPIRATORY_TRACT | 5 refills | Status: DC

## 2019-12-26 NOTE — Patient Instructions (Signed)
  Sent prescription for auto CPAP 12 to 18 cm to [facility] with a AirFit F30 full facemask  Sample of Spiriva and prescription x 5 refills You have to quit smoking!

## 2019-12-26 NOTE — Assessment & Plan Note (Signed)
Severe untreated OSA and is at high cardiovascular risk  The pathophysiology of obstructive sleep apnea , it's cardiovascular consequences & modes of treatment including CPAP were discused with the patient in detail & they evidenced understanding. We will place an order for auto CPAP 12 to 18 cm, in fact his weight is a few pounds lower than his prior during the study so 15 cm should be adequate.  We will provide him with an AirFit F30 full facemask which will leave a smaller implant on his face and hopefully he will be comfortable with this  Weight loss encouraged, compliance with goal of at least 4-6 hrs every night is the expectation. Advised against medications with sedative side effects Cautioned against driving when sleepy - understanding that sleepiness will vary on a day to day basis

## 2019-12-26 NOTE — Assessment & Plan Note (Signed)
Mostly restriction but decreased DLCO suggests emphysema although not seen on his chest x-ray. Smoking cessation is the most important intervention here and I emphasized that. He had subjective improvement with Spiriva and request this so we will provide him a prescription. Continue albuterol use as needed

## 2019-12-26 NOTE — Telephone Encounter (Signed)
I checked and can see his sleep study from 2018 done at [facility]  I called [facility] and advised that he already had this done  She states that with Medicaid is has to be done within a year  Please advise if okay to order study and if you want a home study or in lab thanks

## 2019-12-26 NOTE — Addendum Note (Signed)
Addended by: Ander Slade on: 12/26/2019 12:08 PM   Modules accepted: Orders

## 2019-12-26 NOTE — Progress Notes (Signed)
Subjective:    Patient ID: Chad Gillespie, male    DOB: 1966-02-24, 54 y.o.   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 He presented with increased somnolence and fatigue and loud snoring in 2018, underwent attended sleep study which showed severe OSA, was placed on CPAP of 15 cm with full facemask with good improvement in his daytime somnolence and fatigue. However he lost his insurance in 2019 and could not afford the machine and had to return it.  He has CAD status post 2 stents in 01/2018 which caused him to get disabled. He smokes about half pack per day, starting as a teenager, about 25 pack years, quit drinking 2 years ago. His fiance is reported loud snoring and witnessed apneas, she works nights and when she is home, he often sleeps in a recliner in the living room. Epworth sleepiness score is 14 Bedtime could be around 9 PM, TV stays on through the night, he sleeps on his side with 2 pillows, reports 4-5 nocturnal awakenings including nocturia and is out of bed by 5 AM feeling tired without headaches but with dryness of mouth.  Snoring and gasping episodes of open him up during sleep. He has gained 30 pounds in the last 2 years There is no history suggestive of cataplexy, sleep paralysis or parasomnias   Significant tests/ events reviewed  NPSG 05/2017 >> weight 323 pounds-AHI 95/hour, corrected by CPAP of 15 cm full facemask.  05/2017 PFTs moderate restriction, ratio 74, FEV1 69%, FVC 72%, TLC 74% with DLCO 53% corrects to 76 for alveolar volume   Past Medical History:  Diagnosis Date  . CAD (coronary artery disease)    a. s/p NSTEMI in 01/2017 with DESx2 to RCA  . MI (myocardial infarction) (HCC)   . Tobacco abuse     Past Surgical History:  Procedure Laterality Date  . CORONARY STENT INTERVENTION N/A 02/04/2017   Procedure: Coronary Stent Intervention;  Surgeon: Peter M [place], MD;  Location:  [facility] INVASIVE CV LAB;  Service: Cardiovascular;  Laterality: N/A;  . ESOPHAGOGASTRODUODENOSCOPY (EGD) WITH ESOPHAGEAL DILATION    . LEFT HEART CATH AND CORONARY ANGIOGRAPHY N/A 02/04/2017   Procedure: Left Heart Cath and Coronary Angiography;  Surgeon: Peter M [place], MD;  Location: [facility] INVASIVE CV LAB;  Service: Cardiovascular;  Laterality: N/A;    Allergies  Allergen Reactions  . Penicillins Hives    Has patient had a PCN reaction causing immediate rash, facial/tongue/throat swelling, SOB or lightheadedness with hypotension: Yes Has patient had a PCN reaction causing severe rash involving mucus membranes or skin necrosis: No Has patient had a PCN reaction that required hospitalization No Has patient had a PCN reaction occurring within the last 10 years: No If all of the above answers are "NO", then may proceed with Cephalosporin use.     Social History   Socioeconomic History  . Marital status: Single    Spouse name: Not on file  . Number of children: Not on file  . Years of education: Not on file  . Highest education level: Not on file  Occupational History  . Not on file  Tobacco Use  . Smoking status: Current Every Day Smoker    Packs/day: 0.10    Types: Cigarettes    Start date: 04/11/1986  . Smokeless tobacco: Never Used  Substance and Sexual Activity  . Alcohol use: Yes    Comment: occasionally   . Drug use:  No  . Sexual activity: Not on file  Other Topics Concern  . Not on file  Social History Narrative  . Not on file   Social Determinants of Health   Financial Resource Strain:   . Difficulty of Paying Living Expenses: Not on file  Food Insecurity:   . Worried About Charity fundraiser in the Last Year: Not on file  . Ran Out of Food in the Last Year: Not on file  Transportation Needs:   . Lack of Transportation (Medical): Not on file  . Lack of Transportation (Non-Medical): Not on file  Physical Activity:   . Days of Exercise per Week: Not on file  .  Minutes of Exercise per Session: Not on file  Stress:   . Feeling of Stress : Not on file  Social Connections:   . Frequency of Communication with Friends and Family: Not on file  . Frequency of Social Gatherings with Friends and Family: Not on file  . Attends Religious Services: Not on file  . Active Member of Clubs or Organizations: Not on file  . Attends Archivist Meetings: Not on file  . Marital Status: Not on file  Intimate Partner Violence:   . Fear of Current or Ex-Partner: Not on file  . Emotionally Abused: Not on file  . Physically Abused: Not on file  . Sexually Abused: Not on file      Family History  Problem Relation Age of Onset  . Hypertension Mother   . Cancer Father   . Heart attack Father   . Diabetes Father   . Diabetes Sister   . Cancer Sister      Review of Systems Constitutional: negative for anorexia, fevers and sweats  Eyes: negative for irritation, redness and visual disturbance  Ears, nose, mouth, throat, and face: negative for earaches, epistaxis, nasal congestion and sore throat  Respiratory: negative for cough, sputum and wheezing  Cardiovascular: negative for chest pain,  lower extremity edema, orthopnea, palpitations and syncope  Gastrointestinal: negative for abdominal pain, constipation, diarrhea, melena, nausea and vomiting  Genitourinary:negative for dysuria, frequency and hematuria  Hematologic/lymphatic: negative for bleeding, easy bruising and lymphadenopathy  Musculoskeletal:negative for arthralgias, muscle weakness and stiff joints  Neurological: negative for coordination problems, gait problems, headaches and weakness  Endocrine: negative for diabetic symptoms including polydipsia, polyuria and weight loss     Objective:   Physical Exam  Gen. Pleasant, obese, in no distress, normal affect ENT - no pallor,icterus, no post nasal drip, class 2-3 airway Neck: No JVD, no thyromegaly, no carotid bruits Lungs: no use of  accessory muscles, no dullness to percussion, decreased without rales or rhonchi  Cardiovascular: Rhythm regular, heart sounds  normal, no murmurs or gallops, no peripheral edema Abdomen: soft and non-tender, no hepatosplenomegaly, BS normal. Musculoskeletal: No deformities, no cyanosis or clubbing Neuro:  alert, non focal, no tremors       Assessment & Plan:    Assessment:   COPD -worsening due to ongoing smoking Severe OSA-at high cardiovascular risk  Plan Following Extensive Data Review & Interpretation:  . I reviewed prior external note(s) from cardiology . I reviewed the result(s) of PFTs and N PSG . I have ordered auto CPAP and repeat sleep study if required  Independent interpretation of tests . Review of patient's chest x-ray 01/2017 images revealed clear lungs. The patient's images have been independently reviewed by me.    Discussion of management or test interpretation with another colleague - sleep study.

## 2019-12-26 NOTE — Telephone Encounter (Signed)
He has a sleep study already-please send lab letter and ask if we need another sleep study?

## 2019-12-26 NOTE — Telephone Encounter (Signed)
Called and spoke with Patient.  Patient stated he was ordered a new cpap from [place], by Dr. Vassie Loll.  Patient stated [facility] stated Patient needed a new sleep study, because he has Medicaid.  Message routed to Dr. Vassie Loll

## 2019-12-27 ENCOUNTER — Other Ambulatory Visit: Payer: Self-pay | Admitting: General Surgery

## 2019-12-27 ENCOUNTER — Ambulatory Visit (INDEPENDENT_AMBULATORY_CARE_PROVIDER_SITE_OTHER): Payer: Medicaid Other | Admitting: Student

## 2019-12-27 ENCOUNTER — Encounter: Payer: Self-pay | Admitting: Student

## 2019-12-27 VITALS — BP 124/80 | HR 84 | Temp 97.1°F | Ht 72.0 in | Wt 316.0 lb

## 2019-12-27 DIAGNOSIS — J449 Chronic obstructive pulmonary disease, unspecified: Secondary | ICD-10-CM

## 2019-12-27 DIAGNOSIS — I25119 Atherosclerotic heart disease of native coronary artery with unspecified angina pectoris: Secondary | ICD-10-CM

## 2019-12-27 DIAGNOSIS — E785 Hyperlipidemia, unspecified: Secondary | ICD-10-CM

## 2019-12-27 DIAGNOSIS — R942 Abnormal results of pulmonary function studies: Secondary | ICD-10-CM

## 2019-12-27 DIAGNOSIS — R079 Chest pain, unspecified: Secondary | ICD-10-CM | POA: Diagnosis not present

## 2019-12-27 DIAGNOSIS — G4733 Obstructive sleep apnea (adult) (pediatric): Secondary | ICD-10-CM

## 2019-12-27 NOTE — Progress Notes (Addendum)
Cardiology Office Note    Date:  12/27/2019   ID:  Chad Gillespie, DOB 1965-11-05, MRN [id number]  PCP:  [name], NP  Cardiologist: [name], MD    Chief Complaint  Patient presents with  . Follow-up    1 month visit    History of Present Illness:    Chad Gillespie is a 54 y.o. male with past medical history of CAD (s/p NSTEMI in 01/2017 with DESx2 to RCA), HLD, COPD, obesity and tobacco use who presents to the office today for 29-month follow-up.   He was last examined by myself in 11/2019 and reported having stopped all of his medications in 2018. Had baseline dyspnea on exertion but this had been stable. Did report occasional episodes of chest pain which would occur at rest and could last for hours at a time and felt different from his prior angina. He was restarted on ASA and Atorvastatin 80mg  daily with consideration of stress testing in the future if symptoms persisted. He was referred back to Pulmonology given his COPD and OSA as he had been without his CPAP for over 2 years.   In talking the patient today, he does report he established with Pulmonology and is planning to undergo a repeat sleep study to be set up for CPAP. He still has baseline dyspnea on exertion but denies any orthopnea, PND or lower extremity edema. He does experience intermittent swelling along his entire right leg which has occurred for several years per his report and has been evaluated with no acute abnormalities identified.  He reports having frequent episodes of chest pain which typically occur at rest but has been relieved with the use of sublingual nitroglycerin. He has utilized over 15 nitroglycerin tablets within the past several weeks. He describes this as a shooting sensation along his sternum which radiates into his left pectoral region. No associated dyspnea, nausea or vomiting when this occurs. Episodes are not related to exertion but he reports not being overly active at baseline.  Not associated with food consumption or positional changes.   Past Medical History:  Diagnosis Date  . CAD (coronary artery disease)    a. s/p NSTEMI in 01/2017 with DESx2 to RCA  . MI (myocardial infarction) ([place])   . Tobacco abuse     Past Surgical History:  Procedure Laterality Date  . CORONARY STENT INTERVENTION N/A 02/04/2017   Procedure: Coronary Stent Intervention;  Surgeon: [name], MD;  Location: [facility];  Service: Cardiovascular;  Laterality: N/A;  . ESOPHAGOGASTRODUODENOSCOPY (EGD) WITH ESOPHAGEAL DILATION    . LEFT HEART CATH AND CORONARY ANGIOGRAPHY N/A 02/04/2017   Procedure: Left Heart Cath and Coronary Angiography;  Surgeon: [name], MD;  Location: [facility];  Service: Cardiovascular;  Laterality: N/A;    Current Medications: Outpatient Medications Prior to Visit  Medication Sig Dispense Refill  . albuterol (VENTOLIN HFA) 108 (90 Base) MCG/ACT inhaler INHALE 2 PUFFS INTO THE LUNGS EVERY 6 HOURS AS NEEDED FOR WHEEZING OR SHORTNESS OF BREATH 6.7 g 1  . aspirin 81 MG chewable tablet Chew 1 tablet (81 mg total) by mouth daily. 30 tablet 11  . atorvastatin (LIPITOR) 80 MG tablet Take 1 tablet (80 mg total) by mouth daily at 6 PM. 30 tablet 11  . gabapentin (NEURONTIN) 100 MG capsule TAKE 1 CAPSULE(100 MG) BY MOUTH THREE TIMES DAILY 90 capsule 1  . nicotine (NICODERM CQ) 14 mg/24hr patch Place 1 patch (14 mg total)  onto the skin daily. 28 patch 0  . nicotine (NICODERM CQ) 21 mg/24hr patch Place 1 patch (21 mg total) onto the skin daily. 28 patch 0  . nicotine (NICODERM CQ) 7 mg/24hr patch Place 1 patch (7 mg total) onto the skin daily. 28 patch 0  . nitroGLYCERIN (NITROSTAT) 0.4 MG SL tablet Place 1 tablet (0.4 mg total) under the tongue every 5 (five) minutes as needed for chest pain. 50 tablet 3  . Tiotropium Bromide Monohydrate (SPIRIVA RESPIMAT) 2.5 MCG/ACT AERS Inhale 2 puffs into the lungs daily. 8 g 5   No facility-administered  medications prior to visit.     Allergies:   Penicillins   Social History   Socioeconomic History  . Marital status: Single    Spouse name: Not on file  . Number of children: Not on file  . Years of education: Not on file  . Highest education level: Not on file  Occupational History  . Not on file  Tobacco Use  . Smoking status: Current Every Day Smoker    Packs/day: 0.10    Types: Cigarettes    Start date: 04/11/1986  . Smokeless tobacco: Never Used  Substance and Sexual Activity  . Alcohol use: Yes    Comment: occasionally   . Drug use: No  . Sexual activity: Not on file  Other Topics Concern  . Not on file  Social History Narrative  . Not on file   Social Determinants of Health   Financial Resource Strain:   . Difficulty of Paying Living Expenses:   Food Insecurity:   . Worried About Programme researcher, broadcasting/film/video in the Last Year:   . Barista in the Last Year:   Transportation Needs:   . Freight forwarder (Medical):   Marland Kitchen Lack of Transportation (Non-Medical):   Physical Activity:   . Days of Exercise per Week:   . Minutes of Exercise per Session:   Stress:   . Feeling of Stress :   Social Connections:   . Frequency of Communication with Friends and Family:   . Frequency of Social Gatherings with Friends and Family:   . Attends Religious Services:   . Active Member of Clubs or Organizations:   . Attends Banker Meetings:   Marland Kitchen Marital Status:      Family History:  The patient's family history includes Cancer in his father and sister; Diabetes in his father and sister; Heart attack in his father; Hypertension in his mother.   Review of Systems:   Please see the history of present illness.     General:  No chills, fever, night sweats or weight changes.  Cardiovascular:  No edema, orthopnea, palpitations, paroxysmal nocturnal dyspnea. Positive for chest pain and dyspnea on exertion.  Dermatological: No rash, lesions/masses Respiratory: No  cough, dyspnea Urologic: No hematuria, dysuria Abdominal:   No nausea, vomiting, diarrhea, bright red blood per rectum, melena, or hematemesis Neurologic:  No visual changes, wkns, changes in mental status. All other systems reviewed and are otherwise negative except as noted above.   Physical Exam:    VS:  BP 124/80   Pulse 84   Temp (!) 97.1 F (36.2 C)   Ht 6' (1.829 m)   Wt (!) 316 lb (143.3 kg)   SpO2 97%   BMI 42.86 kg/m    General: Well developed, well nourished,male appearing in no acute distress. Head: Normocephalic, atraumatic, sclera non-icteric.  Neck: No carotid bruits. JVD not elevated.  Lungs:  Respirations regular and unlabored, without wheezes or rales.  Heart: Regular rate and rhythm. No S3 or S4.  No murmur, no rubs, or gallops appreciated. Abdomen: Soft, non-tender, non-distended. No obvious abdominal masses. Msk:  Strength and tone appear normal for age. No obvious joint deformities or effusions. Extremities: No clubbing or cyanosis. No lower extremity edema.  Distal pedal pulses are 2+ bilaterally. Neuro: Alert and oriented X 3. Moves all extremities spontaneously. No focal deficits noted. Psych:  Responds to questions appropriately with a normal affect. Skin: No rashes or lesions noted  Wt Readings from Last 3 Encounters:  12/27/19 (!) 316 lb (143.3 kg)  12/26/19 (!) 314 lb 6.4 oz (142.6 kg)  11/29/19 (!) 318 lb (144.2 kg)     Studies/Labs Reviewed:   EKG:  EKG is not ordered today.   Recent Labs: 10/23/2019: ALT 23; BUN 11; Creatinine, Ser 0.92; Hemoglobin 16.8; Platelets 293; Potassium 4.2; Sodium 139   Lipid Panel    Component Value Date/Time   CHOL 202 (H) 10/23/2019 1030   TRIG 100 10/23/2019 1030   HDL 30 (L) 10/23/2019 1030   CHOLHDL 6.7 (H) 10/23/2019 1030   CHOLHDL 5.3 02/04/2017 0933   VLDL 17 02/04/2017 0933   LDLCALC 154 (H) 10/23/2019 1030    Additional studies/ records that were reviewed today include:   Cardiac  Catheterization: 01/2017  1st Mrg lesion, 50 %stenosed.  Prox Cx to Mid Cx lesion, 50 %stenosed.  There is mild left ventricular systolic dysfunction.  The left ventricular ejection fraction is 45-50% by visual estimate.  LV end diastolic pressure is moderately elevated.  Mid RCA to Dist RCA lesion, 100 %stenosed.  A STENT PROMUS PREM MR 3.0X20 drug eluting stent was successfully placed.  Post intervention, there is a 0% residual stenosis.  Mid RCA lesion, 70 %stenosed.  A STENT PROMUS PREM MR 3.5X16 drug eluting stent was successfully placed, and does not overlap previously placed stent.  Post intervention, there is a 0% residual stenosis.   1. 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 Mild LV dysfunction 3. Elevated LVEDP 4. Successful PCI of the mid and distal RCA with DES x 2.  Plan: DAPT for one year. Aggressive risk factor modification. If stable anticipate DC in am from a cardiac standpoint.  Echocardiogram: 01/2017 Study Conclusions   - Left ventricle: The cavity size was normal. Wall thickness was  normal. Systolic function was normal. The estimated ejection  fraction was in the range of 55% to 60%. Wall motion was normal;  there were no regional wall motion abnormalities. Doppler  parameters are consistent with abnormal left ventricular  relaxation (grade 1 diastolic dysfunction).   Assessment:    1. Coronary artery disease involving native coronary artery of native heart with angina pectoris (HCC)   2. Chest pain, unspecified type   3. Hyperlipidemia LDL goal <70   4. Chronic obstructive pulmonary disease, unspecified COPD type (HCC)   5. OSA (obstructive sleep apnea)      Plan:   In order of problems listed above:  1. CAD/Chest Pain - he is s/p NSTEMI in 01/2017 with DESx2 to the RCA. He describes episodes of chest pain which typically occur at rest but are relieved with SL NTG  and he has taken over 15 tablets within the past month. - given his cardiac history and continued tobacco use (previously smoking 4 ppd and down to 10 cigarettes daily), will plan for  a Lexiscan Myoview for ischemic evaluation. I did ask him to bring his Albuterol inhaler with him. If no large ischemic territories, would consider initiation of Imdur.  - continue ASA and statin therapy.   2. HLD - we did restart Atorvastatin 80mg  daily at the time of his last visit. Will plan for repeat FLP and LFT's in 2 months.   3. COPD - he has re-established with Pulmonology and was recently started on Spiriva.   4. OSA - he has been evaluated by Pulmonology with plans for an upcoming sleep study.    ADDENDUM: 01/15/2020  Called the patient to discuss abnormal stress test results which showed mild to moderate peri-infarct ischemia. Had previously reviewed with Dr. Antoine Poche who recommended a cardiac catheterization for definitive evaluation. This was reviewed with the patient today and he is in agreement to proceed.   The patient understands that risks include but are not limited to stroke (1 in 1000), death (1 in 1000), kidney failure [usually temporary] (1 in 500), bleeding (1 in 200), allergic reaction [possibly serious] (1 in 200).   Scheduled for 01/18/2020 at 10:30 with Dr. Okey Dupre. Arrive at 8:30 AM. Will plan for pre-procedure labs and COVID-testing tomorrow. He did have recurrent chest pain last night which resolved with SL NTG. We reviewed if has recurrent chest pain in the interim that does not resolve with SL NTG x3, he needs to proceed to the Emergency Department for immediate evaluation.    Medication Adjustments/Labs and Tests Ordered: Current medicines are reviewed at length with the patient today.  Concerns regarding medicines are outlined above.  Medication changes, Labs and Tests ordered today are listed in the Patient Instructions below. Patient Instructions  Medication Instructions:    Your physician recommends that you continue on your current medications as directed. Please refer to the Current Medication list given to you today.  *If you need a refill on your cardiac medications before your next appointment, please call your pharmacy*   Lab Work: None today If you have labs (blood work) drawn today and your tests are completely normal, you will receive your results only by: Marland Kitchen MyChart Message (if you have MyChart) OR . A paper copy in the mail If you have any lab test that is abnormal or we need to change your treatment, we will call you to review the results.   Testing/Procedures: Your physician has requested that you have a lexiscan myoview. For further information please visit [email or web address] Please follow instruction sheet, as given.     Follow-Up: At [facility], you and your health needs are our priority.  As part of our continuing mission to provide you with exceptional heart care, we have created designated Provider Care Teams.  These Care Teams include your primary Cardiologist (physician) and Advanced Practice Providers (APPs -  Physician Assistants and Nurse Practitioners) who all work together to provide you with the care you need, when you need it.  We recommend signing up for the patient portal called "MyChart".  Sign up information is provided on this After Visit Summary.  MyChart is used to connect with patients for Virtual Visits (Telemedicine).  Patients are able to view lab/test results, encounter notes, upcoming appointments, etc.  Non-urgent messages can be sent to your provider as well.   To learn more about what you can do with MyChart, go to [email or web address].    Your next appointment:   3 month(s)  The format for your next appointment:   In Person  Provider:  Randall An, PA-C or Dr.Hochrein   Other Instructions BRING  Albuterol inhaler to [facility] test      Thank you for choosing [facility] !            Signed, Ellsworth Lennox, PA-C  12/27/2019 6:53 PM    [facility] [address] [place], [place] [address] Phone: [phone] Fax: [phone]

## 2019-12-27 NOTE — Telephone Encounter (Signed)
Home study, if Medicare does not allow then split-night study

## 2019-12-27 NOTE — H&P (View-Only) (Signed)
Cardiology Office Note    Date:  12/27/2019   ID:  Chad Gillespie, DOB 1965-11-05, MRN [id number]  PCP:  Chad Perna, NP  Cardiologist: Chad Breeding, MD    Chief Complaint  Patient presents with  . Follow-up    1 month visit    History of Present Illness:    Chad Gillespie is a 54 y.o. male with past medical history of CAD (s/p NSTEMI in 01/2017 with DESx2 to RCA), HLD, COPD, obesity and tobacco use who presents to the office today for 29-month follow-up.   He was last examined by myself in 11/2019 and reported having stopped all of his medications in 2018. Had baseline dyspnea on exertion but this had been stable. Did report occasional episodes of chest pain which would occur at rest and could last for hours at a time and felt different from his prior angina. He was restarted on ASA and Atorvastatin 80mg  daily with consideration of stress testing in the future if symptoms persisted. He was referred back to Pulmonology given his COPD and OSA as he had been without his CPAP for over 2 years.   In talking the patient today, he does report he established with Pulmonology and is planning to undergo a repeat sleep study to be set up for CPAP. He still has baseline dyspnea on exertion but denies any orthopnea, PND or lower extremity edema. He does experience intermittent swelling along his entire right leg which has occurred for several years per his report and has been evaluated with no acute abnormalities identified.  He reports having frequent episodes of chest pain which typically occur at rest but has been relieved with the use of sublingual nitroglycerin. He has utilized over 15 nitroglycerin tablets within the past several weeks. He describes this as a shooting sensation along his sternum which radiates into his left pectoral region. No associated dyspnea, nausea or vomiting when this occurs. Episodes are not related to exertion but he reports not being overly active at baseline.  Not associated with food consumption or positional changes.   Past Medical History:  Diagnosis Date  . CAD (coronary artery disease)    a. s/p NSTEMI in 01/2017 with DESx2 to RCA  . MI (myocardial infarction) ([place])   . Tobacco abuse     Past Surgical History:  Procedure Laterality Date  . CORONARY STENT INTERVENTION N/A 02/04/2017   Procedure: Coronary Stent Intervention;  Surgeon: Chad M Martinique, MD;  Location: [facility];  Service: Cardiovascular;  Laterality: N/A;  . ESOPHAGOGASTRODUODENOSCOPY (EGD) WITH ESOPHAGEAL DILATION    . LEFT HEART CATH AND CORONARY ANGIOGRAPHY N/A 02/04/2017   Procedure: Left Heart Cath and Coronary Angiography;  Surgeon: Chad M Martinique, MD;  Location: [facility];  Service: Cardiovascular;  Laterality: N/A;    Current Medications: Outpatient Medications Prior to Visit  Medication Sig Dispense Refill  . albuterol (VENTOLIN HFA) 108 (90 Base) MCG/ACT inhaler INHALE 2 PUFFS INTO THE LUNGS EVERY 6 HOURS AS NEEDED FOR WHEEZING OR SHORTNESS OF BREATH 6.7 g 1  . aspirin 81 MG chewable tablet Chew 1 tablet (81 mg total) by mouth daily. 30 tablet 11  . atorvastatin (LIPITOR) 80 MG tablet Take 1 tablet (80 mg total) by mouth daily at 6 PM. 30 tablet 11  . gabapentin (NEURONTIN) 100 MG capsule TAKE 1 CAPSULE(100 MG) BY MOUTH THREE TIMES DAILY 90 capsule 1  . nicotine (NICODERM CQ) 14 mg/24hr patch Place 1 patch (14 mg total)  onto the skin daily. 28 patch 0  . nicotine (NICODERM CQ) 21 mg/24hr patch Place 1 patch (21 mg total) onto the skin daily. 28 patch 0  . nicotine (NICODERM CQ) 7 mg/24hr patch Place 1 patch (7 mg total) onto the skin daily. 28 patch 0  . nitroGLYCERIN (NITROSTAT) 0.4 MG SL tablet Place 1 tablet (0.4 mg total) under the tongue every 5 (five) minutes as needed for chest pain. 50 tablet 3  . Tiotropium Bromide Monohydrate (SPIRIVA RESPIMAT) 2.5 MCG/ACT AERS Inhale 2 puffs into the lungs daily. 8 g 5   No facility-administered  medications prior to visit.     Allergies:   Penicillins   Social History   Socioeconomic History  . Marital status: Single    Spouse name: Not on file  . Number of children: Not on file  . Years of education: Not on file  . Highest education level: Not on file  Occupational History  . Not on file  Tobacco Use  . Smoking status: Current Every Day Smoker    Packs/day: 0.10    Types: Cigarettes    Start date: 04/11/1986  . Smokeless tobacco: Never Used  Substance and Sexual Activity  . Alcohol use: Yes    Comment: occasionally   . Drug use: No  . Sexual activity: Not on file  Other Topics Concern  . Not on file  Social History Narrative  . Not on file   Social Determinants of Health   Financial Resource Strain:   . Difficulty of Paying Living Expenses:   Food Insecurity:   . Worried About Programme researcher, broadcasting/film/video in the Last Year:   . Barista in the Last Year:   Transportation Needs:   . Freight forwarder (Medical):   Marland Kitchen Lack of Transportation (Non-Medical):   Physical Activity:   . Days of Exercise per Week:   . Minutes of Exercise per Session:   Stress:   . Feeling of Stress :   Social Connections:   . Frequency of Communication with Friends and Family:   . Frequency of Social Gatherings with Friends and Family:   . Attends Religious Services:   . Active Member of Clubs or Organizations:   . Attends Banker Meetings:   Marland Kitchen Marital Status:      Family History:  The patient's family history includes Cancer in his father and sister; Diabetes in his father and sister; Heart attack in his father; Hypertension in his mother.   Review of Systems:   Please see the history of present illness.     General:  No chills, fever, night sweats or weight changes.  Cardiovascular:  No edema, orthopnea, palpitations, paroxysmal nocturnal dyspnea. Positive for chest pain and dyspnea on exertion.  Dermatological: No rash, lesions/masses Respiratory: No  cough, dyspnea Urologic: No hematuria, dysuria Abdominal:   No nausea, vomiting, diarrhea, bright red blood per rectum, melena, or hematemesis Neurologic:  No visual changes, wkns, changes in mental status. All other systems reviewed and are otherwise negative except as noted above.   Physical Exam:    VS:  BP 124/80   Pulse 84   Temp (!) 97.1 F (36.2 C)   Ht 6' (1.829 m)   Wt (!) 316 lb (143.3 kg)   SpO2 97%   BMI 42.86 kg/m    General: Well developed, well nourished,male appearing in no acute distress. Head: Normocephalic, atraumatic, sclera non-icteric.  Neck: No carotid bruits. JVD not elevated.  Lungs:  Respirations regular and unlabored, without wheezes or rales.  Heart: Regular rate and rhythm. No S3 or S4.  No murmur, no rubs, or gallops appreciated. Abdomen: Soft, non-tender, non-distended. No obvious abdominal masses. Msk:  Strength and tone appear normal for age. No obvious joint deformities or effusions. Extremities: No clubbing or cyanosis. No lower extremity edema.  Distal pedal pulses are 2+ bilaterally. Neuro: Alert and oriented X 3. Moves all extremities spontaneously. No focal deficits noted. Psych:  Responds to questions appropriately with a normal affect. Skin: No rashes or lesions noted  Wt Readings from Last 3 Encounters:  12/27/19 (!) 316 lb (143.3 kg)  12/26/19 (!) 314 lb 6.4 oz (142.6 kg)  11/29/19 (!) 318 lb (144.2 kg)     Studies/Labs Reviewed:   EKG:  EKG is not ordered today.   Recent Labs: 10/23/2019: ALT 23; BUN 11; Creatinine, Ser 0.92; Hemoglobin 16.8; Platelets 293; Potassium 4.2; Sodium 139   Lipid Panel    Component Value Date/Time   CHOL 202 (H) 10/23/2019 1030   TRIG 100 10/23/2019 1030   HDL 30 (L) 10/23/2019 1030   CHOLHDL 6.7 (H) 10/23/2019 1030   CHOLHDL 5.3 02/04/2017 0933   VLDL 17 02/04/2017 0933   LDLCALC 154 (H) 10/23/2019 1030    Additional studies/ records that were reviewed today include:   Cardiac  Catheterization: 01/2017  1st Mrg lesion, 50 %stenosed.  Prox Cx to Mid Cx lesion, 50 %stenosed.  There is mild left ventricular systolic dysfunction.  The left ventricular ejection fraction is 45-50% by visual estimate.  LV end diastolic pressure is moderately elevated.  Mid RCA to Dist RCA lesion, 100 %stenosed.  A STENT PROMUS PREM MR 3.0X20 drug eluting stent was successfully placed.  Post intervention, there is a 0% residual stenosis.  Mid RCA lesion, 70 %stenosed.  A STENT PROMUS PREM MR 3.5X16 drug eluting stent was successfully placed, and does not overlap previously placed stent.  Post intervention, there is a 0% residual stenosis.   1. 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 Mild LV dysfunction 3. Elevated LVEDP 4. Successful PCI of the mid and distal RCA with DES x 2.  Plan: DAPT for one year. Aggressive risk factor modification. If stable anticipate DC in am from a cardiac standpoint.  Echocardiogram: 01/2017 Study Conclusions   - Left ventricle: The cavity size was normal. Wall thickness was  normal. Systolic function was normal. The estimated ejection  fraction was in the range of 55% to 60%. Wall motion was normal;  there were no regional wall motion abnormalities. Doppler  parameters are consistent with abnormal left ventricular  relaxation (grade 1 diastolic dysfunction).   Assessment:    1. Coronary artery disease involving native coronary artery of native heart with angina pectoris (HCC)   2. Chest pain, unspecified type   3. Hyperlipidemia LDL goal <70   4. Chronic obstructive pulmonary disease, unspecified COPD type (HCC)   5. OSA (obstructive sleep apnea)      Plan:   In order of problems listed above:  1. CAD/Chest Pain - he is s/p NSTEMI in 01/2017 with DESx2 to the RCA. He describes episodes of chest pain which typically occur at rest but are relieved with SL NTG  and he has taken over 15 tablets within the past month. - given his cardiac history and continued tobacco use (previously smoking 4 ppd and down to 10 cigarettes daily), will plan for  a Lexiscan Myoview for ischemic evaluation. I did ask him to bring his Albuterol inhaler with him. If no large ischemic territories, would consider initiation of Imdur.  - continue ASA and statin therapy.   2. HLD - we did restart Atorvastatin 80mg daily at the time of his last visit. Will plan for repeat FLP and LFT's in 2 months.   3. COPD - he has re-established with Pulmonology and was recently started on Spiriva.   4. OSA - he has been evaluated by Pulmonology with plans for an upcoming sleep study.    ADDENDUM: 01/15/2020  Called the patient to discuss abnormal stress test results which showed mild to moderate peri-infarct ischemia. Had previously reviewed with Dr. Hochrein who recommended a cardiac catheterization for definitive evaluation. This was reviewed with the patient today and he is in agreement to proceed.   The patient understands that risks include but are not limited to stroke (1 in 1000), death (1 in 1000), kidney failure [usually temporary] (1 in 500), bleeding (1 in 200), allergic reaction [possibly serious] (1 in 200).   Scheduled for 01/18/2020 at 10:30 with Dr. End. Arrive at 8:30 AM. Will plan for pre-procedure labs and COVID-testing tomorrow. He did have recurrent chest pain last night which resolved with SL NTG. We reviewed if has recurrent chest pain in the interim that does not resolve with SL NTG x3, he needs to proceed to the Emergency Department for immediate evaluation.    Medication Adjustments/Labs and Tests Ordered: Current medicines are reviewed at length with the patient today.  Concerns regarding medicines are outlined above.  Medication changes, Labs and Tests ordered today are listed in the Patient Instructions below. Patient Instructions  Medication Instructions:    Your physician recommends that you continue on your current medications as directed. Please refer to the Current Medication list given to you today.  *If you need a refill on your cardiac medications before your next appointment, please call your pharmacy*   Lab Work: None today If you have labs (blood work) drawn today and your tests are completely normal, you will receive your results only by: . MyChart Message (if you have MyChart) OR . A paper copy in the mail If you have any lab test that is abnormal or we need to change your treatment, we will call you to review the results.   Testing/Procedures: Your physician has requested that you have a lexiscan myoview. For further information please visit [email or web address]. Please follow instruction sheet, as given.     Follow-Up: At [facility], you and your health needs are our priority.  As part of our continuing mission to provide you with exceptional heart care, we have created designated Provider Care Teams.  These Care Teams include your primary Cardiologist (physician) and Advanced Practice Providers (APPs -  Physician Assistants and Nurse Practitioners) who all work together to provide you with the care you need, when you need it.  We recommend signing up for the patient portal called "MyChart".  Sign up information is provided on this After Visit Summary.  MyChart is used to connect with patients for Virtual Visits (Telemedicine).  Patients are able to view lab/test results, encounter notes, upcoming appointments, etc.  Non-urgent messages can be sent to your provider as well.   To learn more about what you can do with MyChart, go to [email or web address].    Your next appointment:   3 month(s)  The format for your next appointment:   In Person  Provider:     Randall An, PA-C or Dr.Hochrein   Other Instructions BRING  Albuterol inhaler to [facility] test      Thank you for choosing [facility] !            Signed, Ellsworth Lennox, PA-C  12/27/2019 6:53 PM    [facility] [address] [place], [place] [address] Phone: [phone] Fax: [phone]

## 2019-12-27 NOTE — Telephone Encounter (Signed)
Called pt and notified that he would need another sleep study per Medicaid. DME had already informed pt. Let him know we were ordering HST but if Medicaid denies then we will order in-lab study. Pt agrees with plan HST ordered.

## 2019-12-27 NOTE — Patient Instructions (Signed)
Medication Instructions:  Your physician recommends that you continue on your current medications as directed. Please refer to the Current Medication list given to you today.  *If you need a refill on your cardiac medications before your next appointment, please call your pharmacy*   Lab Work: None today If you have labs (blood work) drawn today and your tests are completely normal, you will receive your results only by: Marland Kitchen MyChart Message (if you have MyChart) OR . A paper copy in the mail If you have any lab test that is abnormal or we need to change your treatment, we will call you to review the results.   Testing/Procedures: Your physician has requested that you have a lexiscan myoview. For further information please visit [email or web address] Please follow instruction sheet, as given.     Follow-Up: At [facility], you and your health needs are our priority.  As part of our continuing mission to provide you with exceptional heart care, we have created designated Provider Care Teams.  These Care Teams include your primary Cardiologist (physician) and Advanced Practice Providers (APPs -  Physician Assistants and Nurse Practitioners) who all work together to provide you with the care you need, when you need it.  We recommend signing up for the patient portal called "MyChart".  Sign up information is provided on this After Visit Summary.  MyChart is used to connect with patients for Virtual Visits (Telemedicine).  Patients are able to view lab/test results, encounter notes, upcoming appointments, etc.  Non-urgent messages can be sent to your provider as well.   To learn more about what you can do with MyChart, go to [email or web address].    Your next appointment:   3 month(s)  The format for your next appointment:   In Person  Provider:   Randall An, PA-C or Dr.Hochrein   Other Instructions BRING  Albuterol inhaler to [facility] test      Thank you for choosing  [facility] !

## 2019-12-28 ENCOUNTER — Other Ambulatory Visit (HOSPITAL_BASED_OUTPATIENT_CLINIC_OR_DEPARTMENT_OTHER): Payer: Self-pay

## 2020-01-08 ENCOUNTER — Other Ambulatory Visit: Payer: Self-pay

## 2020-01-08 ENCOUNTER — Other Ambulatory Visit (HOSPITAL_COMMUNITY)
Admission: RE | Admit: 2020-01-08 | Discharge: 2020-01-08 | Disposition: A | Discharge status: Home / Self Care | Payer: Medicaid Other | Source: Ambulatory Visit | Attending: Pulmonary Disease | Admitting: Pulmonary Disease

## 2020-01-08 DIAGNOSIS — Z20822 Contact with and (suspected) exposure to covid-19: Secondary | ICD-10-CM | POA: Insufficient documentation

## 2020-01-08 DIAGNOSIS — Z01812 Encounter for preprocedural laboratory examination: Secondary | ICD-10-CM | POA: Diagnosis not present

## 2020-01-08 LAB — SARS CORONAVIRUS 2 (TAT 6-24 HRS): SARS Coronavirus 2: NEGATIVE

## 2020-01-10 ENCOUNTER — Encounter (HOSPITAL_BASED_OUTPATIENT_CLINIC_OR_DEPARTMENT_OTHER)
Admission: RE | Admit: 2020-01-10 | Discharge: 2020-01-10 | Disposition: A | Discharge status: Home / Self Care | Payer: Medicaid Other | Source: Ambulatory Visit | Attending: Student | Admitting: Student

## 2020-01-10 ENCOUNTER — Other Ambulatory Visit: Payer: Self-pay

## 2020-01-10 ENCOUNTER — Encounter (HOSPITAL_COMMUNITY)
Admission: RE | Admit: 2020-01-10 | Discharge: 2020-01-10 | Disposition: A | Discharge status: Home / Self Care | Payer: Medicaid Other | Source: Ambulatory Visit | Attending: Student | Admitting: Student

## 2020-01-10 DIAGNOSIS — R079 Chest pain, unspecified: Secondary | ICD-10-CM | POA: Insufficient documentation

## 2020-01-10 LAB — NM MYOCAR MULTI W/SPECT W/WALL MOTION / EF
LV dias vol: 162 mL (ref 62–150)
LV sys vol: 95 mL
Peak HR: 86 {beats}/min
RATE: 0.52
Rest HR: 77 {beats}/min
SDS: 4
SRS: 1
SSS: 5
TID: 1.35

## 2020-01-10 MED ORDER — REGADENOSON 0.4 MG/5ML IV SOLN
INTRAVENOUS | Status: AC
  Administered 2020-01-10: 11:00:00 0.4 mg via INTRAVENOUS
  Filled 2020-01-10: qty 5

## 2020-01-10 MED ORDER — TECHNETIUM TC 99M TETROFOSMIN IV KIT
10.0000 | PACK | Freq: Once | INTRAVENOUS | Status: AC | PRN
  Administered 2020-01-10: 11 via INTRAVENOUS

## 2020-01-10 MED ORDER — SODIUM CHLORIDE FLUSH 0.9 % IV SOLN
INTRAVENOUS | Status: AC
  Administered 2020-01-10: 10 mL via INTRAVENOUS
  Filled 2020-01-10: qty 10

## 2020-01-10 MED ORDER — TECHNETIUM TC 99M TETROFOSMIN IV KIT
30.0000 | PACK | Freq: Once | INTRAVENOUS | Status: AC | PRN
  Administered 2020-01-10: 11:00:00 32.5 via INTRAVENOUS

## 2020-01-11 ENCOUNTER — Other Ambulatory Visit: Payer: Self-pay

## 2020-01-11 ENCOUNTER — Ambulatory Visit: Payer: Medicaid Other | Attending: Pulmonary Disease | Admitting: Pulmonary Disease

## 2020-01-11 DIAGNOSIS — R942 Abnormal results of pulmonary function studies: Secondary | ICD-10-CM | POA: Diagnosis not present

## 2020-01-11 DIAGNOSIS — I493 Ventricular premature depolarization: Secondary | ICD-10-CM | POA: Diagnosis not present

## 2020-01-11 DIAGNOSIS — G4733 Obstructive sleep apnea (adult) (pediatric): Secondary | ICD-10-CM | POA: Diagnosis not present

## 2020-01-14 ENCOUNTER — Telehealth: Payer: Self-pay

## 2020-01-14 NOTE — Telephone Encounter (Signed)
Spoke with pt. He states that he would rather a phone call be made to him to discuss in greater length and not an actual visit. He will be available at any time tomorrow. He does have a current RX for nitro. He also understands the "rule of 3"

## 2020-01-15 ENCOUNTER — Telehealth: Payer: Self-pay | Admitting: Pulmonary Disease

## 2020-01-15 ENCOUNTER — Telehealth: Payer: Self-pay

## 2020-01-15 DIAGNOSIS — G4733 Obstructive sleep apnea (adult) (pediatric): Secondary | ICD-10-CM | POA: Diagnosis not present

## 2020-01-15 DIAGNOSIS — E785 Hyperlipidemia, unspecified: Secondary | ICD-10-CM

## 2020-01-15 DIAGNOSIS — R079 Chest pain, unspecified: Secondary | ICD-10-CM

## 2020-01-15 DIAGNOSIS — I25119 Atherosclerotic heart disease of native coronary artery with unspecified angina pectoris: Secondary | ICD-10-CM

## 2020-01-15 MED ORDER — STIOLTO RESPIMAT 2.5-2.5 MCG/ACT IN AERS: 2.0000 | INHALATION_SPRAY | Freq: Every day | RESPIRATORY_TRACT | 6 refills | Status: DC

## 2020-01-15 NOTE — Telephone Encounter (Signed)
     Called the patient to discuss abnormal stress test results which showed mild to moderate peri-infarct ischemia. Had previously reviewed with Dr. Antoine Poche who recommended a cardiac catheterization for definitive evaluation. This was reviewed with the patient today and he is in agreement to proceed.   The patient understands that risks include but are not limited to stroke (1 in 1000), death (1 in 1000), kidney failure [usually temporary] (1 in 500), bleeding (1 in 200), allergic reaction [possibly serious] (1 in 200).    Scheduled for 01/18/2020 at 10:30 with Dr. Okey Dupre. Arrive at 8:30 AM. Will plan for pre-procedure labs and COVID-testing tomorrow. He did have recurrent chest pain last night which resolved with SL NTG. We reviewed if has recurrent chest pain in the interim that does not resolve with SL NTG x3, he needs to proceed to the Emergency Department for immediate evaluation.   Signed, Ellsworth Lennox, PA-C 01/15/2020, 4:46 PM Pager: [phone]

## 2020-01-15 NOTE — Telephone Encounter (Signed)
Labs ordered for cath;

## 2020-01-15 NOTE — Telephone Encounter (Signed)
Spoke with Chad Gillespie and notified of results per Dr. Vassie Loll. Chad Gillespie verbalized understanding and denied any questions. Order sent to [facility] for CPAP. Appt with Chad Gillespie scheduled for 1 month.

## 2020-01-15 NOTE — Telephone Encounter (Signed)
Based on ttiration study, pl send earlier order for auto CPAP 8-18 cm OV in 1 month with download

## 2020-01-15 NOTE — Telephone Encounter (Signed)
Called and spoke with patient to let him know that we were switching him to [facility] and that I had sent over prescription to his pharmacy. Patient expressed understanding. Nothing further needed at this time.

## 2020-01-15 NOTE — Telephone Encounter (Signed)
OK to change to stiolto

## 2020-01-15 NOTE — Telephone Encounter (Signed)
Pt's Spiriva Respimat is requiring a PA due to pt having Medicaid. Called [facility] and spoke with Eta to initiate the PA. Prior to the PA for Spiriva Respimat being approved, pt needs to try/fail two of the preferred inhalers. The preferred inhalers with pt's Medicaid insurance include Bevespi, Combivent Respimat, ipratropium solution, ipratropium-albuterol solution, Spiriva Handihaler, or Stiolto.  Dr. Vassie Loll, please advise on which inhaler to switch pt to.

## 2020-01-15 NOTE — Procedures (Signed)
Patient Name: Chad Gillespie, Chad Gillespie Date: 01/11/2020 Gender: Male D.O.B: 1966/03/02 Age (years): 61 Referring Provider: Kara Mead MD, ABSM Height (inches): 72 Interpreting Physician: Kara Mead MD, ABSM Weight (lbs): 323 RPSGT: Peak, Robert BMI: 44 MRN: [id number] Neck Size: 21.00 <br> <br>   CLINICAL INFORMATION The patient is referred for a split night study with BPAP.   MEDICATIONS Medications self-administered by patient taken the night of the study : N/A  SLEEP STUDY TECHNIQUE As per the AASM Manual for the Scoring of Sleep and Associated Events v2.3 (April 2016) with a hypopnea requiring 4% desaturations.  The channels recorded and monitored were frontal, central and occipital EEG, electrooculogram (EOG), submentalis EMG (chin), nasal and oral airflow, thoracic and abdominal wall motion, anterior tibialis EMG, snore microphone, electrocardiogram, and pulse oximetry. Bi-level positive airway pressure (BiPAP) was initiated when the patient met split night criteria and was titrated according to treat sleep-disordered breathing.  RESPIRATORY PARAMETERS Diagnostic  Total AHI (/hr): 59.0 RDI (/hr): 59.0 OA Index (/hr): 10.8 CA Index (/hr): 0.0 REM AHI (/hr): 92.3 NREM AHI (/hr): 57.4 Supine AHI (/hr): 42.5 Non-supine AHI (/hr): 84.33 Min O2 Sat (%): 72.0 Mean O2 (%): 87.6 Time below 88% (min): 98.5   Titration  Optimal IPAP Pressure (cm):  Optimal EPAP Pressure (cm):  AHI at Optimal Pressure (/hr): N/A Min O2 at Optimal Pressure (%): 81.0 Sleep % at Optimal (%): N/A Supine % at Optimal (%): N/A     SLEEP ARCHITECTURE The study was initiated at 9:01:15 PM and terminated at 5:00:50 AM. The total recorded time was 479.6 minutes. EEG confirmed total sleep time was 400 minutes yielding a sleep efficiency of 83.4%%. Sleep onset after lights out was 26.6 minutes with a REM latency of 140.0 minutes. The patient spent 6.0%% of the night in stage N1 sleep, 67.6%% in stage N2  sleep, 5.1%% in stage N3 and 21.3% in REM. Wake after sleep onset (WASO) was 53.0 minutes. The Arousal Index was 17.1/hour.  LEG MOVEMENT DATA The total Periodic Limb Movements of Sleep (PLMS) were 0. The PLMS index was 0.0 .  CARDIAC DATA The 2 lead EKG demonstrated sinus rhythm. The mean heart rate was 100.0 beats per minute. Other EKG findings include: PVCs.   IMPRESSIONS - Severe obstructive sleep apnea occurred during the diagnostic portion of the study (AHI = 59.0 /hour). He did not tolerate CPAP since he could not exhale against the continuous pressure.An optimal BPAP pressure could not be selected for this patient based on the available study data. On 15/8 he had a few residual hypopneas - No significant central sleep apnea occurred during the diagnostic portion of the study (CAI = 0.0/hour). - Moderate oxygen desaturation was noted during the diagnostic portion of the study (Min O2 = 72.0%). - The patient snored with loud snoring volume during the diagnostic portion of the study. - EKG findings include PVCs. - Clinically significant periodic limb movements of sleep did not occur during the study.   DIAGNOSIS - Obstructive Sleep Apnea (327.23 [G47.33 ICD-10])   RECOMMENDATIONS - Recommend autobipap EPAP minimum 8, IPAP max 20, PS +7cm  - Avoid alcohol, sedatives and other CNS depressants that may worsen sleep apnea and disrupt normal sleep architecture. - Sleep hygiene should be reviewed to assess factors that may improve sleep quality. - Weight management and regular exercise should be initiated or continued. - Return to Sleep Center for re-evaluation.    Kara Mead MD Board Certified in [place]

## 2020-01-16 ENCOUNTER — Other Ambulatory Visit (HOSPITAL_COMMUNITY)
Admission: RE | Admit: 2020-01-16 | Discharge: 2020-01-16 | Disposition: A | Discharge status: Home / Self Care | Payer: Medicaid Other | Source: Ambulatory Visit | Attending: Neurology | Admitting: Neurology

## 2020-01-16 ENCOUNTER — Other Ambulatory Visit: Payer: Self-pay

## 2020-01-16 ENCOUNTER — Other Ambulatory Visit (HOSPITAL_COMMUNITY)
Admission: RE | Admit: 2020-01-16 | Discharge: 2020-01-16 | Disposition: A | Discharge status: Home / Self Care | Payer: Medicaid Other | Source: Ambulatory Visit | Attending: Student | Admitting: Student

## 2020-01-16 ENCOUNTER — Telehealth: Payer: Self-pay | Admitting: Pulmonary Disease

## 2020-01-16 DIAGNOSIS — R079 Chest pain, unspecified: Secondary | ICD-10-CM | POA: Insufficient documentation

## 2020-01-16 DIAGNOSIS — I25119 Atherosclerotic heart disease of native coronary artery with unspecified angina pectoris: Secondary | ICD-10-CM | POA: Insufficient documentation

## 2020-01-16 DIAGNOSIS — Z20822 Contact with and (suspected) exposure to covid-19: Secondary | ICD-10-CM | POA: Insufficient documentation

## 2020-01-16 LAB — CBC WITH DIFFERENTIAL/PLATELET
Abs Immature Granulocytes: 0.04 10*3/uL (ref 0.00–0.07)
Basophils Absolute: 0.1 10*3/uL (ref 0.0–0.1)
Basophils Relative: 1 %
Eosinophils Absolute: 0.3 10*3/uL (ref 0.0–0.5)
Eosinophils Relative: 3 %
HCT: 49.2 % (ref 39.0–52.0)
Hemoglobin: 16.5 g/dL (ref 13.0–17.0)
Immature Granulocytes: 0 %
Lymphocytes Relative: 27 %
Lymphs Abs: 2.8 10*3/uL (ref 0.7–4.0)
MCH: 30 pg (ref 26.0–34.0)
MCHC: 33.5 g/dL (ref 30.0–36.0)
MCV: 89.5 fL (ref 80.0–100.0)
Monocytes Absolute: 0.8 10*3/uL (ref 0.1–1.0)
Monocytes Relative: 7 %
Neutro Abs: 6.3 10*3/uL (ref 1.7–7.7)
Neutrophils Relative %: 62 %
Platelets: 289 10*3/uL (ref 150–400)
RBC: 5.5 MIL/uL (ref 4.22–5.81)
RDW: 12.7 % (ref 11.5–15.5)
WBC: 10.3 10*3/uL (ref 4.0–10.5)
nRBC: 0 % (ref 0.0–0.2)

## 2020-01-16 LAB — BASIC METABOLIC PANEL
Anion gap: 9 (ref 5–15)
BUN: 8 mg/dL (ref 6–20)
CO2: 26 mmol/L (ref 22–32)
Calcium: 9.3 mg/dL (ref 8.9–10.3)
Chloride: 102 mmol/L (ref 98–111)
Creatinine, Ser: 0.91 mg/dL (ref 0.61–1.24)
GFR calc Af Amer: >60 mL/min (ref >60)
GFR calc non Af Amer: >60 mL/min (ref >60)
Glucose, Bld: 107 mg/dL — ABNORMAL HIGH (ref 70–99)
Potassium: 4.1 mmol/L (ref 3.5–5.1)
Sodium: 137 mmol/L (ref 135–145)

## 2020-01-16 LAB — SARS CORONAVIRUS 2 (TAT 6-24 HRS): SARS Coronavirus 2: NEGATIVE

## 2020-01-16 NOTE — Telephone Encounter (Signed)
Forwarding this to [facility] to resend order, thanks

## 2020-01-16 NOTE — Telephone Encounter (Signed)
Order faxed to [facility]

## 2020-01-16 NOTE — Telephone Encounter (Signed)
Fax confirmation received. 

## 2020-01-17 ENCOUNTER — Telehealth: Payer: Self-pay | Admitting: *Deleted

## 2020-01-17 NOTE — Telephone Encounter (Signed)
Error

## 2020-01-17 NOTE — Telephone Encounter (Signed)
Pt contacted pre-catheterization scheduled at [facility] for: Friday January 18, 2020 10:30 AM Verified arrival time and place: [facility] Main Entrance [facility]) at: 8:30 AM   No solid food after midnight prior to cath, clear liquids until 5 AM day of procedure.  AM meds can be  taken pre-cath with sip of water including: ASA 81 mg   Confirmed patient has responsible adult to drive home post procedure and observe 24 hours after arriving home: yes  Currently, due to Covid-19 pandemic, only one person will be allowed with patient. Must be the same person for patient's entire stay and will be required to wear a mask. They will be asked to wait in the waiting room for the duration of the patient's stay.  Patients are required to wear a mask when they enter the hospital.     COVID-19 Pre-Screening Questions:  . In the past 7 to 10 days have you had a cough,  shortness of breath, headache, congestion, fever (100 or greater) body aches, chills, sore throat, or sudden loss of taste or sense of smell? no . Have you been around anyone with known Covid 19 in the past 7-10 days? no . Have you been around anyone who is awaiting Covid 19 test results in the past 7 to 10 days? no . Have you been around anyone who has mentioned symptoms of Covid 19 in the past 7-10 days? no  Reviewed procedure/mask/visitor instructions, COVID-19 screening questions with patient.

## 2020-01-18 ENCOUNTER — Ambulatory Visit (HOSPITAL_COMMUNITY)
Admission: RE | Admit: 2020-01-18 | Discharge: 2020-01-18 | Disposition: A | Discharge status: Home / Self Care | Payer: Medicaid Other | Attending: Internal Medicine | Admitting: Internal Medicine

## 2020-01-18 ENCOUNTER — Ambulatory Visit (HOSPITAL_COMMUNITY)
Admission: RE | Disposition: A | Discharge status: Home / Self Care | Payer: Self-pay | Source: Home / Self Care | Attending: Internal Medicine

## 2020-01-18 ENCOUNTER — Other Ambulatory Visit: Payer: Self-pay

## 2020-01-18 DIAGNOSIS — Z955 Presence of coronary angioplasty implant and graft: Secondary | ICD-10-CM | POA: Diagnosis not present

## 2020-01-18 DIAGNOSIS — E785 Hyperlipidemia, unspecified: Secondary | ICD-10-CM | POA: Insufficient documentation

## 2020-01-18 DIAGNOSIS — I25110 Atherosclerotic heart disease of native coronary artery with unstable angina pectoris: Secondary | ICD-10-CM

## 2020-01-18 DIAGNOSIS — I252 Old myocardial infarction: Secondary | ICD-10-CM | POA: Insufficient documentation

## 2020-01-18 DIAGNOSIS — Z7982 Long term (current) use of aspirin: Secondary | ICD-10-CM | POA: Diagnosis not present

## 2020-01-18 DIAGNOSIS — F17210 Nicotine dependence, cigarettes, uncomplicated: Secondary | ICD-10-CM | POA: Insufficient documentation

## 2020-01-18 DIAGNOSIS — Z6841 Body Mass Index (BMI) 40.0 and over, adult: Secondary | ICD-10-CM | POA: Insufficient documentation

## 2020-01-18 DIAGNOSIS — I2582 Chronic total occlusion of coronary artery: Secondary | ICD-10-CM | POA: Insufficient documentation

## 2020-01-18 DIAGNOSIS — G4733 Obstructive sleep apnea (adult) (pediatric): Secondary | ICD-10-CM | POA: Insufficient documentation

## 2020-01-18 DIAGNOSIS — Z880 Allergy status to penicillin: Secondary | ICD-10-CM | POA: Diagnosis not present

## 2020-01-18 DIAGNOSIS — E669 Obesity, unspecified: Secondary | ICD-10-CM | POA: Diagnosis not present

## 2020-01-18 DIAGNOSIS — R9439 Abnormal result of other cardiovascular function study: Secondary | ICD-10-CM | POA: Diagnosis present

## 2020-01-18 DIAGNOSIS — J449 Chronic obstructive pulmonary disease, unspecified: Secondary | ICD-10-CM | POA: Insufficient documentation

## 2020-01-18 DIAGNOSIS — I25118 Atherosclerotic heart disease of native coronary artery with other forms of angina pectoris: Secondary | ICD-10-CM | POA: Insufficient documentation

## 2020-01-18 DIAGNOSIS — I200 Unstable angina: Secondary | ICD-10-CM | POA: Diagnosis present

## 2020-01-18 DIAGNOSIS — Z79899 Other long term (current) drug therapy: Secondary | ICD-10-CM | POA: Diagnosis not present

## 2020-01-18 HISTORY — PX: LEFT HEART CATH AND CORONARY ANGIOGRAPHY: CATH118249

## 2020-01-18 SURGERY — LEFT HEART CATH AND CORONARY ANGIOGRAPHY
Anesthesia: LOCAL

## 2020-01-18 MED ORDER — ISOSORBIDE MONONITRATE ER 30 MG PO TB24: 30.0000 mg | ORAL_TABLET | Freq: Every day | ORAL | 11 refills | Status: DC

## 2020-01-18 MED ORDER — MIDAZOLAM HCL 2 MG/2ML IJ SOLN
INTRAMUSCULAR | Status: AC
  Filled 2020-01-18: qty 2

## 2020-01-18 MED ORDER — VERAPAMIL HCL 2.5 MG/ML IV SOLN
INTRAVENOUS | Status: AC
  Filled 2020-01-18: qty 2

## 2020-01-18 MED ORDER — HEPARIN SODIUM (PORCINE) 1000 UNIT/ML IJ SOLN
INTRAMUSCULAR | Status: AC
  Filled 2020-01-18: qty 1

## 2020-01-18 MED ORDER — SODIUM CHLORIDE 0.9 % WEIGHT BASED INFUSION: 3.0000 mL/kg/h | INTRAVENOUS | Status: DC

## 2020-01-18 MED ORDER — HEPARIN (PORCINE) IN NACL 1000-0.9 UT/500ML-% IV SOLN
INTRAVENOUS | Status: DC | PRN
  Administered 2020-01-18 (×2): 500 mL

## 2020-01-18 MED ORDER — LIDOCAINE HCL (PF) 1 % IJ SOLN
INTRAMUSCULAR | Status: AC
  Filled 2020-01-18: qty 30

## 2020-01-18 MED ORDER — ASPIRIN 81 MG PO CHEW: 81.0000 mg | CHEWABLE_TABLET | ORAL | Status: DC

## 2020-01-18 MED ORDER — MIDAZOLAM HCL 2 MG/2ML IJ SOLN
INTRAMUSCULAR | Status: DC | PRN
  Administered 2020-01-18: 1 mg via INTRAVENOUS

## 2020-01-18 MED ORDER — IOHEXOL 350 MG/ML SOLN
INTRAVENOUS | Status: DC | PRN
  Administered 2020-01-18: 80 mL

## 2020-01-18 MED ORDER — SODIUM CHLORIDE 0.9 % IV SOLN: 250.0000 mL | INTRAVENOUS | Status: DC | PRN

## 2020-01-18 MED ORDER — SODIUM CHLORIDE 0.9% FLUSH: 3.0000 mL | Freq: Two times a day (BID) | INTRAVENOUS | Status: DC

## 2020-01-18 MED ORDER — FENTANYL CITRATE (PF) 100 MCG/2ML IJ SOLN
INTRAMUSCULAR | Status: AC
  Filled 2020-01-18: qty 2

## 2020-01-18 MED ORDER — VERAPAMIL HCL 2.5 MG/ML IV SOLN
INTRAVENOUS | Status: DC | PRN
  Administered 2020-01-18: 10 mL via INTRA_ARTERIAL

## 2020-01-18 MED ORDER — SODIUM CHLORIDE 0.9 % WEIGHT BASED INFUSION
1.0000 mL/kg/h | INTRAVENOUS | Status: DC
  Administered 2020-01-18: 1 mL/kg/h via INTRAVENOUS

## 2020-01-18 MED ORDER — HEPARIN (PORCINE) IN NACL 1000-0.9 UT/500ML-% IV SOLN
INTRAVENOUS | Status: AC
  Filled 2020-01-18: qty 1000

## 2020-01-18 MED ORDER — FENTANYL CITRATE (PF) 100 MCG/2ML IJ SOLN
INTRAMUSCULAR | Status: DC | PRN
  Administered 2020-01-18: 50 ug via INTRAVENOUS

## 2020-01-18 MED ORDER — SODIUM CHLORIDE 0.9% FLUSH: 3.0000 mL | INTRAVENOUS | Status: DC | PRN

## 2020-01-18 MED ORDER — HEPARIN SODIUM (PORCINE) 1000 UNIT/ML IJ SOLN
INTRAMUSCULAR | Status: DC | PRN
  Administered 2020-01-18: 5000 [IU] via INTRAVENOUS

## 2020-01-18 SURGICAL SUPPLY — 10 items
CATH INFINITI 5FR ANG PIGTAIL (CATHETERS) ×2 IMPLANT
CATH OPTITORQUE TIG 4.0 5F (CATHETERS) ×2 IMPLANT
DEVICE RAD COMP TR BAND LRG (VASCULAR PRODUCTS) ×2 IMPLANT
GLIDESHEATH SLEND SS 6F .021 (SHEATH) ×2 IMPLANT
GUIDEWIRE INQWIRE 1.5J.035X260 (WIRE) ×1 IMPLANT
INQWIRE 1.5J .035X260CM (WIRE) ×2
KIT HEART LEFT (KITS) ×2 IMPLANT
PACK CARDIAC CATHETERIZATION (CUSTOM PROCEDURE TRAY) ×2 IMPLANT
TRANSDUCER W/STOPCOCK (MISCELLANEOUS) ×2 IMPLANT
TUBING CIL FLEX 10 FLL-RA (TUBING) ×2 IMPLANT

## 2020-01-18 NOTE — Discharge Instructions (Signed)
Drink plenty of fluids for 48 hours and keep wrist elevated at heart level for 24 hours  Radial Site Care   This sheet gives you information about how to care for yourself after your procedure. Your health care provider may also give you more specific instructions. If you have problems or questions, contact your health care provider. What can I expect after the procedure? After the procedure, it is common to have:  Bruising and tenderness at the catheter insertion area. Follow these instructions at home: Medicines  Take over-the-counter and prescription medicines only as told by your health care provider. Insertion site care 1. Follow instructions from your health care provider about how to take care of your insertion site. Make sure you: ? Wash your hands with soap and water before you change your bandage (dressing). If soap and water are not available, use hand sanitizer. ? Remove your dressing as told by your health care provider. In 24 hours 2. Check your insertion site every day for signs of infection. Check for: ? Redness, swelling, or pain. ? Fluid or blood. ? Pus or a bad smell. ? Warmth. 3. Do not take baths, swim, or use a hot tub until your health care provider approves. 4. You may shower 24-48 hours after the procedure, or as directed by your health care provider. ? Remove the dressing and gently wash the site with plain soap and water. ? Pat the area dry with a clean towel. ? Do not rub the site. That could cause bleeding. 5. Do not apply powder or lotion to the site. Activity   1. For 24 hours after the procedure, or as directed by your health care provider: ? Do not flex or bend the affected arm. ? Do not push or pull heavy objects with the affected arm. ? Do not drive yourself home from the hospital or clinic. You may drive 24 hours after the procedure unless your health care provider tells you not to. ? Do not operate machinery or power tools. 2. Do not lift  anything that is heavier than 10 lb (4.5 kg), or the limit that you are told, until your health care provider says that it is safe.  For 4 days 3. Ask your health care provider when it is okay to: ? Return to work or school. ? Resume usual physical activities or sports. ? Resume sexual activity. General instructions  If the catheter site starts to bleed, raise your arm and put firm pressure on the site. If the bleeding does not stop, get help right away. This is a medical emergency.  If you went home on the same day as your procedure, a responsible adult should be with you for the first 24 hours after you arrive home.  Keep all follow-up visits as told by your health care provider. This is important. Contact a health care provider if:  You have a fever.  You have redness, swelling, or yellow drainage around your insertion site. Get help right away if:  You have unusual pain at the radial site.  The catheter insertion area swells very fast.  The insertion area is bleeding, and the bleeding does not stop when you hold steady pressure on the area.  Your arm or hand becomes pale, cool, tingly, or numb. These symptoms may represent a serious problem that is an emergency. Do not wait to see if the symptoms will go away. Get medical help right away. Call your local emergency services (911 in the [place] Do   not drive yourself to the hospital. Summary  After the procedure, it is common to have bruising and tenderness at the site.  Follow instructions from your health care provider about how to take care of your radial site wound. Check the wound every day for signs of infection.  Do not lift anything that is heavier than 10 lb (4.5 kg), or the limit that you are told, until your health care provider says that it is safe. This information is not intended to replace advice given to you by your health care provider. Make sure you discuss any questions you have with your health care  provider. Document Revised: 11/09/2017 Document Reviewed: 11/09/2017 Elsevier Patient Education  2020 [employer] Inc[employer]  

## 2020-01-18 NOTE — Progress Notes (Signed)
Discharge instructions reviewed with patient and patient's girlfriend Robin. Verbalized understanding and all questions answered.

## 2020-01-18 NOTE — Interval H&P Note (Signed)
History and Physical Interval Note:  01/18/2020 11:48 AM  Chad Gillespie  has presented today for cardiac catheterization, with the diagnosis of accelerating angina and abnormal stress test.  The various methods of treatment have been discussed with the patient and family. After consideration of risks, benefits and other options for treatment, the patient has consented to  Procedure(s): LEFT HEART CATH AND CORONARY ANGIOGRAPHY (N/A) as a surgical intervention.  The patient's history has been reviewed, patient examined, no change in status, stable for surgery.  I have reviewed the patient's chart and labs.  Questions were answered to the patient's satisfaction.    Cath Lab Visit (complete for each Cath Lab visit)  Clinical Evaluation Leading to the Procedure:   ACS: No.  Non-ACS:    Anginal Classification: CCS III  Anti-ischemic medical therapy: No Therapy  Non-Invasive Test Results: Intermediate-risk stress test findings: cardiac mortality 1-3%/year  Prior CABG: No previous CABG  Kimley Apsey

## 2020-01-21 ENCOUNTER — Telehealth: Payer: Self-pay

## 2020-01-21 ENCOUNTER — Telehealth: Payer: Self-pay | Admitting: Pulmonary Disease

## 2020-01-21 DIAGNOSIS — G4733 Obstructive sleep apnea (adult) (pediatric): Secondary | ICD-10-CM

## 2020-01-21 MED FILL — Lidocaine HCl Local Preservative Free (PF) Inj 1%: INTRAMUSCULAR | Qty: 30 | Status: AC

## 2020-01-21 NOTE — Telephone Encounter (Signed)
I am unable to close out this phone note.  I got message that there is an unsigned order??  Will route back to triage.

## 2020-01-21 NOTE — Telephone Encounter (Signed)
Pt sent an email back and stated the order needs sent to [facility] pharmacy instead. Did we need to send another order?

## 2020-01-21 NOTE — Telephone Encounter (Signed)
Clarified to patient that they are trying medical management first with Imdur.if unsuccessful, he will have PCI.

## 2020-01-21 NOTE — Telephone Encounter (Signed)
Pt has questions JY:LTEI on Friday, 01-18-20   Please call [phone]  Thanks renee

## 2020-01-21 NOTE — Telephone Encounter (Signed)
I called [facility] and spoke to [place].  He states this order can be used.  I am faxing to them now.  Nothing further needed.

## 2020-01-21 NOTE — Addendum Note (Signed)
Addended by: Velvet Bathe on: 01/21/2020 03:16 PM   Modules accepted: Orders

## 2020-01-21 NOTE — Telephone Encounter (Signed)
There is an e-mail message regarding this. PCC"s do you know of any other DME companies in [place] or near [place]? Please advise.

## 2020-01-24 NOTE — Telephone Encounter (Signed)
Spoke with patient. Patient has been having chest pain off and on since his cardiac procedure on 01/18/2020. Patient describes the pain as "sharp" and located at the top of his chest. Patient reports dyspnea chronically but did report that the dyspnea is worse when he has the chest pain. Patient has dizziness associated with the pain as well.   Patient is not having chest pain at time of phone call. He did have chest pain earlier today which resolved on its own.  Patient has PRN nitroglycerine. Instructed on medication directions.   If patient develops chest pain again he will go to the nearest emergency room. Patient placed on schedule for Dr. Antoine Poche at 10:20am on Monday 01/28/2020.

## 2020-01-27 DIAGNOSIS — Z7189 Other specified counseling: Secondary | ICD-10-CM | POA: Insufficient documentation

## 2020-01-27 NOTE — Progress Notes (Signed)
Cardiology Office Note   Date:  01/28/2020   ID:  Chad Gillespie, DOB 1966-10-07, MRN [id number]  PCP:  [name], NP  Cardiologist:   Rollene Rotunda, MD   No chief complaint on file.     History of Present Illness: Chad Gillespie is a 54 y.o. male who presents for follow up of CAD. He had CAD (s/p NSTEMI in 01/2017 with DESx2 to RCA).  He had stopped his meds in 2018.    He had chest pain recently and had a cardiac cath with results as below.  He was managed medically.  He has continued to get chest discomfort sporadically.  Sometimes it happens at rest.  It does not seem to always be reproducible with activity.  He has had to take nitroglycerin.  It is as described previously.  It does go away with nitroglycerin.  He is not having any new shortness of breath, PND or orthopnea.  He has no new palpitations, presyncope or syncope.  He has a little discomfort in his right radial site   Past Medical History:  Diagnosis Date  . CAD (coronary artery disease)    a. s/p NSTEMI in 01/2017 with DESx2 to RCA  . MI (myocardial infarction) (HCC)   . Tobacco abuse     Past Surgical History:  Procedure Laterality Date  . CORONARY STENT INTERVENTION N/A 02/04/2017   Procedure: Coronary Stent Intervention;  Surgeon: Peter M Swaziland, MD;  Location: [facility] INVASIVE CV LAB;  Service: Cardiovascular;  Laterality: N/A;  . ESOPHAGOGASTRODUODENOSCOPY (EGD) WITH ESOPHAGEAL DILATION    . LEFT HEART CATH AND CORONARY ANGIOGRAPHY N/A 02/04/2017   Procedure: Left Heart Cath and Coronary Angiography;  Surgeon: Peter M Swaziland, MD;  Location: [facility] INVASIVE CV LAB;  Service: Cardiovascular;  Laterality: N/A;  . LEFT HEART CATH AND CORONARY ANGIOGRAPHY N/A 01/18/2020   Procedure: LEFT HEART CATH AND CORONARY ANGIOGRAPHY;  Surgeon: Yvonne Kendall, MD;  Location: [facility] INVASIVE CV LAB;  Service: Cardiovascular;  Laterality: N/A;     Current Outpatient Medications  Medication Sig Dispense Refill  .  albuterol (VENTOLIN HFA) 108 (90 Base) MCG/ACT inhaler INHALE 2 PUFFS INTO THE LUNGS EVERY 6 HOURS AS NEEDED FOR WHEEZING OR SHORTNESS OF BREATH (Patient taking differently: Inhale 2 puffs into the lungs every 6 (six) hours as needed for wheezing or shortness of breath. ) 6.7 g 1  . aspirin 81 MG chewable tablet Chew 1 tablet (81 mg total) by mouth daily. 30 tablet 11  . atorvastatin (LIPITOR) 80 MG tablet Take 1 tablet (80 mg total) by mouth daily at 6 PM. 30 tablet 11  . gabapentin (NEURONTIN) 100 MG capsule TAKE 1 CAPSULE(100 MG) BY MOUTH THREE TIMES DAILY (Patient taking differently: Take 100 mg by mouth 3 (three) times daily. ) 90 capsule 1  . isosorbide mononitrate (IMDUR) 30 MG 24 hr tablet Take 1 tablet (30 mg total) by mouth daily. 30 tablet 11  . nicotine (NICODERM CQ) 14 mg/24hr patch Place 1 patch (14 mg total) onto the skin daily. 28 patch 0  . nicotine (NICODERM CQ) 21 mg/24hr patch Place 1 patch (21 mg total) onto the skin daily. 28 patch 0  . nicotine (NICODERM CQ) 7 mg/24hr patch Place 1 patch (7 mg total) onto the skin daily. 28 patch 0  . nitroGLYCERIN (NITROSTAT) 0.4 MG SL tablet Place 1 tablet (0.4 mg total) under the tongue every 5 (five) minutes as needed for chest pain. 50 tablet 3  .  Tiotropium Bromide Monohydrate (SPIRIVA RESPIMAT) 2.5 MCG/ACT AERS Inhale 2 puffs into the lungs daily. 8 g 5  . Tiotropium Bromide-Olodaterol (STIOLTO RESPIMAT) 2.5-2.5 MCG/ACT AERS Inhale 2 puffs into the lungs daily. 4 g 6   No current facility-administered medications for this visit.    Allergies:   Penicillins    ROS:  Please see the history of present illness.   Otherwise, review of systems are positive for none.   All other systems are reviewed and negative.    PHYSICAL EXAM: VS:  BP (!) 142/80   Pulse 64   Ht 6' (1.829 m)   Wt (!) 321 lb 6.4 oz (145.8 kg)   BMI 43.59 kg/m  , BMI Body mass index is 43.59 kg/m. GENERAL:  Well appearing NECK:  No jugular venous distention,  waveform within normal limits, carotid upstroke brisk and symmetric, no bruits, no thyromegaly LUNGS:  Clear to auscultation bilaterally CHEST:  Unremarkable HEART:  PMI not displaced or sustained,S1 and S2 within normal limits, no S3, no S4, no clicks, no rubs, no murmurs ABD:  Flat, positive bowel sounds normal in frequency in pitch, no bruits, no rebound, no guarding, no midline pulsatile mass, no hepatomegaly, no splenomegaly EXT:  2 plus pulses throughout, no edema, no cyanosis no clubbing, right radial site with slight ecchymosis but no pulsatile mass or erythema or drainage.   EKG:  EKG is ordered today. The ekg ordered today demonstrates sinus rhythm, rate 64, axis within normal limits, intervals within normal limits, old inferior infarct, no acute ST-T wave changes.   Recent Labs: 10/23/2019: ALT 23 01/16/2020: BUN 8; Creatinine, Ser 0.91; Hemoglobin 16.5; Platelets 289; Potassium 4.1; Sodium 137    Lipid Panel    Component Value Date/Time   CHOL 202 (H) 10/23/2019 1030   TRIG 100 10/23/2019 1030   HDL 30 (L) 10/23/2019 1030   CHOLHDL 6.7 (H) 10/23/2019 1030   CHOLHDL 5.3 02/04/2017 0933   VLDL 17 02/04/2017 0933   LDLCALC 154 (H) 10/23/2019 1030      Wt Readings from Last 3 Encounters:  01/28/20 (!) 321 lb 6.4 oz (145.8 kg)  01/18/20 (!) 319 lb 0.1 oz (144.7 kg)  12/27/19 (!) 316 lb (143.3 kg)    Diagnostic  Cath Dominance: Right    Other studies Reviewed: Additional studies/ records that were reviewed today include: Cath images reviewed in the room with the patient. Review of the above records demonstrates:  Please see elsewhere in the note.     ASSESSMENT AND PLAN:  CAD: The patient continues to have some chest pain.  I am going to increase his Imdur to 120 mg daily.  He will let us know if he has any increasing symptoms.  He needs medical management for the disease as described above.  HLD: He was not taking a statin but started again in January so I will  get another lipid profile today with goal LDL less than 70.  COPD: He is being followed by pulmonary and has had recent adjustments to his medications.  OSA: He was to be followed by pulmonary for upcoming sleep study.  TOBACCO ABUSE: He is trying to quit smoking and is using the patches.  We talked about this today.  COVID EDUCATION: I instructed him to go get the Covid vaccine and he is willing.  He understands the importance.   Current medicines are reviewed at length with the patient today.  The patient does not have concerns regarding medicines.  The following changes  have been made:  As above  Labs/ tests ordered today include:   Orders Placed This Encounter  Procedures  . EKG 12-Lead     Disposition:   FU with Bernerd Pho PA in about two months.     Signed, [name], MD  01/28/2020 10:08 AM    [facility]

## 2020-01-28 ENCOUNTER — Ambulatory Visit (INDEPENDENT_AMBULATORY_CARE_PROVIDER_SITE_OTHER): Payer: Medicaid Other | Admitting: Cardiology

## 2020-01-28 ENCOUNTER — Other Ambulatory Visit: Payer: Self-pay

## 2020-01-28 ENCOUNTER — Encounter: Payer: Self-pay | Admitting: Cardiology

## 2020-01-28 VITALS — BP 142/80 | HR 64 | Ht 72.0 in | Wt 321.4 lb

## 2020-01-28 DIAGNOSIS — G4733 Obstructive sleep apnea (adult) (pediatric): Secondary | ICD-10-CM

## 2020-01-28 DIAGNOSIS — R072 Precordial pain: Secondary | ICD-10-CM

## 2020-01-28 DIAGNOSIS — E785 Hyperlipidemia, unspecified: Secondary | ICD-10-CM | POA: Diagnosis not present

## 2020-01-28 DIAGNOSIS — Z7189 Other specified counseling: Secondary | ICD-10-CM | POA: Diagnosis not present

## 2020-01-28 DIAGNOSIS — I25110 Atherosclerotic heart disease of native coronary artery with unstable angina pectoris: Secondary | ICD-10-CM | POA: Diagnosis not present

## 2020-01-28 LAB — HEPATIC FUNCTION PANEL
ALT: 24 IU/L (ref 0–44)
AST: 23 IU/L (ref 0–40)
Albumin: 4.1 g/dL (ref 3.8–4.9)
Alkaline Phosphatase: 142 IU/L — ABNORMAL HIGH (ref 39–117)
Bilirubin Total: 0.4 mg/dL (ref 0.0–1.2)
Bilirubin, Direct: 0.13 mg/dL (ref 0.00–0.40)
Total Protein: 7 g/dL (ref 6.0–8.5)

## 2020-01-28 LAB — LIPID PANEL
Chol/HDL Ratio: 4.4 ratio (ref 0.0–5.0)
Cholesterol, Total: 120 mg/dL (ref 100–199)
HDL: 27 mg/dL — ABNORMAL LOW (ref >39)
LDL Chol Calc (NIH): 78 mg/dL (ref 0–99)
Triglycerides: 76 mg/dL (ref 0–149)
VLDL Cholesterol Cal: 15 mg/dL (ref 5–40)

## 2020-01-28 MED ORDER — ISOSORBIDE MONONITRATE ER 120 MG PO TB24: 120.0000 mg | ORAL_TABLET | Freq: Every day | ORAL | 11 refills | Status: DC

## 2020-01-28 NOTE — Patient Instructions (Addendum)
Medication Instructions:  INCREASE IMDUR TO 120MG  DAILY *If you need a refill on your cardiac medications before your next appointment, please call your pharmacy*  Lab Work: Your physician recommends that you return for lab work today (lipids/liver) If you have labs (blood work) drawn today and your tests are completely normal, you will receive your results only by: MyChart Message (if you have MyChart) OR . A paper copy in the mail If you have any lab test that is abnormal or we need to change your treatment, we will call you to review the results.  Testing/Procedures: None ordered this visit  Follow-Up: At [facility], you and your health needs are our priority.  As part of our continuing mission to provide you with exceptional heart care, we have created designated Provider Care Teams.  These Care Teams include your primary Cardiologist (physician) and Advanced Practice Providers (APPs -  Physician Assistants and Nurse Practitioners) who all work together to provide you with the care you need, when you need it.  Your next appointment:   2 month(s) in [place]  The format for your next appointment:   In Person  Provider:   Garrison, Randall An  Other Instructions Please get your COVID vaccine

## 2020-02-12 ENCOUNTER — Encounter (INDEPENDENT_AMBULATORY_CARE_PROVIDER_SITE_OTHER): Payer: Self-pay | Admitting: Primary Care

## 2020-02-12 NOTE — Telephone Encounter (Signed)
This is best addressed by PCP since we only see him for OSA

## 2020-02-12 NOTE — Telephone Encounter (Signed)
Dr. Alva please advise. Thanks. 

## 2020-02-15 ENCOUNTER — Other Ambulatory Visit (HOSPITAL_COMMUNITY): Payer: Medicaid Other

## 2020-02-19 ENCOUNTER — Ambulatory Visit (INDEPENDENT_AMBULATORY_CARE_PROVIDER_SITE_OTHER): Payer: Medicaid Other | Admitting: Family Medicine

## 2020-02-19 ENCOUNTER — Encounter (INDEPENDENT_AMBULATORY_CARE_PROVIDER_SITE_OTHER): Payer: Self-pay | Admitting: Family Medicine

## 2020-02-19 ENCOUNTER — Other Ambulatory Visit: Payer: Self-pay

## 2020-02-19 VITALS — BP 122/84 | HR 93 | Temp 97.3°F | Ht 72.0 in | Wt 328.8 lb

## 2020-02-19 DIAGNOSIS — R7303 Prediabetes: Secondary | ICD-10-CM | POA: Diagnosis not present

## 2020-02-19 DIAGNOSIS — H538 Other visual disturbances: Secondary | ICD-10-CM

## 2020-02-19 LAB — POCT GLYCOSYLATED HEMOGLOBIN (HGB A1C): Hemoglobin A1C: 6.2 % — AB (ref 4.0–5.6)

## 2020-02-19 NOTE — Patient Instructions (Addendum)
Contact [facility] to schedule an eye appointment for evaluation of left eye blurring of vision.   [place] [address], [place], [place] [address] [phone]  I confirmed with the practice that they accept your medicaid.  Your A1C is stable today.     Visual Disturbances  A visual disturbance is any problem that interferes with your normal vision. This can affect one eye or both eyes. Some types of visual disturbances come and go without treatment and do not cause a permanent problem. Other visual disturbances may be a sign of a medical emergency. Visual disturbances include:  Blurred vision.  Being unable to see certain colors.  Being sensitive to light.  Double vision.  Partial vision loss (visual field deficit).  Being unaware of objects on one side of the body (visual spatial inattention).  Rhythmic eye movements that you cannot control (nystagmus).  Short-term or long-term blindness.  Seeing: ? Floating spots or lines (floaters). ? Flashing or shimmering lights. ? Zigzagging lines or stars. ? The floor as tilted (visual midline shift). ? Things that are not really there (hallucinations). Causes of visual disturbances include:  Eye infection.  The thin membrane at the back of the eye separating from the eyeball (retinal detachment).  High blood pressure.  Migraine.  Glaucoma.  Ischemic stroke.  Cerebral aneurysm. It is important to get your eyes checked by a health care provider or eye specialist (ophthalmologist or optometrist) as soon as possible to determine the cause of your visual disturbance. Follow these instructions at home:  Take over-the-counter and prescription medicines only as told by your health care provider.  Do not use any products that contain nicotine or tobacco, such as cigarettes and e-cigarettes. If you need help quitting, ask your health care provider.  To lower your risk of the problems that can lead to  visual disturbances: ? Eat a balanced diet that includes fruits and vegetables, whole grains, lean meat, and low-fat dairy. ? Maintain a healthy weight. Work with your health care provider to lose weight if you need to. ? Exercise regularly. Ask your health care provider what activities are safe for you.  Do not drive if you have trouble seeing. Ask your health care provider for guidance about when it is and is not safe for you to drive.  Keep all follow-up visits as told by your health care provider. This is important. Contact a health care provider if:  Your visual disturbance changes or becomes worse. Get help right away if you:   Have new visual disturbances.  Suddenly see flashing lights or floaters.  Suddenly have a dark area in your field of vision, especially in the lower part. This can lead to a loss of central vision.  Lose vision in one or both eyes.  Have any symptoms of a stroke. "BE FAST" is an easy way to remember the main warning signs of a stroke: ? B - Balance. Signs are dizziness, sudden trouble walking, or loss of balance. ? E - Eyes. Signs are trouble seeing or a sudden change in vision. ? F - Face. Signs are sudden weakness or numbness of the face, or the face or eyelid drooping on one side. ? A - Arms. Signs are weakness or numbness in an arm. This happens suddenly and usually on one side of the body. ? S - Speech. Signs are sudden trouble speaking, slurred speech, or trouble understanding what people say. ? T - Time. Time to call  emergency services. Write down what time symptoms started.  Have other signs of a stroke, such as: ? A sudden, severe headache with no known cause. ? Nausea or vomiting. ? Seizure. These symptoms may represent a serious problem that is an emergency. Do not wait to see if the symptoms will go away. Get medical help right away. Call your local emergency services (911 in the [place] Do not drive yourself to the hospital. Summary  A  visual disturbance is any problem that interferes with your normal vision.  Some visual disturbances may be a sign of a medical emergency.  It is important to get your eyes checked by a health care provider or eye specialist to determine what kind of visual disturbance you have. This information is not intended to replace advice given to you by your health care provider. Make sure you discuss any questions you have with your health care provider. Document Revised: 01/23/2019 Document Reviewed: 10/25/2017 Elsevier Patient Education  2020 [employer].

## 2020-02-19 NOTE — Progress Notes (Signed)
Patient ID: Chad Gillespie, male    DOB: [birth year], 54 y.o.   MRN: [id number]  PCP: [name], NP  Chief Complaint  Patient presents with  . Blurred Vision    Subjective:  HPI  Chad Gillespie is a 54 y.o. male presents for evaluation of blurred vision. Medical problems prediabetes, morbid obesity, and CAD Patient developed blurring of vision in the left eye when staring straight ahead x 2 weeks ago. The problem has not improved. Denies presence of FB. He has noticed crusting in both eye, however the vision in the right eye is unaffected. Patient has not seen an eye doctor in several years and has been prescribed corrective lens to wear. Denies dizziness, headache, or injury to eye.   Social History   Socioeconomic History  . Marital status: Single    Spouse name: Not on file  . Number of children: Not on file  . Years of education: Not on file  . Highest education level: Not on file  Occupational History  . Not on file  Tobacco Use  . Smoking status: Current Every Day Smoker    Packs/day: 0.10    Types: Cigarettes    Start date: 04/11/1986  . Smokeless tobacco: Never Used  Substance and Sexual Activity  . Alcohol use: Yes    Comment: occasionally   . Drug use: No  . Sexual activity: Not on file  Other Topics Concern  . Not on file  Social History Narrative  . Not on file   Social Determinants of Health   Financial Resource Strain:   . Difficulty of Paying Living Expenses:   Food Insecurity:   . Worried About Charity fundraiser in the Last Year:   . Arboriculturist in the Last Year:   Transportation Needs:   . Film/video editor (Medical):   Marland Kitchen Lack of Transportation (Non-Medical):   Physical Activity:   . Days of Exercise per Week:   . Minutes of Exercise per Session:   Stress:   . Feeling of Stress :   Social Connections:   . Frequency of Communication with Friends and Family:   . Frequency of Social Gatherings with Friends and Family:   .  Attends Religious Services:   . Active Member of Clubs or Organizations:   . Attends Archivist Meetings:   Marland Kitchen Marital Status:   Intimate Partner Violence:   . Fear of Current or Ex-Partner:   . Emotionally Abused:   Marland Kitchen Physically Abused:   . Sexually Abused:     Family History  Problem Relation Age of Onset  . Hypertension Mother   . Cancer Father   . Heart attack Father   . Diabetes Father   . Diabetes Sister   . Cancer Sister    Review of Systems  Pertinent negatives listed in HPI  Patient Active Problem List   Diagnosis Date Noted  . Educated about COVID-19 virus infection 01/27/2020  . Accelerating angina ([place]) 01/18/2020  . Abnormal stress test 01/18/2020  . OSA (obstructive sleep apnea) 12/26/2019  . Leg swelling 06/14/2017  . Hyperlipidemia 06/14/2017  . Essential hypertension 06/14/2017  . Coronary artery disease involving native coronary artery of native heart without angina pectoris 06/14/2017  . Medication management 06/14/2017  . Abnormal PFT 06/14/2017  . Prediabetes 02/10/2017  . NSTEMI (non-ST elevated myocardial infarction) ([place]) 02/03/2017  . Chest pain 02/03/2017  . Tobacco abuse 02/03/2017  . Obesity 02/03/2017  . Leukocytosis 02/03/2017  .  Hypoxia 02/03/2017    Allergies  Allergen Reactions  . Penicillins Hives    Has patient had a PCN reaction causing immediate rash, facial/tongue/throat swelling, SOB or lightheadedness with hypotension: Yes Has patient had a PCN reaction causing severe rash involving mucus membranes or skin necrosis: No Has patient had a PCN reaction that required hospitalization No Has patient had a PCN reaction occurring within the last 10 years: No If all of the above answers are "NO", then may proceed with Cephalosporin use.     Prior to Admission medications   Medication Sig Start Date End Date Taking? Authorizing Provider  albuterol (VENTOLIN HFA) 108 (90 Base) MCG/ACT inhaler INHALE 2 PUFFS INTO THE LUNGS  EVERY 6 HOURS AS NEEDED FOR WHEEZING OR SHORTNESS OF BREATH Patient taking differently: Inhale 2 puffs into the lungs every 6 (six) hours as needed for wheezing or shortness of breath.  12/14/19  Yes Hoy Register, MD  aspirin 81 MG chewable tablet Chew 1 tablet (81 mg total) by mouth daily. 11/29/19  Yes Strader, [place] M, PA-C  atorvastatin (LIPITOR) 80 MG tablet Take 1 tablet (80 mg total) by mouth daily at 6 PM. 11/29/19  Yes Strader, [place] M, PA-C  gabapentin (NEURONTIN) 100 MG capsule TAKE 1 CAPSULE(100 MG) BY MOUTH THREE TIMES DAILY Patient taking differently: Take 100 mg by mouth 3 (three) times daily.  12/25/19  Yes [name], NP  isosorbide mononitrate (IMDUR) 120 MG 24 hr tablet Take 1 tablet (120 mg total) by mouth daily. 01/28/20 01/27/21 Yes Hochrein, Fayrene Fearing, MD  nicotine (NICODERM CQ) 14 mg/24hr patch Place 1 patch (14 mg total) onto the skin daily. 11/14/19  Yes [name], NP  nitroGLYCERIN (NITROSTAT) 0.4 MG SL tablet Place 1 tablet (0.4 mg total) under the tongue every 5 (five) minutes as needed for chest pain. 11/24/19  Yes [name], NP  Tiotropium Bromide Monohydrate (SPIRIVA RESPIMAT) 2.5 MCG/ACT AERS Inhale 2 puffs into the lungs daily. 12/26/19  Yes [name], MD  Tiotropium Bromide-Olodaterol (STIOLTO RESPIMAT) 2.5-2.5 MCG/ACT AERS Inhale 2 puffs into the lungs daily. 01/15/20  Yes [name], MD  nicotine (NICODERM CQ) 21 mg/24hr patch Place 1 patch (21 mg total) onto the skin daily. Patient not taking: Reported on 02/19/2020 11/14/19   [name], NP  nicotine (NICODERM CQ) 7 mg/24hr patch Place 1 patch (7 mg total) onto the skin daily. Patient not taking: Reported on 02/19/2020 11/14/19   [name], NP   Past Medical, Surgical Family and Social History reviewed and updated.   Objective:   Today's Vitals   02/19/20 0943  BP: 122/84  Pulse: 93  Temp: (!) 97.3 F (36.3 C)  TempSrc: Temporal  SpO2: 93%  Weight: (!) 328  lb 12.8 oz (149.1 kg)  Height: 6' (1.829 m)  PainSc: 0-No pain    Wt Readings from Last 3 Encounters:  02/19/20 (!) 328 lb 12.8 oz (149.1 kg)  01/28/20 (!) 321 lb 6.4 oz (145.8 kg)  01/18/20 (!) 319 lb 0.1 oz (144.7 kg)    Physical Exam Constitutional:      Appearance: He is obese.  Eyes:     General: Visual field deficit present. No scleral icterus.       Right eye: No foreign body or discharge.        Left eye: No foreign body or discharge.     Extraocular Movements:     Right eye: Normal extraocular motion and no nystagmus.  Left eye: Normal extraocular motion and no nystagmus.     Conjunctiva/sclera: Conjunctivae normal.     Pupils: Pupils are equal, round, and reactive to light.   Cardiovascular:     Rate and Rhythm: Normal rate.  Pulmonary:     Effort: Pulmonary effort is normal.  Neurological:     Mental Status: He is alert.      No results found for: POCGLU  Lab Results  Component Value Date   HGBA1C 6.1 (A) 10/23/2019            Assessment & Plan:  1. Prediabetes -A1C 6.2, remains stable  -Encouraged weight loss to prevent diabetes progression.  2. Blurring of visual image of left eye -Follow-up information provided for [facility]     -The patient was given clear instructions to go to ER or return to medical center if symptoms do not improve, worsen or new problems develop. The patient verbalized understanding.    Joaquin Courts, FNP (Covering Provider) [facility]  [address]  [place], [place] [address] [phone]

## 2020-02-19 NOTE — Progress Notes (Signed)
@Patient  ID: , male    DOB: 09/14/1966, 54 y.o.   MRN: [id number]  No chief complaint on file.   Referring provider: [id number], NP  HPI:  54 year old male current smoker followed in our office for obstructive sleep apnea  PMH: History non-STEMI, prediabetes, hyperlipidemia, hypertension Smoker/ Smoking History: Current smoker Maintenance:   Pt of: Dr. 40  02/19/2020  - Visit   Patient never received CPAP so office visit was canceled. Patient receiving CPAP today.   Questionaires / Pulmonary Flowsheets:   ACT:  No flowsheet data found.  MMRC: No flowsheet data found.  Epworth:  Results of the Epworth flowsheet 12/26/2019  Sitting and reading 2  Watching TV 3  Sitting, inactive in a public place (e.g. a theatre or a meeting) 1  As a passenger in a car for an hour without a break 2  Lying down to rest in the afternoon when circumstances permit 3  Sitting and talking to someone 1  Sitting quietly after a lunch without alcohol 2  In a car, while stopped for a few minutes in traffic 0  Total score 14    Tests:   NPSG 05/2017 >> weight 323 pounds-AHI 95/hour, corrected by CPAP of 15 cm full facemask.  05/2017 PFTs moderate restriction, ratio 74, FEV1 69%, FVC 72%, TLC 74% with DLCO 53% corrects to 76 for alveolar volume  FENO:  No results found for: NITRICOXIDE  PFT: PFT Results Latest Ref Rng & Units 06/01/2017  FVC-Pre L 3.89  FVC-Predicted Pre % 72  FVC-Post L 3.90  FVC-Predicted Post % 72  Pre FEV1/FVC % % 74  Post FEV1/FCV % % 75  FEV1-Pre L 2.88  FEV1-Predicted Pre % 69  FEV1-Post L 2.92  DLCO UNC% % 53  DLCO COR %Predicted % 76  TLC L 5.50  TLC % Predicted % 74  RV % Predicted % 81    WALK:  No flowsheet data found.  Imaging: No results found.  Lab Results:  CBC    Component Value Date/Time   WBC 10.3 01/16/2020 0933   RBC 5.50 01/16/2020 0933   HGB 16.5 01/16/2020 0933   HGB 16.8 10/23/2019 1030   HCT 49.2  01/16/2020 0933   HCT 47.4 10/23/2019 1030   PLT 289 01/16/2020 0933   PLT 293 10/23/2019 1030   MCV 89.5 01/16/2020 0933   MCV 86 10/23/2019 1030   MCH 30.0 01/16/2020 0933   MCHC 33.5 01/16/2020 0933   RDW 12.7 01/16/2020 0933   RDW 12.3 10/23/2019 1030   LYMPHSABS 2.8 01/16/2020 0933   LYMPHSABS 2.8 10/23/2019 1030   MONOABS 0.8 01/16/2020 0933   EOSABS 0.3 01/16/2020 0933   EOSABS 0.3 10/23/2019 1030   BASOSABS 0.1 01/16/2020 0933   BASOSABS 0.1 10/23/2019 1030    BMET    Component Value Date/Time   NA 137 01/16/2020 0933   NA 139 10/23/2019 1030   K 4.1 01/16/2020 0933   CL 102 01/16/2020 0933   CO2 26 01/16/2020 0933   GLUCOSE 107 (H) 01/16/2020 0933   BUN 8 01/16/2020 0933   BUN 11 10/23/2019 1030   CREATININE 0.91 01/16/2020 0933   CALCIUM 9.3 01/16/2020 0933   GFRNONAA >60 01/16/2020 0933   GFRAA >60 01/16/2020 0933    BNP No results found for: BNP  ProBNP No results found for: PROBNP  Specialty Problems      Pulmonary Problems   Hypoxia   Abnormal PFT   OSA (obstructive  sleep apnea)      Allergies  Allergen Reactions  . Penicillins Hives    Has patient had a PCN reaction causing immediate rash, facial/tongue/throat swelling, SOB or lightheadedness with hypotension: Yes Has patient had a PCN reaction causing severe rash involving mucus membranes or skin necrosis: No Has patient had a PCN reaction that required hospitalization No Has patient had a PCN reaction occurring within the last 10 years: No If all of the above answers are "NO", then may proceed with Cephalosporin use.     Immunization History  Administered Date(s) Administered  . Tdap 03/22/2017    Past Medical History:  Diagnosis Date  . CAD (coronary artery disease)    a. s/p NSTEMI in 01/2017 with DESx2 to RCA  . MI (myocardial infarction) ([facility])   . Tobacco abuse     Tobacco History: Social History   Tobacco Use  Smoking Status Current Every Day Smoker  . Packs/day:  0.10  . Types: Cigarettes  . Start date: 04/11/1986  Smokeless Tobacco Never Used   Ready to quit: Not Answered Counseling given: Not Answered   Continue to not smoke  Outpatient Encounter Medications as of 02/20/2020  Medication Sig  . albuterol (VENTOLIN HFA) 108 (90 Base) MCG/ACT inhaler INHALE 2 PUFFS INTO THE LUNGS EVERY 6 HOURS AS NEEDED FOR WHEEZING OR SHORTNESS OF BREATH (Patient taking differently: Inhale 2 puffs into the lungs every 6 (six) hours as needed for wheezing or shortness of breath. )  . aspirin 81 MG chewable tablet Chew 1 tablet (81 mg total) by mouth daily.  Marland Kitchen atorvastatin (LIPITOR) 80 MG tablet Take 1 tablet (80 mg total) by mouth daily at 6 PM.  . gabapentin (NEURONTIN) 100 MG capsule TAKE 1 CAPSULE(100 MG) BY MOUTH THREE TIMES DAILY (Patient taking differently: Take 100 mg by mouth 3 (three) times daily. )  . isosorbide mononitrate (IMDUR) 120 MG 24 hr tablet Take 1 tablet (120 mg total) by mouth daily.  . nicotine (NICODERM CQ) 14 mg/24hr patch Place 1 patch (14 mg total) onto the skin daily.  . nicotine (NICODERM CQ) 21 mg/24hr patch Place 1 patch (21 mg total) onto the skin daily. (Patient not taking: Reported on 02/19/2020)  . nicotine (NICODERM CQ) 7 mg/24hr patch Place 1 patch (7 mg total) onto the skin daily. (Patient not taking: Reported on 02/19/2020)  . nitroGLYCERIN (NITROSTAT) 0.4 MG SL tablet Place 1 tablet (0.4 mg total) under the tongue every 5 (five) minutes as needed for chest pain.  . Tiotropium Bromide Monohydrate (SPIRIVA RESPIMAT) 2.5 MCG/ACT AERS Inhale 2 puffs into the lungs daily.  . Tiotropium Bromide-Olodaterol (STIOLTO RESPIMAT) 2.5-2.5 MCG/ACT AERS Inhale 2 puffs into the lungs daily.   No facility-administered encounter medications on file as of 02/20/2020.     Review of Systems  Review of Systems   Physical Exam  There were no vitals taken for this visit.  Wt Readings from Last 5 Encounters:  02/19/20 (!) 328 lb 12.8 oz (149.1 kg)    01/28/20 (!) 321 lb 6.4 oz (145.8 kg)  01/18/20 (!) 319 lb 0.1 oz (144.7 kg)  12/27/19 (!) 316 lb (143.3 kg)  12/26/19 (!) 314 lb 6.4 oz (142.6 kg)    BMI Readings from Last 5 Encounters:  02/19/20 44.59 kg/m  01/28/20 43.59 kg/m  01/18/20 43.26 kg/m  12/27/19 42.86 kg/m  12/26/19 42.64 kg/m     Physical Exam    Assessment & Plan:   No problem-specific Assessment & Plan notes found for  this encounter.    No follow-ups on file.   Lauraine Rinne, NP 02/19/2020   This appointment required 0 minutes of patient care (this includes precharting, chart review, review of results, face-to-face care, etc.).

## 2020-02-20 ENCOUNTER — Encounter: Payer: Self-pay | Admitting: Pulmonary Disease

## 2020-02-20 ENCOUNTER — Ambulatory Visit (INDEPENDENT_AMBULATORY_CARE_PROVIDER_SITE_OTHER): Payer: Medicaid Other | Admitting: Pulmonary Disease

## 2020-02-20 VITALS — Temp 98.4°F | Ht 72.0 in | Wt 327.0 lb

## 2020-02-20 DIAGNOSIS — G4733 Obstructive sleep apnea (adult) (pediatric): Secondary | ICD-10-CM

## 2020-02-25 ENCOUNTER — Inpatient Hospital Stay (HOSPITAL_COMMUNITY): Payer: Medicaid Other

## 2020-02-25 ENCOUNTER — Emergency Department (HOSPITAL_COMMUNITY): Payer: Medicaid Other

## 2020-02-25 ENCOUNTER — Observation Stay (HOSPITAL_COMMUNITY)
Admission: EM | Admit: 2020-02-25 | Discharge: 2020-02-26 | DRG: 125 | Disposition: A | Discharge status: Home / Self Care | Payer: Medicaid Other | Source: Ambulatory Visit | Attending: Internal Medicine | Admitting: Internal Medicine

## 2020-02-25 ENCOUNTER — Encounter (HOSPITAL_COMMUNITY): Payer: Self-pay | Admitting: Emergency Medicine

## 2020-02-25 ENCOUNTER — Other Ambulatory Visit: Payer: Self-pay

## 2020-02-25 DIAGNOSIS — I110 Hypertensive heart disease with heart failure: Secondary | ICD-10-CM | POA: Diagnosis not present

## 2020-02-25 DIAGNOSIS — G4733 Obstructive sleep apnea (adult) (pediatric): Secondary | ICD-10-CM | POA: Diagnosis not present

## 2020-02-25 DIAGNOSIS — E785 Hyperlipidemia, unspecified: Secondary | ICD-10-CM | POA: Diagnosis not present

## 2020-02-25 DIAGNOSIS — Z833 Family history of diabetes mellitus: Secondary | ICD-10-CM | POA: Diagnosis not present

## 2020-02-25 DIAGNOSIS — Z955 Presence of coronary angioplasty implant and graft: Secondary | ICD-10-CM | POA: Diagnosis not present

## 2020-02-25 DIAGNOSIS — Z20822 Contact with and (suspected) exposure to covid-19: Secondary | ICD-10-CM | POA: Diagnosis not present

## 2020-02-25 DIAGNOSIS — F17210 Nicotine dependence, cigarettes, uncomplicated: Secondary | ICD-10-CM | POA: Diagnosis present

## 2020-02-25 DIAGNOSIS — H539 Unspecified visual disturbance: Secondary | ICD-10-CM

## 2020-02-25 DIAGNOSIS — H5462 Unqualified visual loss, left eye, normal vision right eye: Principal | ICD-10-CM | POA: Diagnosis present

## 2020-02-25 DIAGNOSIS — H547 Unspecified visual loss: Secondary | ICD-10-CM | POA: Diagnosis present

## 2020-02-25 DIAGNOSIS — Z8249 Family history of ischemic heart disease and other diseases of the circulatory system: Secondary | ICD-10-CM

## 2020-02-25 DIAGNOSIS — E669 Obesity, unspecified: Secondary | ICD-10-CM | POA: Diagnosis present

## 2020-02-25 DIAGNOSIS — J449 Chronic obstructive pulmonary disease, unspecified: Secondary | ICD-10-CM | POA: Diagnosis present

## 2020-02-25 DIAGNOSIS — I252 Old myocardial infarction: Secondary | ICD-10-CM

## 2020-02-25 DIAGNOSIS — Z6841 Body Mass Index (BMI) 40.0 and over, adult: Secondary | ICD-10-CM | POA: Diagnosis not present

## 2020-02-25 DIAGNOSIS — I2510 Atherosclerotic heart disease of native coronary artery without angina pectoris: Secondary | ICD-10-CM

## 2020-02-25 DIAGNOSIS — Z880 Allergy status to penicillin: Secondary | ICD-10-CM

## 2020-02-25 DIAGNOSIS — Z720 Tobacco use: Secondary | ICD-10-CM

## 2020-02-25 DIAGNOSIS — Z79899 Other long term (current) drug therapy: Secondary | ICD-10-CM | POA: Diagnosis not present

## 2020-02-25 DIAGNOSIS — I6349 Cerebral infarction due to embolism of other cerebral artery: Secondary | ICD-10-CM | POA: Diagnosis not present

## 2020-02-25 DIAGNOSIS — Z803 Family history of malignant neoplasm of breast: Secondary | ICD-10-CM | POA: Diagnosis not present

## 2020-02-25 DIAGNOSIS — Z7982 Long term (current) use of aspirin: Secondary | ICD-10-CM | POA: Diagnosis not present

## 2020-02-25 DIAGNOSIS — I509 Heart failure, unspecified: Secondary | ICD-10-CM | POA: Diagnosis present

## 2020-02-25 DIAGNOSIS — I639 Cerebral infarction, unspecified: Secondary | ICD-10-CM

## 2020-02-25 LAB — COMPREHENSIVE METABOLIC PANEL
ALT: 29 U/L (ref 0–44)
AST: 20 U/L (ref 15–41)
Albumin: 3.7 g/dL (ref 3.5–5.0)
Alkaline Phosphatase: 111 U/L (ref 38–126)
Anion gap: 8 (ref 5–15)
BUN: 11 mg/dL (ref 6–20)
CO2: 27 mmol/L (ref 22–32)
Calcium: 9.2 mg/dL (ref 8.9–10.3)
Chloride: 101 mmol/L (ref 98–111)
Creatinine, Ser: 0.91 mg/dL (ref 0.61–1.24)
GFR calc Af Amer: >60 mL/min (ref >60)
GFR calc non Af Amer: >60 mL/min (ref >60)
Glucose, Bld: 91 mg/dL (ref 70–99)
Potassium: 3.8 mmol/L (ref 3.5–5.1)
Sodium: 136 mmol/L (ref 135–145)
Total Bilirubin: 0.6 mg/dL (ref 0.3–1.2)
Total Protein: 7.3 g/dL (ref 6.5–8.1)

## 2020-02-25 LAB — CBC
HCT: 46.6 % (ref 39.0–52.0)
Hemoglobin: 15.3 g/dL (ref 13.0–17.0)
MCH: 29.7 pg (ref 26.0–34.0)
MCHC: 32.8 g/dL (ref 30.0–36.0)
MCV: 90.5 fL (ref 80.0–100.0)
Platelets: 306 10*3/uL (ref 150–400)
RBC: 5.15 MIL/uL (ref 4.22–5.81)
RDW: 13.4 % (ref 11.5–15.5)
WBC: 9.1 10*3/uL (ref 4.0–10.5)
nRBC: 0 % (ref 0.0–0.2)

## 2020-02-25 LAB — APTT: aPTT: 42 seconds — ABNORMAL HIGH (ref 24–36)

## 2020-02-25 LAB — SARS CORONAVIRUS 2 BY RT PCR (HOSPITAL ORDER, PERFORMED IN ~~LOC~~ HOSPITAL LAB): SARS Coronavirus 2: NEGATIVE

## 2020-02-25 LAB — HM DIABETES EYE EXAM

## 2020-02-25 LAB — PROTIME-INR
INR: 1 (ref 0.8–1.2)
Prothrombin Time: 12.4 seconds (ref 11.4–15.2)

## 2020-02-25 MED ORDER — SODIUM CHLORIDE 0.9 % IV SOLN: INTRAVENOUS | Status: DC

## 2020-02-25 MED ORDER — ACETAMINOPHEN 160 MG/5ML PO SOLN: 650.0000 mg | ORAL | Status: DC | PRN

## 2020-02-25 MED ORDER — SENNOSIDES-DOCUSATE SODIUM 8.6-50 MG PO TABS
1.0000 | ORAL_TABLET | Freq: Every evening | ORAL | Status: DC | PRN
  Filled 2020-02-25: qty 1

## 2020-02-25 MED ORDER — STROKE: EARLY STAGES OF RECOVERY BOOK
Freq: Once | Status: AC
  Filled 2020-02-25: qty 1

## 2020-02-25 MED ORDER — ACETAMINOPHEN 650 MG RE SUPP: 650.0000 mg | RECTAL | Status: DC | PRN

## 2020-02-25 MED ORDER — ACETAMINOPHEN 325 MG PO TABS: 650.0000 mg | ORAL_TABLET | ORAL | Status: DC | PRN

## 2020-02-25 MED ORDER — ENOXAPARIN SODIUM 80 MG/0.8ML ~~LOC~~ SOLN
70.0000 mg | SUBCUTANEOUS | Status: DC
  Administered 2020-02-25: 70 mg via SUBCUTANEOUS
  Filled 2020-02-25: qty 0.8

## 2020-02-25 NOTE — ED Notes (Signed)
Pt resting   NAD  Neuro intact save for vision change to OS Alert, conversant

## 2020-02-25 NOTE — H&P (Deleted)
[facility] History & Physical  Vinson Moselle MD  NNAEMEKA SAMSON 02/25/2020  Chief Complaint: Dr Charm Barges HPI: The patient is a 54 y.o. year-old with hx of MI/ CAD w/ hx stent , + tobacco use, COPD sent to ED from opthomalogist Dr Cammie Mcgee office for new VF deficit. Pt has been experiencing L eye lower visual field deficits for around 2 wks.  Pt seen by Dr Alden Hipp and sent to ED to be admitted for possible stroke/ CVA as cause of his VF deficit.  Asked to see for admission.   In ED CT showed no acute changes.   Pt seen in room, states about 2 wks ago he has noticed left eye visual defect in the lower quadrants, RLQ worse than LLQ.  Upper fields are unchanged w/ the left eye. The right eye is normal per the patient.  No arm or leg weakness, no eye or lip drooping or confusion.  No hx CVA.    Pt has known CAD , had nstemi in 2018 w/ 2 stents to RCA.  Had another Nstemi in April 2021 , LHC showed 1 of 2 stents was 100% occluded and there was another RCA lesion of 90% proximal to both stents. LCx disease had ^'d to 50-60% at the worse. No sig LAD disease. No PCI was done , plan was for medical Rx. Pt f/b Dr Antoine Poche in [place].   Pt has COPD, smokes 1/2 ppd , pre MI was smoking 4 ppd.  Not on home O2.  No hx intubation.   Pt is divorced, lives w/ a friend.  Worked as Games developer until MI in 2018, refused for disability, has Medicaid now.      ROS  denies CP  no joint pain   no HA  no blurry vision  no rash  no diarrhea  no nausea/ vomiting  no dysuria  no difficulty voiding  no change in urine color    Past Medical History  Past Medical History:  Diagnosis Date  . Allergy   . Arthritis    neck  . Cataract    bilateral - MD monitoring cataracts  . CHF (congestive heart failure) (HCC)   . Chronic kidney disease, stage I    DR OTTELIN  HX UTIS  . Cirrhosis (HCC)   . Cramp of limb   . Diabetes mellitus   . Dysphagia, unspecified(787.20)   . Dysuria   . Epistaxis   .  GERD (gastroesophageal reflux disease)   . Heart murmur    NO CARDIOLOGIST  DX FOR YEARS ASYMPTOMATIC  . Lumbago   . Neoplasm of uncertain behavior of skin   . Nonspecific elevation of levels of transaminase or lactic acid dehydrogenase (LDH)   . Osteoarthrosis, unspecified whether generalized or localized, unspecified site   . Other and unspecified hyperlipidemia    diet controlled  . Pain in joint, shoulder region   . Paresthesias 09/10/2015  . Postablative ovarian failure   . Trochanteric bursitis of left hip 05/25/2016  . Type 2 diabetes mellitus without complication (HCC)   . Unspecified essential hypertension    no meds   Past Surgical History  Past Surgical History:  Procedure Laterality Date  . BREAST BIOPSY    . CARDIAC CATHETERIZATION N/A 07/07/2016   Procedure: Left Heart Cath and Coronary Angiography;  Surgeon: Lyn Records, MD;  Location: [facility] INVASIVE CV LAB;  Service: Cardiovascular;  Laterality: N/A;  . COLONOSCOPY  2012   Dr Virginia Rochester.   Marland Kitchen  COLONOSCOPY WITH PROPOFOL N/A 12/16/2016   Procedure: COLONOSCOPY WITH PROPOFOL;  Surgeon: Rachael Fee, MD;  Location: [facility] ENDOSCOPY;  Service: Endoscopy;  Laterality: N/A;  . CORONARY ARTERY BYPASS GRAFT N/A 07/08/2016   Procedure: CORONARY ARTERY BYPASS GRAFTING (CABG) x 3 USING RIGHT LEG GREATER SAPHENOUS VEIN GRAFT;  Surgeon: Loreli Slot, MD;  Location: [facility] OR;  Service: Open Heart Surgery;  Laterality: N/A;  . ENDOVEIN HARVEST OF GREATER SAPHENOUS VEIN Right 07/08/2016   Procedure: ENDOVEIN HARVEST OF GREATER SAPHENOUS VEIN;  Surgeon: Loreli Slot, MD;  Location: [facility] OR;  Service: Open Heart Surgery;  Laterality: Right;  . ESOPHAGEAL BANDING  09/06/2019   Procedure: ESOPHAGEAL BANDING;  Surgeon: Rachael Fee, MD;  Location: [facility] ENDOSCOPY;  Service: Endoscopy;;  . ESOPHAGEAL BANDING  09/16/2019   Procedure: ESOPHAGEAL BANDING;  Surgeon: Charna Elizabeth, MD;  Location: [facility] ENDOSCOPY;  Service: Endoscopy;;  .  ESOPHAGOGASTRODUODENOSCOPY N/A 09/16/2019   Procedure: ESOPHAGOGASTRODUODENOSCOPY (EGD);  Surgeon: Charna Elizabeth, MD;  Location: [facility] ENDOSCOPY;  Service: Endoscopy;  Laterality: N/A;  . ESOPHAGOGASTRODUODENOSCOPY (EGD) WITH PROPOFOL N/A 12/16/2016   Procedure: ESOPHAGOGASTRODUODENOSCOPY (EGD) WITH PROPOFOL;  Surgeon: Rachael Fee, MD;  Location: [facility] ENDOSCOPY;  Service: Endoscopy;  Laterality: N/A;  . ESOPHAGOGASTRODUODENOSCOPY (EGD) WITH PROPOFOL N/A 09/06/2019   Procedure: ESOPHAGOGASTRODUODENOSCOPY (EGD) WITH PROPOFOL;  Surgeon: Rachael Fee, MD;  Location: [facility] ENDOSCOPY;  Service: Endoscopy;  Laterality: N/A;  . HEMOSTASIS CLIP PLACEMENT  09/16/2019   Procedure: HEMOSTASIS CLIP PLACEMENT;  Surgeon: Charna Elizabeth, MD;  Location: [facility] ENDOSCOPY;  Service: Endoscopy;;  . IR ANGIOGRAM SELECTIVE EACH ADDITIONAL VESSEL  09/17/2019  . IR EMBO ART  VEN HEMORR LYMPH EXTRAV  INC GUIDE ROADMAPPING  09/17/2019  . IR PARACENTESIS  09/17/2019  . IR TIPS  09/17/2019  . MAXIMUM ACCESS (MAS)POSTERIOR LUMBAR INTERBODY FUSION (PLIF) 1 LEVEL Left 11/19/2015   Procedure: FOR MAXIMUM ACCESS (MAS) POSTERIOR LUMBAR INTERBODY FUSION (PLIF) LUMBAR THREE-FOUR EXTRAFORAMINAL MICRODISCECTOMY LUMBAR FIVE-SACRAL ONE LEFT;  Surgeon: Tia Alert, MD;  Location: [facility] NEURO ORS;  Service: Neurosurgery;  Laterality: Left;  . RADIOLOGY WITH ANESTHESIA N/A 09/17/2019   Procedure: RADIOLOGY WITH ANESTHESIA;  Surgeon: Radiologist, Medication, MD;  Location: [facility] OR;  Service: Radiology;  Laterality: N/A;  . SCLEROTHERAPY  09/16/2019   Procedure: SCLEROTHERAPY;  Surgeon: Charna Elizabeth, MD;  Location: [facility] ENDOSCOPY;  Service: Endoscopy;;  . TEE WITHOUT CARDIOVERSION N/A 07/08/2016   Procedure: TRANSESOPHAGEAL ECHOCARDIOGRAM (TEE);  Surgeon: Loreli Slot, MD;  Location: [facility] OR;  Service: Open Heart Surgery;  Laterality: N/A;  . TUBAL LIGATION  1982   Dr Roberto Scales  . UPPER GASTROINTESTINAL ENDOSCOPY    . VAGINAL HYSTERECTOMY  1997   Dr  Vivien Rota   Family History  Family History  Problem Relation Age of Onset  . Lung cancer Father   . Arthritis Sister   . Arthritis Brother   . Heart disease Maternal Grandmother   . Heart disease Maternal Grandfather   . Heart disease Paternal Grandmother   . Heart disease Paternal Grandfather   . Breast cancer Mother   . Liver cancer Brother   . Breast cancer Maternal Aunt   . Breast cancer Paternal Aunt   . Colon cancer Neg Hx   . Esophageal cancer Neg Hx   . Rectal cancer Neg Hx   . Stomach cancer Neg Hx    Social History  reports that she has never smoked. She has never used smokeless tobacco. She reports that she does not drink alcohol or  use drugs. Allergies  Allergies  Allergen Reactions  . Kiwi Extract Anaphylaxis  . Tdap [Tetanus-Diphth-Acell Pertussis] Swelling and Other (See Comments)    Swelling at injection site, gets very hot  . Statins     RHABDOMYOLYSIS  . Latex Itching, Dermatitis and Rash  . Tramadol Nausea And Vomiting   Home medications Prior to Admission medications   Medication Sig Start Date End Date Taking? Authorizing Provider  acetaminophen (TYLENOL) 500 MG tablet Take 500 mg by mouth at bedtime.     [provider]  Aromatic Inhalants (VICKS VAPOR IN) Vicks Vapor Rub apply small amount to outside of nose to help breathing    [provider]  BD PEN NEEDLE NANO U/F 32G X 4 MM MISC USE THREE TIMES DAILY AS DIRECTED 05/07/19   Reed, Tiffany L, DO  Biotin 10000 MCG TABS Take 10,000 mcg by mouth every morning.    [provider]  bisacodyl (DULCOLAX) 10 MG suppository Place 1 suppository (10 mg total) rectally daily as needed for moderate constipation. 09/21/19   Aline August, MD  calcium carbonate (OS-CAL) 600 MG TABS Take 600 mg by mouth 2 (two) times daily with a meal.      [provider]  Cholecalciferol (VITAMIN D) 50 MCG (2000 UT) CAPS Take 2,000 Units by mouth daily.     [provider]  Cyanocobalamin  (VITAMIN B 12 PO) Take 1,000 mcg by mouth daily.      [provider]  ezetimibe (ZETIA) 10 MG tablet TAKE 1 TABLET(10 MG) BY MOUTH DAILY 06/06/19   Reed, Tiffany L, DO  furosemide (LASIX) 40 MG tablet Take 40 mg by mouth daily.     [provider]  glucose blood test strip One Touch Ultra II strips. Use to test blood sugar three times daily. Dx: E11.65 11/10/17   Reed, Tiffany L, DO  insulin detemir (LEVEMIR) 100 UNIT/ML injection Inject 0.2 mLs (20 Units total) into the skin at bedtime. 09/22/19   Aline August, MD  Insulin Syringe-Needle U-100 (INSULIN SYRINGE 1CC/31GX5/16") 31G X 5/16" 1 ML MISC USE AS DIRECTED DAILY WITH LEVEMIR 01/05/19   Reed, Tiffany L, DO  JARDIANCE 25 MG TABS tablet Take 25 mg by mouth daily. 10/09/19   [provider]  lactulose (CHRONULAC) 10 GM/15ML solution Take 20 g by mouth 3 (three) times daily. 10/13/19   [provider]  loratadine (CLARITIN) 10 MG tablet Take 10 mg by mouth daily as needed for allergies.    [provider]  MAGNESIUM PO Take 500 mg by mouth 2 (two) times daily in the am and at bedtime..    [provider]  Multiple Vitamins-Minerals (MULTIVITAMIN WITH MINERALS) tablet Take 1 tablet by mouth daily.      [provider]  NOVOLOG FLEXPEN 100 UNIT/ML FlexPen Inject 12 units under the skin every morning, 8 units at lunch and 12 units at supper 07/31/19   Reed, Tiffany L, DO  ondansetron (ZOFRAN) 4 MG tablet Take 1 tablet (4 mg total) by mouth every 6 (six) hours as needed for nausea. 09/21/19   Aline August, MD  pantoprazole (PROTONIX) 40 MG tablet Take 1 tablet (40 mg total) by mouth 2 (two) times daily. 09/21/19   Aline August, MD  Polyethyl Glycol-Propyl Glycol (SYSTANE OP) Place 1 drop into both eyes 2 (two) times daily.    [provider]  Probiotic Product (PROBIOTIC DAILY PO) Take 1 capsule by mouth daily. Digestive Advantage Probiotic    [provider]   spironolactone (ALDACTONE) 50 MG tablet Take 1 tablet (50 mg total) by mouth 2 (two) times daily. 09/24/19   Kermit Balo, DO   Liver Function Tests Recent Labs  Lab 10/22/19 1436 10/24/19 1042  AST 58* 62*  ALT 41* 45*  ALKPHOS  --  127*  BILITOT 3.5* 3.4*  PROT 7.8 7.2  ALBUMIN  --  3.0*   Recent Labs  Lab 10/24/19 1042  LIPASE 29   CBC Recent Labs  Lab 10/22/19 1436 10/24/19 1042 10/24/19 1056  WBC 10.4 7.7  --   NEUTROABS 8,299* 5.9  --   HGB 16.1* 14.9 15.0  HCT 47.2* 43.4 44.0  MCV 92.5 94.6  --   PLT 151 116*  --    Basic Metabolic Panel Recent Labs  Lab 10/22/19 1436 10/24/19 1042 10/24/19 1056  NA 133* 131* 133*  K 4.5 4.6 4.6  CL 96* 97*  --   CO2 24 23  --   GLUCOSE 269* 138*  --   BUN 19 20  --   CREATININE 1.05* 1.13*  --   CALCIUM 11.1* 10.0  --    Iron/TIBC/Ferritin/ %Sat    Component Value Date/Time   IRON 29 10/16/2016 1422   TIBC 427 10/16/2016 1422   FERRITIN 13 10/16/2016 1422   IRONPCTSAT 7 (L) 10/16/2016 1422    Vitals:   10/24/19 1018 10/24/19 1030 10/24/19 1045 10/24/19 1215  BP: (!) 123/51 (!) 101/46  (!) 125/54  Pulse: 89 83    Resp: 16 15  20   Temp: 97.8 F (36.6 C)  97.9 F (36.6 C)   TempSrc: Oral  Rectal   SpO2: 99% 98%      Exam Gen alert, no distress, obese, pleasant No rash, cyanosis or gangrene Sclera anicteric, throat clear  No jvd or bruits Chest clear bilat to bases  RRR no MRG Abd soft ntnd no mass or ascites +bs GU normal male MS no joint effusions or deformity Ext no LE or UE edema, no wounds or ulcers Neuro is alert, Ox 3 , nonfocal UE/ LE's are 5/5 x 4  no facial droop    Home meds:  - asa 81mg / lipitor 80/ imdur 120 qd/ sl ntg prn/ nicotine patch  - spiriva respimat 2 puffs qd/ albuterol HFA qid prn/ stiolto respimat 2 qd  - gabapentin 100 tid     Assessment/ Plan: 1. Visual field deficit - going on for 2 wks, L eye lower field deficits. Admit, rule out CVA. R eye not affected.  Seen by optho today in office, recommended admit for r/o embolic CVA. No other gross neuro defect. No afib here. Plan CT / MRI/ MRA of head, carotid dopplers, echo.  DVT proph lovenox for now. Is already on ASA, will continue for now. Non-emergent neuro consult.  2. CAD - sp stents to RCA in 2018. Recent CP/ +stress test and repeat LHC showed worsening RCA/ LCx disease, no intervention done, medical Rx.   3. COPD - not severe, smoker down to 1/2 ppd now 4. HL - cont meds      , MD   [facility] 10/24/2019, 1:10 PM

## 2020-02-25 NOTE — ED Notes (Signed)
Pt currently eating a salad and speaking on cell phone

## 2020-02-25 NOTE — ED Provider Notes (Signed)
[facility] EMERGENCY DEPARTMENT Provider Note   CSN: [id number] Arrival date & time: 02/25/20  1302     History Chief Complaint  Patient presents with  . Eye Problem    Chad Gillespie is a 54 y.o. male.  He is here with complaint of 2 weeks of loss of vision in his left lower visual field.  Not associate with any pain.  There were no other neurologic signs or symptoms.  He saw Dr. Alden Hipp ophthalmology today and was sent here for further evaluation.  It sounds like they noticed some embolic infarct and wanted him here for further stroke work-up.  He has cardiac disease takes nitro sometimes twice a day.  On an aspirin daily.  The history is provided by the patient.  Eye Problem Location:  Left eye Quality: field cut. Severity:  Severe Onset quality:  Sudden Duration:  2 weeks Timing:  Constant Progression:  Unchanged Chronicity:  New Relieved by:  Nothing Worsened by:  Nothing Ineffective treatments:  None tried Associated symptoms: decreased vision   Associated symptoms: no double vision, no facial rash, no numbness, no photophobia, no vomiting and no weakness        Past Medical History:  Diagnosis Date  . CAD (coronary artery disease)    a. s/p NSTEMI in 01/2017 with DESx2 to RCA  . MI (myocardial infarction) (HCC)   . Tobacco abuse     Patient Active Problem List   Diagnosis Date Noted  . Educated about COVID-19 virus infection 01/27/2020  . Accelerating angina (HCC) 01/18/2020  . Abnormal stress test 01/18/2020  . OSA (obstructive sleep apnea) 12/26/2019  . Leg swelling 06/14/2017  . Hyperlipidemia 06/14/2017  . Essential hypertension 06/14/2017  . Coronary artery disease involving native coronary artery of native heart without angina pectoris 06/14/2017  . Medication management 06/14/2017  . Abnormal PFT 06/14/2017  . Prediabetes 02/10/2017  . NSTEMI (non-ST elevated myocardial infarction) (HCC) 02/03/2017  . Chest pain 02/03/2017  . Tobacco abuse  02/03/2017  . Obesity 02/03/2017  . Leukocytosis 02/03/2017  . Hypoxia 02/03/2017    Past Surgical History:  Procedure Laterality Date  . CORONARY STENT INTERVENTION N/A 02/04/2017   Procedure: Coronary Stent Intervention;  Surgeon: Peter M Swaziland, MD;  Location: [facility] INVASIVE CV LAB;  Service: Cardiovascular;  Laterality: N/A;  . ESOPHAGOGASTRODUODENOSCOPY (EGD) WITH ESOPHAGEAL DILATION    . LEFT HEART CATH AND CORONARY ANGIOGRAPHY N/A 02/04/2017   Procedure: Left Heart Cath and Coronary Angiography;  Surgeon: Peter M Swaziland, MD;  Location: [facility] INVASIVE CV LAB;  Service: Cardiovascular;  Laterality: N/A;  . LEFT HEART CATH AND CORONARY ANGIOGRAPHY N/A 01/18/2020   Procedure: LEFT HEART CATH AND CORONARY ANGIOGRAPHY;  Surgeon: Yvonne Kendall, MD;  Location: [facility] INVASIVE CV LAB;  Service: Cardiovascular;  Laterality: N/A;       Family History  Problem Relation Age of Onset  . Hypertension Mother   . Cancer Father   . Heart attack Father   . Diabetes Father   . Diabetes Sister   . Cancer Sister     Social History   Tobacco Use  . Smoking status: Current Every Day Smoker    Packs/day: 0.10    Types: Cigarettes    Start date: 04/11/1986  . Smokeless tobacco: Never Used  Substance Use Topics  . Alcohol use: Yes    Comment: occasionally   . Drug use: No    Home Medications Prior to Admission medications   Medication Sig Start Date End Date  Taking? Authorizing Provider  albuterol (VENTOLIN HFA) 108 (90 Base) MCG/ACT inhaler INHALE 2 PUFFS INTO THE LUNGS EVERY 6 HOURS AS NEEDED FOR WHEEZING OR SHORTNESS OF BREATH Patient taking differently: Inhale 2 puffs into the lungs every 6 (six) hours as needed for wheezing or shortness of breath.  12/14/19   Charlott Rakes, MD  aspirin 81 MG chewable tablet Chew 1 tablet (81 mg total) by mouth daily. 11/29/19   Strader, Fransisco Hertz, PA-C  atorvastatin (LIPITOR) 80 MG tablet Take 1 tablet (80 mg total) by mouth daily at 6 PM. 11/29/19   Strader,  [place] M, PA-C  gabapentin (NEURONTIN) 100 MG capsule TAKE 1 CAPSULE(100 MG) BY MOUTH THREE TIMES DAILY Patient taking differently: Take 100 mg by mouth 3 (three) times daily.  12/25/19   [name], NP  isosorbide mononitrate (IMDUR) 120 MG 24 hr tablet Take 1 tablet (120 mg total) by mouth daily. 01/28/20 01/27/21  [name], MD  nicotine (NICODERM CQ) 14 mg/24hr patch Place 1 patch (14 mg total) onto the skin daily. 11/14/19   [name], NP  nicotine (NICODERM CQ) 21 mg/24hr patch Place 1 patch (21 mg total) onto the skin daily. Patient not taking: Reported on 02/19/2020 11/14/19   [name], NP  nicotine (NICODERM CQ) 7 mg/24hr patch Place 1 patch (7 mg total) onto the skin daily. Patient not taking: Reported on 02/19/2020 11/14/19   [name], NP  nitroGLYCERIN (NITROSTAT) 0.4 MG SL tablet Place 1 tablet (0.4 mg total) under the tongue every 5 (five) minutes as needed for chest pain. 11/24/19   [name], NP  Tiotropium Bromide Monohydrate (SPIRIVA RESPIMAT) 2.5 MCG/ACT AERS Inhale 2 puffs into the lungs daily. 12/26/19   Rigoberto Noel, MD  Tiotropium Bromide-Olodaterol (STIOLTO RESPIMAT) 2.5-2.5 MCG/ACT AERS Inhale 2 puffs into the lungs daily. 01/15/20   Rigoberto Noel, MD    Allergies    Penicillins  Review of Systems   Review of Systems  Constitutional: Negative for fever.  HENT: Negative for sore throat.   Eyes: Positive for visual disturbance. Negative for double vision and photophobia.  Respiratory: Negative for shortness of breath.   Cardiovascular: Negative for chest pain.  Gastrointestinal: Negative for abdominal pain and vomiting.  Genitourinary: Negative for dysuria.  Musculoskeletal: Negative for neck pain.  Skin: Negative for rash.  Neurological: Negative for weakness and numbness.    Physical Exam Updated Vital Signs BP 108/66   Pulse 74   Temp 98.5 F (36.9 C) (Oral)   Resp 19   Ht 6' (1.829 m)   Wt (!) 148 kg    SpO2 94%   BMI 44.25 kg/m   Physical Exam Vitals and nursing note reviewed.  Constitutional:      Appearance: He is well-developed.  HENT:     Head: Normocephalic and atraumatic.  Eyes:     General:        Right eye: No discharge.        Left eye: No discharge.     Extraocular Movements: Extraocular movements intact.     Conjunctiva/sclera: Conjunctivae normal.  Cardiovascular:     Rate and Rhythm: Normal rate and regular rhythm.     Heart sounds: No murmur.  Pulmonary:     Effort: Pulmonary effort is normal. No respiratory distress.     Breath sounds: Normal breath sounds.  Abdominal:     Palpations: Abdomen is soft.     Tenderness: There is no abdominal tenderness.  Musculoskeletal:        General: No deformity or signs of injury. Normal range of motion.     Cervical back: Neck supple.  Skin:    General: Skin is warm and dry.     Capillary Refill: Capillary refill takes less than 2 seconds.  Neurological:     General: No focal deficit present.     Mental Status: He is alert.     Cranial Nerves: Cranial nerve deficit present.     Sensory: No sensory deficit.     Motor: No weakness.     Comments: He has a field cut left eye lower field.     ED Results / Procedures / Treatments   Labs (all labs ordered are listed, but only abnormal results are displayed) Labs Reviewed  APTT - Abnormal; Notable for the following components:      Result Value   aPTT 42 (*)    All other components within normal limits  LIPID PANEL - Abnormal; Notable for the following components:   HDL 24 (*)    All other components within normal limits  SARS CORONAVIRUS 2 BY RT PCR (HOSPITAL ORDER, PERFORMED IN [facility] [place] [facility])  COMPREHENSIVE METABOLIC PANEL  CBC  PROTIME-INR  RAPID URINE DRUG SCREEN, HOSP PERFORMED  HIV ANTIBODY (ROUTINE TESTING W REFLEX)    EKG EKG Interpretation  Date/Time:  Monday Feb 25 2020 14:06:03 EDT Ventricular Rate:  77 PR Interval:    QRS Duration:  98 QT Interval:  366 QTC Calculation: 415 R Axis:   45 Text Interpretation: Sinus rhythm Premature ventricular complexes Baseline wander No previous tracing Confirmed by Cathren Laine ([id number]) on 02/25/2020 2:15:47 PM   Radiology CT HEAD WO CONTRAST  Result Date: 02/25/2020 CLINICAL DATA:  Neuro deficit. Loss of vision in LEFT eye for 2 weeks. EXAM: CT HEAD WITHOUT CONTRAST TECHNIQUE: Contiguous axial images were obtained from the base of the skull through the vertex without intravenous contrast. COMPARISON:  05/16/2013 FINDINGS: Brain: No evidence of acute infarction, hemorrhage, hydrocephalus, extra-axial collection or mass lesion/mass effect. Vascular: No hyperdense vessel or unexpected calcification. Skull: Normal. Negative for fracture or focal lesion. Sinuses/Orbits: Visualized paranasal sinuses and orbits are unremarkable. Other: None. IMPRESSION: No acute intracranial abnormality. Electronically Signed   By: Donzetta Kohut M.D.   On: 02/25/2020 15:05    Procedures Procedures (including critical care time)  Medications Ordered in ED Medications - No data to display  ED Course  I have reviewed the triage vital signs and the nursing notes.  Pertinent labs & imaging results that were available during my care of the patient were reviewed by me and considered in my medical decision making (see chart for details).  Clinical Course as of Feb 25 1556  Mon Feb 25, 2020  1504 Patient came with a note from Dr. Dione Booze dated today.  And it he says that there is an embolus at the superior edge of the optic nerve on the left side.  He needs an urgent stroke evaluation.  I would suggest echocardiogram carotid Doppler studies MRI and blood work.   [MB]  1553 Gust with [facility] hospitalist Dr. Arlean Hopping who accepts the patient for admission.   [MB]    Clinical Course User Index [MB] Terrilee Files, MD   MDM Rules/Calculators/A&P                      This patient complains of vision loss; this  involves an extensive number of treatment  Options and is a complaint that carries with it a high risk of complications and Morbidity. The differential includes occipital stroke, retinal detachment, retinal infarct, cataract.  I ordered, reviewed and interpreted labs, which included CBC and chemistries including LFTs which were all normal.  He had an EKG showing him to be sinus rhythm. I ordered imaging studies which included CT head noncontrast and I independently     visualized and interpreted imaging which showed no evidence of stroke or mass. Previous records obtained and reviewed from Dr. Dione Booze I consulted [facility] hospitalist Dr. Arlean Hopping and discussed lab and imaging findings  Critical Interventions: None  After the interventions stated above, I reevaluated the patient and found patient's exam to be stable.  He will need to be admitted for stroke work-up.  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

## 2020-02-25 NOTE — ED Triage Notes (Addendum)
Pt reports lost vision in left lower half of left eye x2 weeks ago. Pt denies pain or injury.pt sent over from eye doctor for evaluation and reports "embolus at the superior edge of the optic nerve."

## 2020-02-25 NOTE — ED Notes (Signed)
Bed ready  Called for report  RN not available

## 2020-02-25 NOTE — ED Notes (Signed)
Report to Emily, RN

## 2020-02-26 ENCOUNTER — Inpatient Hospital Stay (HOSPITAL_COMMUNITY): Payer: Medicaid Other

## 2020-02-26 DIAGNOSIS — I6389 Other cerebral infarction: Secondary | ICD-10-CM

## 2020-02-26 DIAGNOSIS — I2510 Atherosclerotic heart disease of native coronary artery without angina pectoris: Secondary | ICD-10-CM | POA: Diagnosis not present

## 2020-02-26 LAB — LIPID PANEL
Cholesterol: 126 mg/dL (ref 0–200)
HDL: 24 mg/dL — ABNORMAL LOW (ref >40)
LDL Cholesterol: 82 mg/dL (ref 0–99)
Total CHOL/HDL Ratio: 5.3 RATIO
Triglycerides: 101 mg/dL (ref <150)
VLDL: 20 mg/dL (ref 0–40)

## 2020-02-26 LAB — ECHOCARDIOGRAM COMPLETE
Height: 72 in
Weight: 5322.79 oz

## 2020-02-26 MED ORDER — ISOSORBIDE MONONITRATE ER 60 MG PO TB24
120.0000 mg | ORAL_TABLET | Freq: Every day | ORAL | Status: DC
  Administered 2020-02-26: 120 mg via ORAL
  Filled 2020-02-26 (×2): qty 2

## 2020-02-26 MED ORDER — NICOTINE 21 MG/24HR TD PT24
21.0000 mg | MEDICATED_PATCH | Freq: Every day | TRANSDERMAL | Status: DC
  Administered 2020-02-26: 21 mg via TRANSDERMAL
  Filled 2020-02-26: qty 1

## 2020-02-26 MED ORDER — GABAPENTIN 100 MG PO CAPS
100.0000 mg | ORAL_CAPSULE | Freq: Three times a day (TID) | ORAL | Status: DC
  Administered 2020-02-26: 100 mg via ORAL
  Filled 2020-02-26: qty 1

## 2020-02-26 MED ORDER — ISOSORBIDE MONONITRATE ER 60 MG PO TB24: 120.0000 mg | ORAL_TABLET | Freq: Every day | ORAL | Status: DC

## 2020-02-26 MED ORDER — ASPIRIN 81 MG PO CHEW
81.0000 mg | CHEWABLE_TABLET | Freq: Every day | ORAL | Status: DC
  Administered 2020-02-26: 81 mg via ORAL
  Filled 2020-02-26: qty 1

## 2020-02-26 MED ORDER — ALBUTEROL SULFATE (2.5 MG/3ML) 0.083% IN NEBU: 2.5000 mg | INHALATION_SOLUTION | Freq: Four times a day (QID) | RESPIRATORY_TRACT | Status: DC | PRN

## 2020-02-26 MED ORDER — PERFLUTREN LIPID MICROSPHERE
1.0000 mL | INTRAVENOUS | Status: AC | PRN
  Administered 2020-02-26: 1 mL via INTRAVENOUS
  Filled 2020-02-26: qty 10

## 2020-02-26 MED ORDER — ATORVASTATIN CALCIUM 40 MG PO TABS: 80.0000 mg | ORAL_TABLET | Freq: Every day | ORAL | Status: DC

## 2020-02-26 MED ORDER — CLOPIDOGREL BISULFATE 75 MG PO TABS
75.0000 mg | ORAL_TABLET | Freq: Every day | ORAL | 0 refills | Status: AC
Start: 2020-02-26 — End: 2020-03-11

## 2020-02-26 MED ORDER — UMECLIDINIUM BROMIDE 62.5 MCG/INH IN AEPB
1.0000 | INHALATION_SPRAY | Freq: Every day | RESPIRATORY_TRACT | Status: DC
  Administered 2020-02-26: 1 via RESPIRATORY_TRACT
  Filled 2020-02-26: qty 7

## 2020-02-26 MED ORDER — ALBUTEROL SULFATE HFA 108 (90 BASE) MCG/ACT IN AERS: 2.0000 | INHALATION_SPRAY | Freq: Four times a day (QID) | RESPIRATORY_TRACT | Status: DC | PRN

## 2020-02-26 MED ORDER — TIOTROPIUM BROMIDE MONOHYDRATE 2.5 MCG/ACT IN AERS: 2.0000 | INHALATION_SPRAY | Freq: Every day | RESPIRATORY_TRACT | Status: DC

## 2020-02-26 MED ORDER — NITROGLYCERIN 0.4 MG SL SUBL: 0.4000 mg | SUBLINGUAL_TABLET | SUBLINGUAL | Status: DC | PRN

## 2020-02-26 NOTE — Discharge Summary (Signed)
Physician Discharge Summary  Chad Gillespie:[id number] DOB: 07-May-1966 DOA: 02/25/2020  PCP: [name], NP  Admit date: 02/25/2020  Discharge date: 02/26/2020  Admitted From:Home  Disposition:  Home  Recommendations for Outpatient Follow-up:  1. Follow up with PCP in 1-2 weeks 2. Follow-up with Dr. Gerilyn Pilgrim with neurology in 2-4 weeks 3. Remain on Plavix for total 14 days along with aspirin, then aspirin only thereafter 4. Continue home statin 5. Counseled on not driving until seen by neurology/ophthalmology  Home Health: None  Equipment/Devices: None  Discharge Condition:  CODE STATUS: Full  Diet recommendation: Heart Healthy  Brief/Interim Summary: Per HPI: The patient is a 54 y.o. year-old with hx of MI/ CAD w/ hx stent , + tobacco use, COPD sent to ED from opthomalogist Dr Cammie Mcgee office for new VF deficit. Pt has been experiencing L eye lower visual field deficits for around 2 wks.  Pt seen by Dr Alden Hipp and sent to ED to be admitted for possible stroke/ CVA as cause of his VF deficit.  Asked to see for admission.   In ED CT showed no acute changes.   Pt seen in room, states about 2 wks ago he has noticed left eye visual defect in the lower quadrants, RLQ worse than LLQ.  Upper fields are unchanged w/ the left eye. The right eye is normal per the patient.  No arm or leg weakness, no eye or lip drooping or confusion.  No hx CVA.    Pt has known CAD , had nstemi in 2018 w/ 2 stents to RCA.  Had another Nstemi in April 2021 , LHC showed 1 of 2 stents was 100% occluded and there was another RCA lesion of 90% proximal to both stents. LCx disease had ^'d to 50-60% at the worse. No sig LAD disease. No PCI was done , plan was for medical Rx. Pt f/b Dr Antoine Poche in [place].   Pt has COPD, smokes 1/2 ppd , pre MI was smoking 4 ppd.  Not on home O2.  No hx intubation.   Pt is divorced, lives w/ a friend.  Worked as Games developer until MI in 2018, refused for disability,  has Medicaid now.    -Patient was admitted with left-sided visual field deficit that had been ongoing for 2 weeks.  He was admitted for rule out of CVA and brain MRI has returned negative.  His 2D echocardiogram demonstrates LVEF 55% and there are no findings of thrombus.  His carotid ultrasounds did not demonstrate any significant stenosis either.  He states that he has repeat appointment with ophthalmology in 2 weeks which I have encouraged him to follow-up with.  Additionally, I have recommended dual antiplatelet therapy for 2 weeks and then follow-up with neurology in the outpatient setting.  He is eager for discharge and has seen PT and OT with no other recommendations at this time.  He is stable with no acute events noted in the hospital setting.  Discharge Diagnoses:  Principal Problem:   Coronary artery disease involving native coronary artery of native heart without angina pectoris Active Problems:   Tobacco abuse   Obesity   Hyperlipidemia   OSA (obstructive sleep apnea)   Visual disturbance of one eye   Cerebrovascular accident [facility])   Visual loss  Principal discharge diagnosis: Left visual field deficit with no evidence of CVA.  Discharge Instructions  Discharge Instructions    Diet - low sodium heart healthy   Complete by: As directed  Increase activity slowly   Complete by: As directed      Allergies as of 02/26/2020      Reactions   Penicillins Hives      Medication List    TAKE these medications   albuterol 108 (90 Base) MCG/ACT inhaler Commonly known as: VENTOLIN HFA INHALE 2 PUFFS INTO THE LUNGS EVERY 6 HOURS AS NEEDED FOR WHEEZING OR SHORTNESS OF BREATH What changed: See the new instructions.   aspirin 81 MG chewable tablet Chew 1 tablet (81 mg total) by mouth daily.   atorvastatin 80 MG tablet Commonly known as: LIPITOR Take 1 tablet (80 mg total) by mouth daily at 6 PM.   clopidogrel 75 MG tablet Commonly known as: Plavix Take 1 tablet (75 mg  total) by mouth daily for 14 days.   gabapentin 100 MG capsule Commonly known as: NEURONTIN TAKE 1 CAPSULE(100 MG) BY MOUTH THREE TIMES DAILY What changed: See the new instructions.   isosorbide mononitrate 120 MG 24 hr tablet Commonly known as: IMDUR Take 1 tablet (120 mg total) by mouth daily.   nicotine 21 mg/24hr patch Commonly known as: Nicoderm CQ Place 1 patch (21 mg total) onto the skin daily.   nicotine 14 mg/24hr patch Commonly known as: Nicoderm CQ Place 1 patch (14 mg total) onto the skin daily.   nitroGLYCERIN 0.4 MG SL tablet Commonly known as: NITROSTAT Place 1 tablet (0.4 mg total) under the tongue every 5 (five) minutes as needed for chest pain.   Spiriva Respimat 2.5 MCG/ACT Aers Generic drug: Tiotropium Bromide Monohydrate Inhale 2 puffs into the lungs daily.   Stiolto Respimat 2.5-2.5 MCG/ACT Aers Generic drug: Tiotropium Bromide-Olodaterol Inhale 2 puffs into the lungs daily.      Follow-up Information    [name], NP Follow up in 1 week(s).   Specialty: Internal Medicine Contact information: [address] [place] [address] [phone]        Beryle Beams, MD Follow up in 4 week(s).   Specialty: Neurology Contact information: [address] [place] [address] [phone]          Allergies  Allergen Reactions  . Penicillins Hives    Consultations:  None   Procedures/Studies: DG Chest 2 View  Result Date: 02/25/2020 CLINICAL DATA:  Visual difficulty EXAM: CHEST - 2 VIEW COMPARISON:  02/03/2017 FINDINGS: Cardiac shadows within normal limits. Very mild vascular congestion and edema is seen. No focal infiltrate or effusion is noted. No bony abnormality is seen. IMPRESSION: Mild vascular congestion and changes of CHF. Electronically Signed   By: Alcide Clever M.D.   On: 02/25/2020 18:20   CT HEAD WO CONTRAST  Result Date: 02/25/2020 CLINICAL DATA:  Neuro deficit. Loss of vision in LEFT eye for 2  weeks. EXAM: CT HEAD WITHOUT CONTRAST TECHNIQUE: Contiguous axial images were obtained from the base of the skull through the vertex without intravenous contrast. COMPARISON:  05/16/2013 FINDINGS: Brain: No evidence of acute infarction, hemorrhage, hydrocephalus, extra-axial collection or mass lesion/mass effect. Vascular: No hyperdense vessel or unexpected calcification. Skull: Normal. Negative for fracture or focal lesion. Sinuses/Orbits: Visualized paranasal sinuses and orbits are unremarkable. Other: None. IMPRESSION: No acute intracranial abnormality. Electronically Signed   By: Donzetta Kohut M.D.   On: 02/25/2020 15:05   MR BRAIN WO CONTRAST  Result Date: 02/26/2020 CLINICAL DATA:  Visual deficit rule out stroke.  Bilateral weakness. EXAM: MRI HEAD WITHOUT CONTRAST TECHNIQUE: Multiplanar, multiecho pulse sequences of the brain and surrounding structures were obtained without  intravenous contrast. COMPARISON:  CT head 02/25/2020 FINDINGS: Brain: No acute infarction, hemorrhage, hydrocephalus, extra-axial collection or mass lesion. Vascular: Normal arterial flow voids Skull and upper cervical spine: Negative Sinuses/Orbits: Mild mucosal edema paranasal sinuses.  Normal orbit. Other: None IMPRESSION: Negative MRI head. Electronically Signed   By: Marlan Palau M.D.   On: 02/26/2020 14:11   US Carotid Bilateral (at [facility] and [facility] only)  Result Date: 02/25/2020 CLINICAL DATA:  Left eye vision loss with known embolus on physical exam EXAM: BILATERAL CAROTID DUPLEX ULTRASOUND TECHNIQUE: Wallace Cullens scale imaging, color Doppler and duplex ultrasound were performed of bilateral carotid and vertebral arteries in the neck. COMPARISON:  None FINDINGS: Criteria: Quantification of carotid stenosis is based on velocity parameters that correlate the residual internal carotid diameter with NASCET-based stenosis levels, using the diameter of the distal internal carotid lumen as the denominator for stenosis measurement. The  following velocity measurements were obtained: RIGHT ICA: 84/23 cm/sec CCA: 97/14 cm/sec SYSTOLIC ICA/CCA RATIO:  0.9 ECA: 124 cm/sec LEFT ICA: 106/25 cm/sec CCA: 120/19 cm/sec SYSTOLIC ICA/CCA RATIO:  0.9 ECA: 68 cm/sec RIGHT CAROTID ARTERY: Preliminary grayscale images demonstrate no significant atherosclerotic plaque. The waveforms, velocities and flow velocity ratios show no evidence of focal hemodynamically significant stenosis. RIGHT VERTEBRAL ARTERY:  Antegrade in nature. LEFT CAROTID ARTERY: Preliminary grayscale images demonstrate no significant atherosclerotic plaque. The waveforms, velocities and flow velocity ratios demonstrate no evidence of focal hemodynamically significant stenosis. LEFT VERTEBRAL ARTERY:  Antegrade in nature. IMPRESSION: No evidence of focal hemodynamically significant stenosis. Electronically Signed   By: Alcide Clever M.D.   On: 02/25/2020 18:22   ECHOCARDIOGRAM COMPLETE  Result Date: 02/26/2020    ECHOCARDIOGRAM REPORT   Patient Name:   JAVIS JAGGARD Date of Exam: 02/26/2020 Medical Rec #:  [id number]       Height:       72.0 in Accession #:    [id number]      Weight:       332.7 lb Date of Birth:  12-Nov-1965       BSA:          2.645 m Patient Age:    53 years        BP:           124/66 mmHg Patient Gender: M               HR:           60 bpm. [facility] Procedure: 2D Echo and Intracardiac Opacification Agent Indications:    Stroke 434.91 / I163.9  History:        Patient has prior history of Echocardiogram examinations, most                 recent 02/04/2017. Previous Myocardial Infarction and CAD,                 Stroke, Signs/Symptoms:Chest Pain; Risk Factors:Current Smoker                 and Dyslipidemia.  Sonographer:    Jeryl Columbia RDCS (AE) Referring Phys: [id number] ROBERT SCHERTZ IMPRESSIONS  1. Left ventricular ejection fraction, by estimation, is 55%. The left ventricle has low normal function. The left ventricle demonstrates regional wall motion  abnormalities (see scoring diagram/findings for description). There is moderate concentric left ventricular hypertrophy. Left ventricular diastolic parameters are indeterminate.  2. Right ventricular systolic function is normal. The right ventricular size is mildly enlarged.  3. Right atrial  size was mildly dilated.  4. The mitral valve is grossly normal. No evidence of mitral valve regurgitation.  5. The aortic valve is tricuspid. Aortic valve regurgitation is not visualized. No aortic stenosis is present.  6. The inferior vena cava is normal in size with greater than 50% respiratory variability, suggesting right atrial pressure of 3 mmHg. FINDINGS  Left Ventricle: Left ventricular ejection fraction, by estimation, is 55%. The left ventricle has low normal function. The left ventricle demonstrates regional wall motion abnormalities. Definity contrast agent was given IV to delineate the left ventricular endocardial borders. The left ventricular internal cavity size was normal in size. There is moderate concentric left ventricular hypertrophy. Left ventricular diastolic parameters are indeterminate. Indeterminate filling pressures.  LV Wall Scoring: The basal inferolateral segment and basal inferior segment are hypokinetic. Right Ventricle: The right ventricular size is mildly enlarged. No increase in right ventricular wall thickness. Right ventricular systolic function is normal. Left Atrium: Left atrial size was normal in size. Right Atrium: Right atrial size was mildly dilated. Pericardium: There is no evidence of pericardial effusion. Mitral Valve: The mitral valve is grossly normal. No evidence of mitral valve regurgitation. Tricuspid Valve: The tricuspid valve is grossly normal. Tricuspid valve regurgitation is not demonstrated. Aortic Valve: The aortic valve is tricuspid. . There is mild thickening of the aortic valve. Aortic valve regurgitation is not visualized. No aortic stenosis is present. Mild aortic  valve annular calcification. There is mild thickening of the aortic valve. Pulmonic Valve: The pulmonic valve was grossly normal. Pulmonic valve regurgitation is not visualized. Aorta: The aortic root is normal in size and structure. Venous: The inferior vena cava is normal in size with greater than 50% respiratory variability, suggesting right atrial pressure of 3 mmHg. IAS/Shunts: No atrial level shunt detected by color flow Doppler. Kate Sable MD Electronically signed by Kate Sable MD Signature Date/Time: 02/26/2020/9:53:49 AM    Final      Discharge Exam: Vitals:   02/26/20 1028 02/26/20 1420  BP: 119/80 122/76  Pulse: 68 73  Resp: 15 (!) 21  Temp:  98 F (36.7 C)  SpO2: 95% 100%   Vitals:   02/26/20 0619 02/26/20 0830 02/26/20 1028 02/26/20 1420  BP: 124/66  119/80 122/76  Pulse: 60  68 73  Resp: 20  15 (!) 21  Temp: 97.7 F (36.5 C)   98 F (36.7 C)  TempSrc: Oral   Oral  SpO2: 96% 96% 95% 100%  Weight:      Height:        General: Pt is alert, awake, not in acute distress Cardiovascular: RRR, S1/S2 +, no rubs, no gallops Respiratory: CTA bilaterally, no wheezing, no rhonchi Abdominal: Soft, NT, ND, bowel sounds + Extremities: no edema, no cyanosis    The results of significant diagnostics from this hospitalization (including imaging, microbiology, ancillary and laboratory) are listed below for reference.     Microbiology: Recent Results (from the past 240 hour(s))  SARS Coronavirus 2 by RT PCR (hospital order, performed in [facility] hospital lab) Nasopharyngeal Nasopharyngeal Swab     Status: None   Collection Time: 02/25/20  3:13 PM   Specimen: Nasopharyngeal Swab  Result Value Ref Range Status   SARS Coronavirus 2 NEGATIVE NEGATIVE Final    Comment: (NOTE) SARS-CoV-2 target nucleic acids are NOT DETECTED. The SARS-CoV-2 RNA is generally detectable in upper and lower respiratory specimens during the acute phase of infection. The  lowest concentration of SARS-CoV-2 viral copies this assay can detect  is 250 copies / mL. A negative result does not preclude SARS-CoV-2 infection and should not be used as the sole basis for treatment or other patient management decisions.  A negative result may occur with improper specimen collection / handling, submission of specimen other than nasopharyngeal swab, presence of viral mutation(s) within the areas targeted by this assay, and inadequate number of viral copies (<250 copies / mL). A negative result must be combined with clinical observations, patient history, and epidemiological information. Fact Sheet for Patients:   BoilerBrush.com.cy Fact Sheet for Healthcare Providers: https://pope.com/ This test is not yet approved or cleared  by the [place] FDA and has been authorized for detection and/or diagnosis of SARS-CoV-2 by FDA under an Emergency Use Authorization (EUA).  This EUA will remain in effect (meaning this test can be used) for the duration of the COVID-19 declaration under Section 564(b)(1) of the Act, 21 U.S.C. section 360bbb-3(b)(1), unless the authorization is terminated or revoked sooner. Performed at [facility], [address], [place], [place] [address]      Labs: BNP (last 3 results) No results for input(s): BNP in the last 8760 hours. Basic Metabolic Panel: Recent Labs  Lab 02/25/20 1418  NA 136  K 3.8  CL 101  CO2 27  GLUCOSE 91  BUN 11  CREATININE 0.91  CALCIUM 9.2   Liver Function Tests: Recent Labs  Lab 02/25/20 1418  AST 20  ALT 29  ALKPHOS 111  BILITOT 0.6  PROT 7.3  ALBUMIN 3.7   No results for input(s): LIPASE, AMYLASE in the last 168 hours. No results for input(s): AMMONIA in the last 168 hours. CBC: Recent Labs  Lab 02/25/20 1418  WBC 9.1  HGB 15.3  HCT 46.6  MCV 90.5  PLT 306   Cardiac Enzymes: No results for input(s): CKTOTAL, CKMB, CKMBINDEX, TROPONINI in  the last 168 hours. BNP: Invalid input(s): POCBNP CBG: No results for input(s): GLUCAP in the last 168 hours. D-Dimer No results for input(s): DDIMER in the last 72 hours. Hgb A1c No results for input(s): HGBA1C in the last 72 hours. Lipid Profile Recent Labs    02/26/20 0544  CHOL 126  HDL 24*  LDLCALC 82  TRIG 740  CHOLHDL 5.3   Thyroid function studies No results for input(s): TSH, T4TOTAL, T3FREE, THYROIDAB in the last 72 hours.  Invalid input(s): FREET3 Anemia work up No results for input(s): VITAMINB12, FOLATE, FERRITIN, TIBC, IRON, RETICCTPCT in the last 72 hours. Urinalysis    Component Value Date/Time   COLORURINE YELLOW 04/16/2013 0032   APPEARANCEUR CLOUDY (A) 04/16/2013 0032   LABSPEC >1.030 (H) 04/16/2013 0032   PHURINE 5.5 04/16/2013 0032   GLUCOSEU NEGATIVE 04/16/2013 0032   HGBUR MODERATE (A) 04/16/2013 0032   BILIRUBINUR negative 03/22/2017 1521   KETONESUR NEGATIVE 04/16/2013 0032   PROTEINUR negative 03/22/2017 1521   PROTEINUR TRACE (A) 04/16/2013 0032   UROBILINOGEN 1.0 03/22/2017 1521   UROBILINOGEN 0.2 04/16/2013 0032   NITRITE negative 03/22/2017 1521   NITRITE NEGATIVE 04/16/2013 0032   LEUKOCYTESUR Negative 03/22/2017 1521   Sepsis Labs Invalid input(s): PROCALCITONIN,  WBC,  LACTICIDVEN Microbiology Recent Results (from the past 240 hour(s))  SARS Coronavirus 2 by RT PCR (hospital order, performed in [facility] hospital lab) Nasopharyngeal Nasopharyngeal Swab     Status: None   Collection Time: 02/25/20  3:13 PM   Specimen: Nasopharyngeal Swab  Result Value Ref Range Status   SARS Coronavirus 2 NEGATIVE NEGATIVE Final    Comment: (NOTE)  SARS-CoV-2 target nucleic acids are NOT DETECTED. The SARS-CoV-2 RNA is generally detectable in upper and lower respiratory specimens during the acute phase of infection. The lowest concentration of SARS-CoV-2 viral copies this assay can detect is 250 copies / mL. A negative result does not preclude  SARS-CoV-2 infection and should not be used as the sole basis for treatment or other patient management decisions.  A negative result may occur with improper specimen collection / handling, submission of specimen other than nasopharyngeal swab, presence of viral mutation(s) within the areas targeted by this assay, and inadequate number of viral copies (<250 copies / mL). A negative result must be combined with clinical observations, patient history, and epidemiological information. Fact Sheet for Patients:   BoilerBrush.com.cy Fact Sheet for Healthcare Providers: https://pope.com/ This test is not yet approved or cleared  by the [place] FDA and has been authorized for detection and/or diagnosis of SARS-CoV-2 by FDA under an Emergency Use Authorization (EUA).  This EUA will remain in effect (meaning this test can be used) for the duration of the COVID-19 declaration under Section 564(b)(1) of the Act, 21 U.S.C. section 360bbb-3(b)(1), unless the authorization is terminated or revoked sooner. Performed at [facility], [address] [place], [place] [address]      Time coordinating discharge: 35 minutes  SIGNED:   Erick Blinks, DO [facility] 02/26/2020, 3:31 PM  If 7PM-7AM, please contact night-coverage [email or web address]

## 2020-02-26 NOTE — Evaluation (Signed)
Occupational Therapy Evaluation Patient Details Name: Chad Gillespie MRN: [id number] DOB: 1966/06/04 Today's Date: 02/26/2020    History of Present Illness 54 y.o. year-old with hx of MI/ CAD w/ hx stent , + tobacco use, COPD sent to ED from opthomalogist [name] office for new VF deficit. Pt has been experiencing L eye lower visual field deficits for around 2 wks.  Pt seen by [name] and sent to ED to be admitted for possible stroke/ CVA as cause of his VF deficit.   Clinical Impression   Pt agreeable to OT/PT co-treatment this am. Pt reports left visual field deficit in lower right quadrant with right eye closed, has not changed in 2 weeks. Pt performing ADLs and mobility tasks independently. No further OT services required at this time.     Follow Up Recommendations  No OT follow up    Equipment Recommendations  None recommended by OT       Precautions / Restrictions Precautions Precautions: None Restrictions Weight Bearing Restrictions: No      Mobility Bed Mobility               General bed mobility comments: in chair upon arrival  Transfers Overall transfer level: Independent Equipment used: None             General transfer comment: able to rise from chair without AD, no unsteadiness    Balance Overall balance assessment: Independent                                         ADL either performed or assessed with clinical judgement   ADL Overall ADL's : Independent;At baseline                                             Vision Baseline Vision/History: Wears glasses Wears Glasses: At all times Patient Visual Report: Other (comment)(right lower quadrant of left eye with right eye closed ) Vision Assessment?: Yes Eye Alignment: Within Functional Limits Ocular Range of Motion: Within Functional Limits Alignment/Gaze Preference: Within Defined Limits Tracking/Visual Pursuits: Able to track stimulus in all quads  without difficulty Saccades: Within functional limits Convergence: Within functional limits Visual Fields: Right visual field deficit(of left eye)            Pertinent Vitals/Pain Pain Assessment: No/denies pain     Hand Dominance Right   Extremity/Trunk Assessment Upper Extremity Assessment Upper Extremity Assessment: Overall WFL for tasks assessed   Lower Extremity Assessment Lower Extremity Assessment: Defer to PT evaluation   Cervical / Trunk Assessment Cervical / Trunk Assessment: Normal   Communication Communication Communication: No difficulties   Cognition Arousal/Alertness: Awake/alert Behavior During Therapy: WFL for tasks assessed/performed Overall Cognitive Status: Within Functional Limits for tasks assessed                                     General Comments  Aware of visual deficits and able to compensate appropriately            Home Living Family/patient expects to be discharged to:: Private residence Living Arrangements: Non-relatives/Friends Available Help at Discharge: Friend(s);Available PRN/intermittently Type of Home: House Home Access: Level entry     Home  Layout: One level         Bathroom Toilet: Standard     Home Equipment: None   Additional Comments: Pt reports his roommate may have a SPC or RW if needed.      Prior Functioning/Environment Level of Independence: Independent        Comments: Pt independent with ADLs, community ambulation, grocery shopping, driving. Pt reports has glasses but doesn't wear them 24/7.        OT Problem List: Impaired vision/perception      OT Treatment/Interventions:      OT Goals(Current goals can be found in the care plan section) Acute Rehab OT Goals Patient Stated Goal: fix vision  OT Frequency:             Co-evaluation PT/OT/SLP Co-Evaluation/Treatment: Yes Reason for Co-Treatment: Complexity of the patient's impairments (multi-system involvement);To  address functional/ADL transfers PT goals addressed during session: Mobility/safety with mobility;Balance;Proper use of DME;Strengthening/ROM OT goals addressed during session: ADL's and self-care         End of Session    Activity Tolerance: Patient tolerated treatment well Patient left: in chair;with call bell/phone within reach  OT Visit Diagnosis: Other (comment)(visual field deficit)                Time: 1884-1660 OT Time Calculation (min): 9 min Charges:  OT General Charges $OT Visit: 1 Visit OT Evaluation $OT Eval Low Complexity: 1 Low   Ezra Sites, OTR/L  [phone] 02/26/2020, 8:41 AM

## 2020-02-26 NOTE — Discharge Instructions (Signed)
Hemianopia  Hemianopia, also called hemiopia or hemianopsia, is partial vision loss in the right or left half of the eye. This may affect one or both eyes. It is often caused by a stroke or brain injury. This condition causes trouble seeing and can make it difficult to do everyday tasks. What are the causes? This condition may be caused by:  Stroke.  Brain or head injury.  Brain tumor.  Bleeding in the brain.  Brain surgery.  Certain medical conditions, such as multiple sclerosis. What are the signs or symptoms? This condition causes vision loss in the right or left half of the field of vision, in one or both eyes. As a result, you may:  Have problems reading or writing.  Have frequent falls or stumbles because you cannot see well.  Bump into people or things.  Knock things over frequently.  Have problems driving.  Have problems seeing or using common things.  Get surprised by people or things that seem to appear suddenly. How is this diagnosed? This condition may be diagnosed by:  A physical exam, medical history, and your symptoms.  An eye exam.  A visual field test to check your vision in each part of your eye.  Imaging tests of your brain, such as CT scan or MRI. You may be referred to an eye doctor who specializes in eye problems that are related to the brain (neuro-ophthalmologist). How is this treated? Treatment for this condition may include:  Eye exercises.  Scanning therapy. This teaches you to look side-to-side so you can see what is in your blind spot.  Vision restoration therapy (VRT). This type of therapy involves using a computer to do eye exercises to help your eyes focus.  Special devices to read or do other tasks.  Special glasses with prisms to help your vision.  Reading strategies to help you read, such as turning the text to the side or using a straight edge to follow the text. Follow these instructions at home: Lifestyle  Make changes  to your home to avoid falls and injuries. This may include: ? Removing rugs and mats from the floor. ? Removing clutter. ? Adding rails or grab bars in the bathroom.  Do not drive or use heavy machinery until your health care provider approves.  If you have trouble with basic or daily tasks such as cooking, bathing, or washing, ask for help. General instructions  Do not use any products that contain nicotine or tobacco, such as cigarettes and e-cigarettes. If you need help quitting, ask your health care provider.  Take over-the-counter and prescription medicines only as told by your health care provider.  Do exercises and therapy as directed. Use devices and glasses as told by your health care provider.  Keep all follow-up visits as told by your health care provider. This is important. Contact a health care provider if:  You lose more of your vision.  You become dizzy or you faint.  You have problems doing your eye exercises or other therapies. Get help right away if:  You have severe pain in one or both of your eyes.  You cannot see at all.  You have a seizure. Summary  Hemianopia is partial vision loss in the right or left half of the eye. This may affect one or both eyes.  This condition is often caused by a stroke or brain injury.  You may need eye exercises, therapies, special glasses, and other treatments.  If you have trouble with basic  or daily tasks such as cooking, bathing, or washing, ask for help. This information is not intended to replace advice given to you by your health care provider. Make sure you discuss any questions you have with your health care provider. Document Revised: 09/16/2017 Document Reviewed: 01/10/2017 Elsevier Patient Education  2020 [employer].   Cognitive Rehabilitation After a Stroke After a stroke, you may have various problems with thinking (cognitive disability). The types of problems you have will depend on how severe the  stroke was and where it was located in the brain. Problems may include:  Problems with short-term memory.  Trouble paying attention.  Trouble communicating or understanding language (aphasia).  A drop in mental ability that may interfere with daily life (dementia).  Trouble with problem-solving and information processing.  Problems with reading, writing, or math.  Problems with your ability to plan and to perform activities in sequence (executive function). These problems can feel overwhelming. However, with rehabilitation and time to heal, many people have improvement in their symptoms. What causes cognitive disability? A stroke happens when blood cannot flow to certain areas of the brain. When this happens, brain cells die in the affected areas because they cannot get oxygen and nutrients from the blood. Cognitive disability is caused by the death of cells in the areas of the brain that control thinking. What is cognitive rehabilitation? Cognitive rehabilitation is a program to help you improve your thinking skills after a stroke. Rehabilitation cannot completely reverse the effects of a stroke, but it can help you with memory, problem-solving, and communication skills. Therapy focuses on:  Improving brain function. This may involve activities such as learning to break down tasks into simple steps.  Helping you learn ways to cope with thinking problems. For example, you might learn memory tricks or do activities that stimulate memory, such as naming objects or describing pictures. Cognitive rehabilitation may include:  Speech-language therapy to help you understand and use language to communicate.  Occupational therapy to help you perform daily activities.  Music therapy to help relieve stress, anxiety, and depression. This may involve listening to music, singing, or playing instruments.  Physical therapy to help improve your ability to move and perform actions that involve the  muscles (motor functions). When will therapy start and where will I have therapy? Your health care provider will decide when it is best for you to start therapy. In some cases, people start rehabilitation as soon as their health is stable, which may be 24-48 hours after the stroke. Rehabilitation can take place in a few different places, based on your needs. It may take place in:  The hospital or an in-patient rehabilitation hospital.  An outpatient rehabilitation facility.  A long-term care facility.  A community rehabilitation clinic.  Your home. What are some tools to help after a stroke? There are a number of tools and apps that you can use on your smartphone, personal computer, or tablet to help improve brain function. Some of these apps include:  Calendar reminders or alarm apps to help with memory.  Note-taking or sketch pad apps to help with memory or communication.  Text-to-speech apps that allow you to listen to what you are reading, which helps your ability to understanding text.  Picture dictionary or picture message apps to help with communication.  E-readers. These can highlight text as it is read aloud, which helps with listening and reading skills. How can my friends or family help during my rehabilitation? During your recovery, it is  important that your friends and family members help you work toward more independence. Your caregivers should speak with your health care providers to learn how they can best help you during recovery. This may include working on speech-language or memory exercises at home, or helping with daily tasks and errands. If you have cognitive disability, you may be at risk for injury or accidents at home, such as forgetting to turn off the stove. Friends and family members can help ensure home safety by taking steps such as getting appliances with automatic shut-off features or storing dangerous objects in a secure place. What else should I know  about cognitive rehabilitation after a stroke? Having trouble with memory and problem-solving can make you feel alone. You may also have mood changes, anxiety, or depression after a stroke. It is important to:  Stay connected with others through social groups, online support groups, or your community.  Talk to your friends, family, and caregivers about any emotional problems you are having.  Go to one-on-one or group therapy as suggested by your health care provider.  Stay physically active and exercise as often as suggested by your health care provider. Summary  After a stroke, some people have problems with thinking that affect attention, memory, language, communication, and problem-solving.  Cognitive rehabilitation is a program to help you regain brain function and learn skills to cope with thinking problems.  Rehabilitation cannot completely reverse the effects of a stroke, but it can help to improve quality of life.  Cognitive rehabilitation may include speech-language therapy, occupational therapy, music therapy, and physical therapy. This information is not intended to replace advice given to you by your health care provider. Make sure you discuss any questions you have with your health care provider. Document Revised: 01/24/2019 Document Reviewed: 01/07/2017 Elsevier Patient Education  2020 [employer].   Physical Therapy After a Stroke After a stroke, some people experience physical changes or problems. Physical therapy may be prescribed to help you recover and overcome problems such as:  Inability to move (paralysis) or weakness, typically affecting one side of the body.  Trouble with balance.  Pain, a pins and needles sensation, or numbness in certain parts of the body. You may also have difficulty feeling touch, pressure, or changes in temperature.  Involuntary muscle tightening (spasticity).  Stiffness in muscles and joints.  Altered coordination and  reflexes. What causes physical disability after a stroke? A stroke can damage parts of your brain that control your body's normal functions, including your ability to move and to keep your balance. The types of physical problems you have will depend on how severe the stroke was and where it was located in the brain. Weakness or paralysis may affect just your fingers and hands, a whole leg or arm, or an entire side of your body. What is physical therapy? Physical therapy involves using exercises, stretches, and activities to help you regain movement and independence after your stroke. Physical therapy may focus on one or more of the following:  Range of motion. This can help with movement and reduce muscle stiffness.  Balance. This helps to lower your risk of falling.  Position changes or transfers, such as moving from sitting to standing or from a chair to a bed.  Coordination, such as getting an object from a shelf.  Muscle strength. Muscles may be strengthened with weights or by repeating certain motions.  Functional mobility. This may include stair training or learning how to use a wheelchair, walker, or cane.  Walking (gait training).  Activities of daily living, such as getting out of the car or buttoning a shirt. Why is physical therapy important? It is important to do exercises and follow your rehabilitation plan as told by your physical therapist. Physical therapy can:  Help you regain independence.  Prevent injury from falls by building strength and balance.  Lower your risk of blood clots.  Lower your risk of skin sores (pressure injuries).  Increase physical activity and exercise. This may help lower your risk for another stroke.  Help reduce pain. When will therapy start and where will I have therapy? Your health care provider will decide when it is best for you to start therapy. In some cases, people start rehabilitation, including physical therapy, as soon as they  are medically stable, which may be 24-48 hours after a stroke. Rehabilitation can take place in a few different places, based on your needs. It may take place in:  The hospital or an in-patient rehabilitation hospital.  An outpatient rehabilitation facility.  A long-term care facility.  A community rehabilitation clinic.  Your home. What are assistive devices? Assistive devices are tools to help you move, maintain balance, and manage daily tasks while recovering from a stroke. Your physical therapist may recommend and help you learn to use:  Equipment to help you move, such as wheelchairs, canes, or walkers.  Braces or splints to keep your arms, hands, legs, or feet in a comfortable and safe position.  Bathtub benches or grab bars to keep you safe in the bathroom.  Special utensils, bowls, and plates that allow you to eat with one hand. It is important to use these devices as told by your health care provider. Summary  After a stroke, some people may experience physical disabilities, such as weakness or paralysis, pain, or balance problems.  Physical therapy involves exercises, stretches, and activities that help to improve your ability to move and to handle daily tasks.  Physical therapy exercises focus on restoring range of motion, balance, coordination, muscle strength, and the ability to move (mobility).  Physical therapy can help you regain independence, prevent falls, and allow you to live a more active lifestyle after a stroke. This information is not intended to replace advice given to you by your health care provider. Make sure you discuss any questions you have with your health care provider. Document Revised: 01/25/2019 Document Reviewed: 01/10/2017 Elsevier Patient Education  2020 [employer].   Eating Plan After Stroke A stroke causes damage to the brain cells, which can affect your ability to walk, talk, and even eat. The impact of a stroke is different for  everyone, and so is recovery. A good nutrition plan is important for your recovery. It can also lower your risk of another stroke. If you have difficulty chewing and swallowing your food, a dietitian or your stroke care team can help so that you can enjoy eating healthy foods. What are tips for following this plan?  Reading food labels  Choose foods that have less than 300 milligrams (mg) of sodium per serving. Limit your sodium intake to less than 1,500 mg per day.  Avoid foods that have saturated fat and trans fat.  Choose foods that are low in cholesterol. Limit the amount of cholesterol you eat each day to less than 200 mg.  Choose foods that are high in fiber. Eat 20-30 grams (g) of fiber each day.  Avoid foods with added sugar. Check the food label for ingredients such as sugar,  corn syrup, honey, fructose, molasses, and cane juice. Shopping  At the grocery store, buy most of your food from areas near the walls of the store. This includes: ? Fresh fruits and vegetables. ? Dry grains, beans, nuts, and seeds. ? Fresh seafood, poultry, lean meats, and eggs. ? Low-fat dairy products.  Buy whole ingredients instead of prepackaged foods.  Buy fresh, in-season fruits and vegetables from local farmers markets.  Buy frozen fruits and vegetables in resealable bags. Cooking  Prepare foods with very little salt. Use herbs or salt-free spices instead.  Cook with heart-healthy oils, such as olive, avocado, canola, soybean, or sunflower oil.  Avoid frying foods. Bake, grill, or broil foods instead.  Remove visible fat and skin from meat and poultry before eating.  Modify food textures as told by your health care provider. Meal planning  Eat a wide variety of colorful fruits and vegetables. Make sure one-half of your plate is filled with fruits and vegetables at each meal.  Eat fruits and vegetables that are high in potassium, such as: ? Apples, bananas, oranges, and melon. ? Sweet  potatoes, spinach, zucchini, and tomatoes.  Eat fish that contain heart-healthy fats (omega-3 fats) at least twice a week. These include salmon, tuna, mackerel, and sardines.  Eat plant foods that are high in omega-3 fats, such as flaxseeds and walnuts. Add these to cereals, yogurt, or pasta dishes.  Eat several servings of high-fiber foods each day, such as fruits, vegetables, whole grains, and beans.  Do not put salt at the table for meals.  When eating out at restaurants: ? Ask the server about low-salt or salt-free food options. ? Avoid fried foods. Look for menu items that are grilled, steamed, broiled, or roasted. ? Ask if your food can be prepared without butter. ? Ask for condiments, such as salad dressings, gravy, or sauces to be served on the side.  If you have difficulty swallowing: ? Choose foods that are softer and easier to chew and swallow. ? Cut foods into small pieces and chew well before swallowing. ? Thicken liquids as told by your health care provider or dietitian. ? Let your health care provider know if your condition does not improve over time. You may need to work with a speech therapist to re-train the muscles that are used for eating. General recommendations  Involve your family and friends in your recovery, if possible. It may be helpful to have a slower meal time and to plan meals that include foods everyone in the family can eat.  Brush your teeth with fluoride toothpaste twice a day, and floss once a day. Keeping a clean mouth can help you swallow and can also help your appetite.  Drink enough water each day to keep your urine pale yellow. If needed, set reminders or ask your family to help you remember to drink water.  Limit alcohol intake to no more than 1 drink a day for nonpregnant women and 2 drinks a day for men. One drink equals 12 oz of beer, 5 oz of wine, or 1 oz of hard liquor. Summary  Following this eating plan can help in your stroke recovery  and can decrease your risk for another stroke.  Let your health care provider know if you have problems with swallowing. You may need to work with a speech therapist. This information is not intended to replace advice given to you by your health care provider. Make sure you discuss any questions you have with your health care  provider. Document Revised: 01/25/2019 Document Reviewed: 12/12/2017 Elsevier Patient Education  2020 [employer]   Hospital Discharge After a Stroke  Being discharged from the hospital after a stroke can feel overwhelming. Many things may be different, and it is normal to feel scared or anxious. Some stroke survivors may be able to return to their homes, and others may need more specialized care on a temporary or permanent basis. Your stroke care team will work with you to develop a discharge plan that is best for you. Ask questions if you do not understand something. Invite a friend or family member to participate in discharge planning. Understanding and following your discharge plan can help to prevent another stroke or other problems. Understanding your medicines After a stroke, your health care provider may prescribe one or more types of medicine. It is important to take medicines exactly as told by your health care provider. Serious harm, such as another stroke, can happen if you are unable to take your medicine exactly as prescribed. Make sure you understand:  What medicine to take.  Why you are taking the medicine.  How and when to take it.  If it can be taken with your other medicines and herbal supplements.  Possible side effects.  When to call your health care provider if you have any side effects.  How you will get and pay for your medicines. Medical assistance programs may be able to help you pay for prescription medicines if you cannot afford them. If you are taking an anticoagulant, be sure to take it exactly as told by your health care provider.  This type of medicine can increase the risk of bleeding because it works to prevent blood from clotting. You may need to take certain precautions to prevent bleeding. You should contact your health care provider if you have:  Bleeding or bruising.  A fall or other injury to your head.  Blood in your urine or stool (feces). Planning for home safety  Take steps to prevent falls, such as installing grab bars or using a shower chair. Ask a friend or family member to get needed things in place before you go home if possible. A therapist can come to your home to make recommendations for safety equipment. Ask your health care provider if you would benefit from this service or from home care. Getting needed equipment Ask your health care provider for a list of any medical equipment and supplies you will need at home. These may include items such as:  Walkers.  Canes.  Wheelchairs.  Hand-strengthening devices.  Special eating utensils. Medical equipment can be rented or purchased, depending on your insurance coverage. Check with your insurance company about what is covered. Keeping follow-up visits After a stroke, you will need to follow up regularly with a health care provider. You may also need rehabilitation, which can include physical therapy, occupational therapy, or speech-language therapy. Keeping these appointments is very important to your recovery after a stroke. Be sure to bring your medicine list and discharge papers with you to your appointments. If you need help to keep track of your schedule, use a calendar or appointment reminder. Preventing another stroke Having a stroke puts you at risk for another stroke in the future. Ask your health care provider what actions you can take to lower the risk. These may include:  Increasing how much you exercise.  Making a healthy eating plan.  Quitting smoking.  Managing other health conditions, such as high blood pressure, high  cholesterol, or diabetes.  Limiting alcohol use. Knowing the warning signs of a stroke  Make sure you understand the signs of a stroke. Before you leave the hospital, you will receive information outlining the stroke warning signs. Share these with your friends and family members. "BE FAST" is an easy way to remember the main warning signs of a stroke:  B - Balance. Signs are dizziness, sudden trouble walking, or loss of balance.  E - Eyes. Signs are trouble seeing or a sudden change in vision.  F - Face. Signs are sudden weakness or numbness of the face, or the face or eyelid drooping on one side.  A - Arms. Signs are weakness or numbness in an arm. This happens suddenly and usually on one side of the body.  S - Speech. Signs are sudden trouble speaking, slurred speech, or trouble understanding what people say.  T - Time. Time to call emergency services. Write down what time symptoms started. Other signs of stroke may include:  A sudden, severe headache with no known cause.  Nausea or vomiting.  Seizure. These symptoms may represent a serious problem that is an emergency. Do not wait to see if the symptoms will go away. Get medical help right away. Call your local emergency services (911 in the [place] Do not drive yourself to the hospital. Make note of the time that you had your first symptoms. Your emergency responders or emergency room staff will need to know this information. Summary  Being discharged from the hospital after a stroke can feel overwhelming. It is normal to feel scared or anxious.  Make sure you take medicines exactly as told by your health care provider.  Know the warning signs of a stroke, and get help right way if you have any of these symptoms. "BE FAST" is an easy way to remember the main warning signs of a stroke. This information is not intended to replace advice given to you by your health care provider. Make sure you discuss any questions you have with  your health care provider. Document Revised: 06/27/2019 Document Reviewed: 01/07/2017 Elsevier Patient Education  2020 [employer].   Rehabilitation After a Stroke, Adult A stroke causes damage to the brain cells, which can affect your ability to walk, talk, or remember things. The impact of a stroke is different for everyone, and so is recovery. Some people have progress during the first few days after treatment. Others may take weeks or longer to make progress. Stroke rehabilitation includes a variety of treatments to help you recover and promote your independence after a stroke. You may not be able do everything that you did before the stroke, but you can learn ways to manage your lifestyle and be as independent as possible. Rehabilitation will start as soon as you are able to participate after your stroke, and it involves care from a team that may include:  Family and friends. Your loved ones know you best and can be very helpful in your recovery.  Physicians.  Nurses.  Physical and occupational therapists.  Speech-language therapists.  A nutritionist.  A psychologist.  A Child psychotherapist. Keep open communication with all members of your care team. Share your medical records if needed, and take notes about each provider's recommendations. What is physical therapy? Physical therapists (PTs) help you to improve your coordination, balance, and muscle strength. Physical therapy may involve:  Range of motion exercises.  Help to move between lying, sitting, and standing positions.  Walking with a cane  or walker, if needed.  Help using stairs. What is occupational therapy? Occupational therapists (OTs) help you rebuild your ability to do everyday tasks, such as brushing your teeth, going to the bathroom, eating, and getting dressed. Occupational therapy may also help with:  Vision. Visual scanning is a technique that is used to prevent falls.  Memory and cognitive training. This  therapy includes problem-solving techniques and relearning tasks like making a phone call.  Fine muscle movements such as buttoning a shirt or picking up small objects. What is speech therapy? Speech-language therapists help you communicate. After a stroke, you may have problems understanding what people are saying, or you may have trouble writing, speaking, or finding the right word for what you want to say. You may also need speech therapy if you have difficulty swallowing while eating and drinking. Examples of speech-language therapies include:  Techniques to strengthen muscles used in swallowing.  Naming objects or describing pictures. This helps retrain the brain to recognize and remember words.  Exercises to strengthen the muscles involved in talking, including your tongue and lips.  Exercises to retrain your brain in understanding what you read and hear. How often will I need therapy? Therapy will begin as soon as you are able to participate, which is often within the first few days after a stroke. Sessions will be frequent at first. For example, you may have therapy 2-3 hours a day on most days of the week during the first few months. The intensity depends on the type and severity of your stroke. You may need therapy for several months. Therapy may take place in the hospital, at a rehabilitation center, or in your home. Are there any side effects of therapy? Therapy is safe and is usually well-tolerated. You may feel physically and mentally tired after therapy, especially during the first few weeks. Rest before therapy sessions if you need to so you can get the most out of your rehabilitation. Follow these instructions at home:  Involve your family and friends in your recovery, if possible. Having another person to encourage you is beneficial.  Follow instructions from your speech-language therapist, nutritionist, or health care provider about what you can safely eat and drink. Eat  healthy foods. If your ability to swallow was affected by the stroke, you may need to take steps to avoid choking, such as: ? Taking small bites when eating. ? Eating foods that are soft or pureed. ? Drinking liquids that have been thickened.  Maintain social connections and interactions with friends, family, and community groups. This is an important part of your recovery. Communication challenges and physical challenges may cause you to feel isolated after a stroke.  Consider joining a support group that allows you to talk about the impact of stroke on your life. A psychologist or counselor may be recommended. Your emotional recovery from stroke is just as important as your physical recovery.  Keep all follow-up visits as told by your health care providers. This is important. Summary  Stroke rehabilitation includes a variety of treatments to help you recover and promote your independence after a stroke.  Rehabilitation will start as soon as you are able to participate after your stroke, and it includes care from a team of experts.  The intensity of therapy depends on the type and severity of your stroke. You may need therapy for several months. This information is not intended to replace advice given to you by your health care provider. Make sure you discuss any questions you  have with your health care provider. Document Revised: 01/24/2019 Document Reviewed: 10/05/2016 Elsevier Patient Education  2020 [employer].

## 2020-02-26 NOTE — Plan of Care (Signed)

## 2020-02-26 NOTE — Evaluation (Signed)
Physical Therapy Evaluation Patient Details Name: Chad Gillespie MRN: [id number] DOB: 09/26/1966 Today's Date: 02/26/2020   History of Present Illness  54 y.o. year-old with hx of MI/ CAD w/ hx stent , + tobacco use, COPD sent to ED from opthomalogist [name] office for new VF deficit. Pt has been experiencing L eye lower visual field deficits for around 2 wks.  Pt seen by [name] and sent to ED to be admitted for possible stroke/ CVA as cause of his VF deficit.  Clinical Impression  Physical therapy evaluation completed, patient is at baseline and no further PT services recommended at this time. Pt able to ambulate around furniture in room and in hallway completing direction changes, speed changes and head turns without unsteadiness or falls. Pt aware of visual deficits and able to compensate appropriately. Patient discharged to care of nursing for ambulation daily as tolerated for length of stay.     Follow Up Recommendations No PT follow up    Equipment Recommendations  None recommended by PT    Recommendations for Other Services       Precautions / Restrictions Precautions Precautions: None Restrictions Weight Bearing Restrictions: No      Mobility  Bed Mobility  General bed mobility comments: in chair upon arrival  Transfers Overall transfer level: Independent Equipment used: None  General transfer comment: able to rise from chair without AD, no unsteadiness  Ambulation/Gait Ambulation/Gait assistance: Independent Gait Distance (Feet): 150 Feet Assistive device: None Gait Pattern/deviations: WFL(Within Functional Limits) Gait velocity: WNL   General Gait Details: ambulates in hallway with good bil foot clearance, able to change speeds and complete head turns with good balance, no loss of balance  Stairs            Wheelchair Mobility    Modified Rankin (Stroke Patients Only)       Balance Overall balance assessment: Independent           Pertinent Vitals/Pain Pain Assessment: No/denies pain    Home Living Family/patient expects to be discharged to:: Private residence Living Arrangements: Non-relatives/Friends Available Help at Discharge: Friend(s);Available PRN/intermittently Type of Home: House Home Access: Level entry     Home Layout: One level Home Equipment: None Additional Comments: Pt reports his roommate may have a SPC or RW if needed.    Prior Function Level of Independence: Independent         Comments: Pt independent with ADLs, community ambulation, grocery shopping, driving. Pt reports has glasses but doesn't wear them 24/7.     Hand Dominance        Extremity/Trunk Assessment   Upper Extremity Assessment Upper Extremity Assessment: Defer to OT evaluation    Lower Extremity Assessment Lower Extremity Assessment: Overall WFL for tasks assessed(BLE AROM WNL, strength 4+/5, sensation intact)    Cervical / Trunk Assessment Cervical / Trunk Assessment: Normal  Communication   Communication: No difficulties  Cognition Arousal/Alertness: Awake/alert Behavior During Therapy: WFL for tasks assessed/performed Overall Cognitive Status: Within Functional Limits for tasks assessed    General Comments General comments (skin integrity, edema, etc.): Aware of visual deficits and able to compensate appropriately    Exercises     Assessment/Plan    PT Assessment Patent does not need any further PT services  PT Problem List         PT Treatment Interventions      PT Goals (Current goals can be found in the Care Plan section)  Acute Rehab PT Goals Patient Stated Goal:  fix vision PT Goal Formulation: With patient Time For Goal Achievement: 02/26/20 Potential to Achieve Goals: Good    Frequency     Barriers to discharge        Co-evaluation PT/OT/SLP Co-Evaluation/Treatment: Yes Reason for Co-Treatment: To address functional/ADL transfers PT goals addressed during session:  Mobility/safety with mobility;Balance;Proper use of DME;Strengthening/ROM         AM-PAC PT "6 Clicks" Mobility  Outcome Measure Help needed turning from your back to your side while in a flat bed without using bedrails?: None Help needed moving from lying on your back to sitting on the side of a flat bed without using bedrails?: None Help needed moving to and from a bed to a chair (including a wheelchair)?: None Help needed standing up from a chair using your arms (e.g., wheelchair or bedside chair)?: None Help needed to walk in hospital room?: None Help needed climbing 3-5 steps with a railing? : None 6 Click Score: 24    End of Session   Activity Tolerance: Patient tolerated treatment well Patient left: in chair;with call bell/phone within reach Nurse Communication: Mobility status PT Visit Diagnosis: Other symptoms and signs involving the nervous system (R29.898)    Time: 0750-0800 PT Time Calculation (min) (ACUTE ONLY): 10 min   Charges:   PT Evaluation $PT Eval Low Complexity: 1 Low          Tori Chantay Whitelock PT, DPT 02/26/20, 8:32 AM

## 2020-02-26 NOTE — Progress Notes (Signed)
SLP Cancellation Note  Patient Details Name: Chad Gillespie MRN: [id number] DOB: May 05, 1966   Cancelled treatment:       Reason Eval/Treat Not Completed: SLP screened, no needs identified, will sign off; SLP screened Pt in room. Pt denies any changes in swallowing, speech, language, or cognition.  SLE will be deferred at this time. Reconsult if indicated. SLP will sign off.   Thank you,  Havery Moros, CCC-SLP [phone]  [name] 02/26/2020, 2:15 PM

## 2020-02-26 NOTE — Progress Notes (Signed)
*  PRELIMINARY RESULTS* Echocardiogram 2D Echocardiogram with definity has been performed.  Chad Gillespie 02/26/2020, 9:45 AM

## 2020-02-27 ENCOUNTER — Telehealth: Payer: Self-pay

## 2020-02-27 LAB — HIV ANTIBODY (ROUTINE TESTING W REFLEX): HIV Screen 4th Generation wRfx: NONREACTIVE

## 2020-02-27 NOTE — Telephone Encounter (Signed)
Transition Care Management Follow-up Telephone Call  Date of discharge and from where: [facility]  How have you been since you were released from the hospital? He stated he is doing all right. His vision is " like it was."  No change from when he was hospitalized  Any questions or concerns? none at this time   Items Reviewed:  Did the pt receive and understand the discharge instructions provided?  yes, No questions at this time  Medications obtained and verified? he said that he has the medication list and all of the meds except he needs to pick up the plavix and plans to do so this afternoon. He has someone to drive him. No questions about medications at this time   Any new allergies since your discharge?  none reported  Do you have support at home?  has a rommmate  Other (ie: DME, Home Health, etc) no home health or DME ordered.  He has a CPAP  He understands that he is not to be driving now.   Functional Questionnaire: (I = Independent and D = Dependent) ADL's:independent   Follow up appointments reviewed:    PCP Hospital f/u appt confirmed?appointment with Ms Randa Evens, NP 03/07/2020 @ 1050  Specialist Hospital f/u appt confirmed? needs to call neurology to schedule appt.  Ophthalmology - 03/10/2020; Cardiology - 04/03/2020  Are transportation arrangements needed?  no  If their condition worsens, is the pt aware to call  their PCP or go to the ED? yes  Was the patient provided with contact information for the PCP's office or ED? he has the phone numbers for the clinic  Was the pt encouraged to call back with questions or concerns?  yes

## 2020-02-28 ENCOUNTER — Telehealth (INDEPENDENT_AMBULATORY_CARE_PROVIDER_SITE_OTHER): Payer: Self-pay

## 2020-02-28 NOTE — Telephone Encounter (Signed)
Patient called stating he was seen be Dr. Dione Booze and was advice to go to the ED for further evaluation. Patient states he went to the ED and was advice to contact PCP to send a referral to she a Neurologist. Patient will like for the referral to go to Dr. Biagio Borg in [address] [place], [place] [address] ph: [phone] fax: [phone]  Please send referral if appropriate   Please advice [phone]

## 2020-02-29 ENCOUNTER — Other Ambulatory Visit (INDEPENDENT_AMBULATORY_CARE_PROVIDER_SITE_OTHER): Payer: Self-pay | Admitting: Primary Care

## 2020-02-29 DIAGNOSIS — I639 Cerebral infarction, unspecified: Secondary | ICD-10-CM

## 2020-03-05 ENCOUNTER — Other Ambulatory Visit (INDEPENDENT_AMBULATORY_CARE_PROVIDER_SITE_OTHER): Payer: Self-pay | Admitting: Primary Care

## 2020-03-05 NOTE — Telephone Encounter (Signed)
Sent to PCP ?

## 2020-03-07 ENCOUNTER — Ambulatory Visit (INDEPENDENT_AMBULATORY_CARE_PROVIDER_SITE_OTHER): Payer: Medicaid Other | Admitting: Primary Care

## 2020-03-10 NOTE — Telephone Encounter (Signed)
I spoke with patient. He wanted to make Dr Antoine Poche aware of his recent ED visit. Note is in Epic.  Patient is following up with PCP on 5/26. He is waiting to hear from neurology regarding follow up appointment. He saw Dr Dione Booze today and vision issues are the same.  Patient is scheduled to see Randall An on 6/17.  I told patient to keep scheduled appointments. I let him know I would send Dr Antoine Poche a message regarding recent ED visit and we would call him back if any changes needed prior to upcoming appointment on 6/17

## 2020-03-12 ENCOUNTER — Encounter (INDEPENDENT_AMBULATORY_CARE_PROVIDER_SITE_OTHER): Payer: Self-pay | Admitting: Primary Care

## 2020-03-12 ENCOUNTER — Ambulatory Visit (INDEPENDENT_AMBULATORY_CARE_PROVIDER_SITE_OTHER): Payer: Medicaid Other | Admitting: Primary Care

## 2020-03-12 ENCOUNTER — Other Ambulatory Visit: Payer: Self-pay

## 2020-03-12 VITALS — BP 136/82 | HR 88 | Temp 97.3°F | Ht 72.0 in | Wt 327.0 lb

## 2020-03-12 DIAGNOSIS — G4733 Obstructive sleep apnea (adult) (pediatric): Secondary | ICD-10-CM

## 2020-03-12 DIAGNOSIS — I2510 Atherosclerotic heart disease of native coronary artery without angina pectoris: Secondary | ICD-10-CM

## 2020-03-12 DIAGNOSIS — R5381 Other malaise: Secondary | ICD-10-CM

## 2020-03-12 NOTE — Progress Notes (Signed)
Acute Office Visit  Subjective:    Patient ID: Chad Gillespie, male    DOB: 26-Jun-1966, 54 y.o.   MRN: [id number]  Chief Complaint  Patient presents with  . Letter for School/Work    HPI Chad Gillespie is primarily being seen today to validate he has not worked and due to medical reasons is not working. Denies shortness of breath, headaches, chest pain or lower extremity edema Past Medical History:  Diagnosis Date  . CAD (coronary artery disease)    a. s/p NSTEMI in 01/2017 with DESx2 to RCA  . MI (myocardial infarction) (HCC)   . Tobacco abuse     Past Surgical History:  Procedure Laterality Date  . CORONARY STENT INTERVENTION N/A 02/04/2017   Procedure: Coronary Stent Intervention;  Surgeon: Peter M Swaziland, MD;  Location: [facility] INVASIVE CV LAB;  Service: Cardiovascular;  Laterality: N/A;  . ESOPHAGOGASTRODUODENOSCOPY (EGD) WITH ESOPHAGEAL DILATION    . LEFT HEART CATH AND CORONARY ANGIOGRAPHY N/A 02/04/2017   Procedure: Left Heart Cath and Coronary Angiography;  Surgeon: Peter M Swaziland, MD;  Location: [facility] INVASIVE CV LAB;  Service: Cardiovascular;  Laterality: N/A;  . LEFT HEART CATH AND CORONARY ANGIOGRAPHY N/A 01/18/2020   Procedure: LEFT HEART CATH AND CORONARY ANGIOGRAPHY;  Surgeon: Yvonne Kendall, MD;  Location: [facility] INVASIVE CV LAB;  Service: Cardiovascular;  Laterality: N/A;    Family History  Problem Relation Age of Onset  . Hypertension Mother   . Cancer Father   . Heart attack Father   . Diabetes Father   . Diabetes Sister   . Cancer Sister     Social History   Socioeconomic History  . Marital status: Single    Spouse name: Not on file  . Number of children: Not on file  . Years of education: Not on file  . Highest education level: Not on file  Occupational History  . Not on file  Tobacco Use  . Smoking status: Current Every Day Smoker    Packs/day: 0.10    Types: Cigarettes    Start date: 04/11/1986  . Smokeless tobacco: Never Used  Substance and  Sexual Activity  . Alcohol use: Yes    Comment: occasionally   . Drug use: No  . Sexual activity: Not on file  Other Topics Concern  . Not on file  Social History Narrative  . Not on file   Social Determinants of Health   Financial Resource Strain:   . Difficulty of Paying Living Expenses:   Food Insecurity:   . Worried About Programme researcher, broadcasting/film/video in the Last Year:   . Barista in the Last Year:   Transportation Needs:   . Freight forwarder (Medical):   Marland Kitchen Lack of Transportation (Non-Medical):   Physical Activity:   . Days of Exercise per Week:   . Minutes of Exercise per Session:   Stress:   . Feeling of Stress :   Social Connections:   . Frequency of Communication with Friends and Family:   . Frequency of Social Gatherings with Friends and Family:   . Attends Religious Services:   . Active Member of Clubs or Organizations:   . Attends Banker Meetings:   Marland Kitchen Marital Status:   Intimate Partner Violence:   . Fear of Current or Ex-Partner:   . Emotionally Abused:   Marland Kitchen Physically Abused:   . Sexually Abused:     Outpatient Medications Prior to Visit  Medication Sig Dispense Refill  .  albuterol (VENTOLIN HFA) 108 (90 Base) MCG/ACT inhaler INHALE 2 PUFFS INTO THE LUNGS EVERY 6 HOURS AS NEEDED FOR WHEEZING OR SHORTNESS OF BREATH (Patient taking differently: Inhale 2 puffs into the lungs every 6 (six) hours as needed for wheezing or shortness of breath. ) 6.7 g 1  . aspirin 81 MG chewable tablet Chew 1 tablet (81 mg total) by mouth daily. 30 tablet 11  . atorvastatin (LIPITOR) 80 MG tablet Take 1 tablet (80 mg total) by mouth daily at 6 PM. 30 tablet 11  . gabapentin (NEURONTIN) 100 MG capsule Take 1 capsule (100 mg total) by mouth 3 (three) times daily. 90 capsule 1  . isosorbide mononitrate (IMDUR) 120 MG 24 hr tablet Take 1 tablet (120 mg total) by mouth daily. 30 tablet 11  . nicotine (NICODERM CQ) 14 mg/24hr patch Place 1 patch (14 mg total) onto the  skin daily. 28 patch 0  . nicotine (NICODERM CQ) 21 mg/24hr patch Place 1 patch (21 mg total) onto the skin daily. 28 patch 0  . nitroGLYCERIN (NITROSTAT) 0.4 MG SL tablet Place 1 tablet (0.4 mg total) under the tongue every 5 (five) minutes as needed for chest pain. 50 tablet 3  . Tiotropium Bromide Monohydrate (SPIRIVA RESPIMAT) 2.5 MCG/ACT AERS Inhale 2 puffs into the lungs daily. 8 g 5  . Tiotropium Bromide-Olodaterol (STIOLTO RESPIMAT) 2.5-2.5 MCG/ACT AERS Inhale 2 puffs into the lungs daily. 4 g 6   No facility-administered medications prior to visit.    Allergies  Allergen Reactions  . Penicillins Hives    Review of Systems  All other systems reviewed and are negative.      Objective:    Physical Exam General: Vital signs reviewed.  Patient is well-developed and well-nourished, in no acute distress and cooperative with exam.  Head: Normocephalic and atraumatic. Eyes: EOMI, conjunctivae normal, no scleral icterus.  Neck: Supple, trachea midline, normal ROM, no JVD, masses, thyromegaly, or carotid bruit present.  Cardiovascular: RRR, S1 normal, S2 normal, no murmurs, gallops, or rubs. Pulmonary/Chest: Clear to auscultation bilaterally, no wheezes, rales, or rhonchi. Abdominal: Soft, non-tender, non-distended, BS +, no masses, organomegaly, or guarding present.  Musculoskeletal: No joint deformities, erythema, or stiffness, ROM full and nontender. Extremities: No lower extremity edema bilaterally,  pulses symmetric and intact bilaterally. No cyanosis or clubbing. Neurological: A&O x3, Strength is normal and symmetric bilaterally, cranial nerve II-XII are grossly intact, no focal motor deficit, sensory intact to light touch bilaterally.  Skin: Warm, dry and intact. No rashes or erythema. Psychiatric: Normal mood and affect. speech and behavior is normal. Cognition and memory are normal.  BP 136/82 (BP Location: Right Arm, Patient Position: Sitting, Cuff Size: Large)   Pulse 88    Temp (!) 97.3 F (36.3 C) (Temporal)   Ht 6' (1.829 m)   Wt (!) 327 lb (148.3 kg)   BMI 44.35 kg/m  Wt Readings from Last 3 Encounters:  03/12/20 (!) 327 lb (148.3 kg)  02/25/20 (!) 332 lb 10.8 oz (150.9 kg)  02/20/20 (!) 327 lb (148.3 kg)    Health Maintenance Due  Topic Date Due  . COVID-19 Vaccine (1) Never done  . COLONOSCOPY  Never done    There are no preventive care reminders to display for this patient.   Lab Results  Component Value Date   TSH 1.540 03/22/2017   Lab Results  Component Value Date   WBC 9.1 02/25/2020   HGB 15.3 02/25/2020   HCT 46.6 02/25/2020   MCV 90.5  02/25/2020   PLT 306 02/25/2020   Lab Results  Component Value Date   NA 136 02/25/2020   K 3.8 02/25/2020   CO2 27 02/25/2020   GLUCOSE 91 02/25/2020   BUN 11 02/25/2020   CREATININE 0.91 02/25/2020   BILITOT 0.6 02/25/2020   ALKPHOS 111 02/25/2020   AST 20 02/25/2020   ALT 29 02/25/2020   PROT 7.3 02/25/2020   ALBUMIN 3.7 02/25/2020   CALCIUM 9.2 02/25/2020   ANIONGAP 8 02/25/2020   Lab Results  Component Value Date   CHOL 126 02/26/2020   Lab Results  Component Value Date   HDL 24 (L) 02/26/2020   Lab Results  Component Value Date   LDLCALC 82 02/26/2020   Lab Results  Component Value Date   TRIG 101 02/26/2020   Lab Results  Component Value Date   CHOLHDL 5.3 02/26/2020   Lab Results  Component Value Date   HGBA1C 6.2 (A) 02/19/2020       Assessment & Plan:   Carols was seen today for letter for school/work.  Diagnoses and all orders for this visit:  Physical deconditioning -     Ambulatory referral to Physical Therapy  OSA (obstructive sleep apnea) Followed by pulmonology   Coronary artery disease involving native coronary artery of native heart without angina pectoris Followed by cardiology Dr. Antoine Poche      No orders of the defined types were placed in this encounter.    [name], NP

## 2020-03-12 NOTE — Progress Notes (Signed)
Pt needs letter of medical history for child support stating if he can or can not work

## 2020-04-01 ENCOUNTER — Other Ambulatory Visit: Payer: Self-pay

## 2020-04-01 ENCOUNTER — Ambulatory Visit (HOSPITAL_COMMUNITY): Payer: Medicaid Other | Attending: Primary Care | Admitting: Physical Therapy

## 2020-04-01 ENCOUNTER — Encounter (HOSPITAL_COMMUNITY): Payer: Self-pay | Admitting: Physical Therapy

## 2020-04-01 DIAGNOSIS — M6281 Muscle weakness (generalized): Secondary | ICD-10-CM | POA: Insufficient documentation

## 2020-04-01 NOTE — Therapy (Signed)
[facility] [address] [place], [place], [address] Phone: [phone]   Fax:  [phone]  Physical Therapy Evaluation  Patient Details  Name: Chad Gillespie MRN: [id number] Date of Birth: 1966-09-13 Referring Provider (PT): Gwinda Passe NP    Encounter Date: 04/01/2020   PT End of Session - 04/01/20 1827    Visit Number 1    Number of Visits 8    Date for PT Re-Evaluation 05/02/20    Authorization Type Medicaid (submitted 3 visits 04/01/20 check auth)    Authorization - Visit Number 1    Authorization - Number of Visits 1    Progress Note Due on Visit 4    PT Start Time 1645    PT Stop Time 1730    PT Time Calculation (min) 45 min    Activity Tolerance Patient limited by fatigue;Patient tolerated treatment well    Behavior During Therapy [facility] for tasks assessed/performed           Past Medical History:  Diagnosis Date  . CAD (coronary artery disease)    a. s/p NSTEMI in 01/2017 with DESx2 to RCA  . MI (myocardial infarction) (HCC)   . Tobacco abuse     Past Surgical History:  Procedure Laterality Date  . CORONARY STENT INTERVENTION N/A 02/04/2017   Procedure: Coronary Stent Intervention;  Surgeon: Peter M Swaziland, MD;  Location: [facility] INVASIVE CV LAB;  Service: Cardiovascular;  Laterality: N/A;  . ESOPHAGOGASTRODUODENOSCOPY (EGD) WITH ESOPHAGEAL DILATION    . LEFT HEART CATH AND CORONARY ANGIOGRAPHY N/A 02/04/2017   Procedure: Left Heart Cath and Coronary Angiography;  Surgeon: Peter M Swaziland, MD;  Location: [facility] INVASIVE CV LAB;  Service: Cardiovascular;  Laterality: N/A;  . LEFT HEART CATH AND CORONARY ANGIOGRAPHY N/A 01/18/2020   Procedure: LEFT HEART CATH AND CORONARY ANGIOGRAPHY;  Surgeon: Yvonne Kendall, MD;  Location: [facility] INVASIVE CV LAB;  Service: Cardiovascular;  Laterality: N/A;    There were no vitals filed for this visit.    Subjective Assessment - 04/01/20 1702    Subjective Patient presents to physical  therapy with complaint of decreased activity tolerance. Patient describes having heart attack in 01/2017. Patient had surgery to implant 2 stents in his heart. Says he recently had stress test and found that he still has significant blockage of several vessels in his heart, including 1 of the stents. Patient says he was placed on time released nitroglycerine. Says the only other option at this point is by-pass surgery. Patient also reports history on COPD and that he uses bipap machine to sleep at night. Says he recently started a low carb diet and watches his sugar intake to assist with his weight.    Limitations House hold activities;Walking;Standing;Lifting    How long can you stand comfortably? 10-15 minutes    How long can you walk comfortably? 5 minutes    Patient Stated Goals get more active, assist with weight loss goals    Currently in Pain? No/denies              [facility] PT Assessment - 04/01/20 0001      Assessment   Medical Diagnosis physical deconditioning    Referring Provider (PT) Gwinda Passe NP     Onset Date/Surgical Date --   Within the past year or so    Next MD Visit 08/25/20    Prior Therapy No       Precautions   Precautions None      Restrictions  Weight Bearing Restrictions No      Balance Screen   Has the patient fallen in the past 6 months No      Home Environment   Living Environment Private residence    Living Arrangements Non-relatives/Friends      Prior Function   Level of Independence Independent    Vocation --   Was a Retail buyer, 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, filed disability      Cognition   Overall Cognitive Status Within Functional Limits for tasks assessed      ROM / Strength   AROM / PROM / Strength Strength      Strength   Strength Assessment Site Hip;Knee;Ankle    Right/Left Hip Right;Left    Right Hip Flexion 4-/5    Right Hip Extension 4-/5    Right Hip ABduction 4-/5    Left Hip Flexion 4/5    Left Hip Extension 3+/5    Left Hip  ABduction 4/5    Right/Left Knee Right;Left    Right Knee Flexion 4/5    Right Knee Extension 4/5    Left Knee Flexion 4+/5    Left Knee Extension 4/5    Right/Left Ankle Right;Left    Right Ankle Dorsiflexion 4+/5    Left Ankle Dorsiflexion 4/5      6 Minute Walk- Baseline   6 Minute Walk- Baseline yes    02 Sat (%RA) 93 %      6 minute walk test results    Aerobic Endurance Distance Walked 1060    Endurance additional comments increased LT low back pain                       Objective measurements completed on examination: See above findings.               PT Education - 04/01/20 1705    Education Details on evaluation findings and POC    Person(s) Educated Patient    Methods Explanation    Comprehension Verbalized understanding            PT Short Term Goals - 04/01/20 1838      PT SHORT TERM GOAL #1   Title Patient will be independent with initial HEP and self-management strategies to improve functional outcomes    Time 2    Period Weeks    Status New    Target Date 04/18/20             PT Long Term Goals - 04/01/20 1840      PT LONG TERM GOAL #1   Title Patient will report at least 50% overall improvement in subjective complaint to indicate improvement in ability to perform ADLs.    Time 4    Period Weeks    Status New    Target Date 05/02/20      PT LONG TERM GOAL #2   Title Patient will have equal to or > 4+/5 MMT throughout BLE to improve ability to perform functional mobility, stair ambulation and ADLs.    Time 4    Period Weeks    Status New    Target Date 05/02/20      PT LONG TERM GOAL #3   Title Patient will be able to ambulate >1300 feet during 6 MWT to demo improved tolerance to functional activity and improve functional outcomes    Time 4    Period Weeks    Status New    Target Date 05/02/20  Plan - 04/01/20 1828    Clinical Impression Statement Patient is a 54 y.o. male who  presents to physical therapy with complaint of weakness, decreased functional tolerance. Patient demonstrates decreased strength, increased work of breathing with functional mobility and decreased overall tolerance to prolonged functional activity which are negatively impacting patient ability to perform ADLs. Patient will benefit from skilled physical therapy services to address these deficits to improve level of function with ADLs and functional mobility tasks.    Examination-Activity Limitations Stairs;Stand;Locomotion Level;Carry    Examination-Participation Restrictions Yard Work;Community Activity    Stability/Clinical Decision Making Stable/Uncomplicated    Clinical Decision Making Low    Rehab Potential Good    PT Frequency 2x / week    PT Duration 4 weeks    PT Treatment/Interventions ADLs/Self Care Home Management;Aquatic Therapy;Biofeedback;Cryotherapy;Electrical Stimulation;Contrast Bath;Fluidtherapy;Parrafin;Therapeutic activities;Therapeutic exercise;Orthotic Fit/Training;Patient/family education;Manual lymph drainage;Compression bandaging;Vasopneumatic Device;Taping;Splinting;Spinal Manipulations;Joint Manipulations;Energy conservation;Dry needling;Passive range of motion;Scar mobilization;Balance training;Neuromuscular re-education;DME Instruction;Gait training;Iontophoresis 4mg /ml Dexamethasone;Moist Heat;Stair training;Functional mobility training;Traction;Ultrasound;Cognitive remediation;Manual techniques    PT Next Visit Plan Review goals. Issue HEP. HEP to consist of bridge, ab set, SLR. Progress table core and hip strengthening to standing exercise. Progress to light circuit training when able    PT Home Exercise Plan Issue next visit    Consulted and Agree with Plan of Care Patient           Patient will benefit from skilled therapeutic intervention in order to improve the following deficits and impairments:  Improper body mechanics, Cardiopulmonary status limiting activity,  Decreased activity tolerance, Decreased endurance, Obesity, Decreased strength  Visit Diagnosis: Muscle weakness (generalized)     Problem List Patient Active Problem List   Diagnosis Date Noted  . Visual disturbance of one eye 02/25/2020  . Cerebrovascular accident ([place])   . Visual loss   . Educated about COVID-19 virus infection 01/27/2020  . Accelerating angina ([place]) 01/18/2020  . Abnormal stress test 01/18/2020  . OSA (obstructive sleep apnea) 12/26/2019  . Leg swelling 06/14/2017  . Hyperlipidemia 06/14/2017  . Coronary artery disease involving native coronary artery of native heart without angina pectoris 06/14/2017  . Medication management 06/14/2017  . Abnormal PFT 06/14/2017  . Prediabetes 02/10/2017  . NSTEMI (non-ST elevated myocardial infarction) ([place]) 02/03/2017  . Chest pain 02/03/2017  . Tobacco abuse 02/03/2017  . Obesity 02/03/2017  . Leukocytosis 02/03/2017  . Hypoxia 02/03/2017    6:46 PM, 04/01/20 Josue Hector PT DPT  Physical Therapist with [facility]  [phone] [place] [address] [place], [place], [address] Phone: [phone]   Fax:  [phone]  Name: ULYSEES ROBARTS MRN: [id number] Date of Birth: July 20, 1966

## 2020-04-02 ENCOUNTER — Ambulatory Visit: Payer: Medicaid Other | Admitting: Cardiology

## 2020-04-03 ENCOUNTER — Ambulatory Visit (INDEPENDENT_AMBULATORY_CARE_PROVIDER_SITE_OTHER): Payer: Medicaid Other | Admitting: Student

## 2020-04-03 ENCOUNTER — Other Ambulatory Visit: Payer: Self-pay

## 2020-04-03 ENCOUNTER — Encounter: Payer: Self-pay | Admitting: Student

## 2020-04-03 VITALS — BP 116/74 | HR 84 | Ht 72.0 in | Wt 325.0 lb

## 2020-04-03 DIAGNOSIS — I25118 Atherosclerotic heart disease of native coronary artery with other forms of angina pectoris: Secondary | ICD-10-CM | POA: Diagnosis not present

## 2020-04-03 DIAGNOSIS — E785 Hyperlipidemia, unspecified: Secondary | ICD-10-CM

## 2020-04-03 DIAGNOSIS — G4733 Obstructive sleep apnea (adult) (pediatric): Secondary | ICD-10-CM | POA: Diagnosis not present

## 2020-04-03 DIAGNOSIS — H539 Unspecified visual disturbance: Secondary | ICD-10-CM

## 2020-04-03 NOTE — Patient Instructions (Signed)
Medication Instructions:  Your physician recommends that you continue on your current medications as directed. Please refer to the Current Medication list given to you today.  *If you need a refill on your cardiac medications before your next appointment, please call your pharmacy*   Lab Work: NONE   If you have labs (blood work) drawn today and your tests are completely normal, you will receive your results only by: Marland Kitchen MyChart Message (if you have MyChart) OR . A paper copy in the mail If you have any lab test that is abnormal or we need to change your treatment, we will call you to review the results.   Testing/Procedures: NONE    Follow-Up: At [facility], you and your health needs are our priority.  As part of our continuing mission to provide you with exceptional heart care, we have created designated Provider Care Teams.  These Care Teams include your primary Cardiologist (physician) and Advanced Practice Providers (APPs -  Physician Assistants and Nurse Practitioners) who all work together to provide you with the care you need, when you need it.  We recommend signing up for the patient portal called "MyChart".  Sign up information is provided on this After Visit Summary.  MyChart is used to connect with patients for Virtual Visits (Telemedicine).  Patients are able to view lab/test results, encounter notes, upcoming appointments, etc.  Non-urgent messages can be sent to your provider as well.   To learn more about what you can do with MyChart, go to [email or web address].    Your next appointment:   3 month(s)  The format for your next appointment:   In Person  Provider:   Rollene Rotunda, MD   Other Instructions Thank you for choosing [facility]!

## 2020-04-03 NOTE — H&P (Signed)
[facility] History & Physical  Chad Splinter MD  Chad Gillespie 02/25/2020  Chief Complaint: [name] HPI: The patient is a 54 y.o. year-old with hx of MI/ CAD w/ hx stent , + tobacco use, COPD sent to ED from opthomalogist [name] office for new VF deficit. Pt has been experiencing L eye lower visual field deficits for around 2 wks.  Pt seen by [name] and sent to ED to be admitted for possible stroke/ CVA as cause of his VF deficit.  Asked to see for admission.   In ED CT showed no acute changes.   Pt seen in room, states about 2 wks ago he has noticed left eye visual defect in the lower quadrants, RLQ worse than LLQ.  Upper fields are unchanged w/ the left eye. The right eye is normal per the patient.  No arm or leg weakness, no eye or lip drooping or confusion.  No hx CVA.    Pt has known CAD , had nstemi in 2018 w/ 2 stents to RCA.  Had another Nstemi in April 2021 , LHC showed 1 of 2 stents was 100% occluded and there was another RCA lesion of 90% proximal to both stents. LCx disease had ^'d to 50-60% at the worse. No sig LAD disease. No PCI was done , plan was for medical Rx. Pt f/b Dr Percival Spanish in [place].   Pt has COPD, smokes 1/2 ppd , pre MI was smoking 4 ppd.  Not on home O2.  No hx intubation.   Pt is divorced, lives w/ a friend.  Worked as Engineer, building services until MI in 2018, refused for disability, has Medicaid now.     ROS  denies CP  no joint pain   no HA  no blurry vision  no rash  no diarrhea  no nausea/ vomiting  no dysuria  no difficulty voiding  no change in urine color   Past Medical History  Past Medical History:  Diagnosis Date  . CAD (coronary artery disease)    a. s/p NSTEMI in 01/2017 with DESx2 to RCA  . MI (myocardial infarction) ([place])   . Tobacco abuse    Past Surgical History  Past Surgical History:  Procedure Laterality Date  . CORONARY STENT INTERVENTION N/A 02/04/2017   Procedure: Coronary Stent Intervention;  Surgeon:  [name], MD;  Location: [place] CV LAB;  Service: Cardiovascular;  Laterality: N/A;  . ESOPHAGOGASTRODUODENOSCOPY (EGD) WITH ESOPHAGEAL DILATION    . LEFT HEART CATH AND CORONARY ANGIOGRAPHY N/A 02/04/2017   Procedure: Left Heart Cath and Coronary Angiography;  Surgeon: [name], MD;  Location: [place] CV LAB;  Service: Cardiovascular;  Laterality: N/A;  . LEFT HEART CATH AND CORONARY ANGIOGRAPHY N/A 01/18/2020   Procedure: LEFT HEART CATH AND CORONARY ANGIOGRAPHY;  Surgeon: [name], MD;  Location: [place] CV LAB;  Service: Cardiovascular;  Laterality: N/A;   Family History  Family History  Problem Relation Age of Onset  . Hypertension Mother   . Cancer Father   . Heart attack Father   . Diabetes Father   . Diabetes Sister   . Cancer Sister    Social History  reports that he has been smoking cigarettes. He started smoking about 34 years ago. He has been smoking about 0.10 packs per day. He has never used smokeless tobacco. He reports current alcohol use. He reports that he does not use drugs. Allergies  Allergies  Allergen Reactions  . Penicillins  Hives   Home medications Prior to Admission medications   Medication Sig Start Date End Date Taking? Authorizing Provider  albuterol (VENTOLIN HFA) 108 (90 Base) MCG/ACT inhaler INHALE 2 PUFFS INTO THE LUNGS EVERY 6 HOURS AS NEEDED FOR WHEEZING OR SHORTNESS OF BREATH Patient taking differently: Inhale 2 puffs into the lungs every 6 (six) hours as needed for wheezing or shortness of breath.  12/14/19  Yes Hoy Register, MD  aspirin 81 MG chewable tablet Chew 1 tablet (81 mg total) by mouth daily. 11/29/19  Yes Strader, Lennart Pall, PA-C  atorvastatin (LIPITOR) 80 MG tablet Take 1 tablet (80 mg total) by mouth daily at 6 PM. 11/29/19  Yes Strader, [place] M, PA-C  isosorbide mononitrate (IMDUR) 120 MG 24 hr tablet Take 1 tablet (120 mg total) by mouth daily. 01/28/20 01/27/21 Yes Hochrein, Fayrene Fearing, MD  nicotine  (NICODERM CQ) 14 mg/24hr patch Place 1 patch (14 mg total) onto the skin daily. 11/14/19  Yes [name], NP  nitroGLYCERIN (NITROSTAT) 0.4 MG SL tablet Place 1 tablet (0.4 mg total) under the tongue every 5 (five) minutes as needed for chest pain. 11/24/19  Yes [name], NP  Tiotropium Bromide Monohydrate (SPIRIVA RESPIMAT) 2.5 MCG/ACT AERS Inhale 2 puffs into the lungs daily. 12/26/19  Yes [name], MD  gabapentin (NEURONTIN) 100 MG capsule Take 1 capsule (100 mg total) by mouth 3 (three) times daily. 03/11/20   [name], NP  nicotine (NICODERM CQ) 21 mg/24hr patch Place 1 patch (21 mg total) onto the skin daily. 11/14/19   [name], NP  Tiotropium Bromide-Olodaterol (STIOLTO RESPIMAT) 2.5-2.5 MCG/ACT AERS Inhale 2 puffs into the lungs daily. 01/15/20   [name], MD       Exam Gen alert, no distress, obese, pleasant No rash, cyanosis or gangrene Sclera anicteric, throat clear  No jvd or bruits Chest clear bilat to bases  RRR no MRG Abd soft ntnd no mass or ascites +bs GU normal male MS no joint effusions or deformity Ext no LE or UE edema, no wounds or ulcers Neuro is alert, Ox 3 , nonfocal UE/ LE's are 5/5 x 4  no facial droop    Home meds:  - asa 81mg / lipitor 80/ imdur 120 qd/ sl ntg prn/ nicotine patch  - spiriva respimat 2 puffs qd/ albuterol HFA qid prn/ stiolto respimat 2 qd  - gabapentin 100 tid     Assessment/ Plan: 1. Visual field deficit - going on for 2 wks, L eye lower field deficits. Admit, rule out CVA. R eye not affected. Seen by optho today in office, recommended admit for r/o embolic CVA. No other gross neuro defect. No afib here. Plan CT / MRI/ MRA of head, carotid dopplers, echo.  DVT proph lovenox for now. Is already on ASA, will continue for now. Non-emergent neuro consult.  2. CAD - sp stents to RCA in 2018. Recent CP/ +stress test and repeat LHC showed worsening RCA/ LCx disease, no intervention done,  medical Rx.   3. COPD - not severe, smoker down to 1/2 ppd now 4. HL - cont meds     2019, MD   [facility] 02/25/2020, 4:29 PM

## 2020-04-03 NOTE — Progress Notes (Signed)
Cardiology Office Note    Date:  04/03/2020   ID:  Chad Gillespie, DOB Mar 02, 1966, MRN [id number]  PCP:  [name], NP  Cardiologist: Rollene Rotunda, MD    Chief Complaint  Patient presents with  . Follow-up    2 month visit    History of Present Illness:    Chad Gillespie is a 54 y.o. male with past medical history of CAD (s/p NSTEMI in 01/2017 with DESx2 to RCA, NST in 12/2019 showing peri-infarct ischemia and cath in 01/2020 showing CTO of mid-RCA and 50-60% LCx stenosis with medical management recommended and consideration of CTO to RCA if refractory symptoms), HLD,COPD,obesity and tobacco use  who presents to the office today for 75-month follow-up.  He was last examined by Dr. Antoine Poche on 01/28/2020 for follow-up from his recent cardiac catheterization. He was still having some intermittent chest discomfort which could occur at rest or with activity. Imdur was further titrated to 120 mg daily and repeat FLP was recommended given recent reinitiation of statin therapy.  In the interim, he was admitted to [facility] from 5/10 - 02/26/2020 for evaluation of loss of vision along the left lower half of his left eye for the past 2 weeks. CT Head and MRI Brain showed no acute intracranial abnormalities. It was recommended he take Plavix for 14 days then aspirin alone with outpatient Neurology and Ophthalmology follow-up recommended.  In talking with the patient today, he reports his breathing has overall been at baseline. His chest discomfort has improved since Imdur was titrated at the time of his last visit. He reports overall tolerating this well and denies any side effects. No recent orthopnea or lower extremity edema. He reports good compliance with CPAP at night. He is being followed by Ophthalmology for peripheral vision loss and has upcoming follow-up with Neurology. He did complete his 14 days of Plavix and is now back to taking ASA 81 mg daily.   Past Medical History:    Diagnosis Date  . CAD (coronary artery disease)    a. s/p NSTEMI in 01/2017 with DESx2 to RCA b. 01/2020: Cath showing CTO of mid-RCA and 50-60% LCx stenosis --> med management recommended.   Marland Kitchen COPD (chronic obstructive pulmonary disease) (HCC)   . Hyperlipidemia   . MI (myocardial infarction) (HCC)   . Tobacco abuse     Past Surgical History:  Procedure Laterality Date  . CORONARY STENT INTERVENTION N/A 02/04/2017   Procedure: Coronary Stent Intervention;  Surgeon: Peter M Swaziland, MD;  Location: [facility] INVASIVE CV LAB;  Service: Cardiovascular;  Laterality: N/A;  . ESOPHAGOGASTRODUODENOSCOPY (EGD) WITH ESOPHAGEAL DILATION    . LEFT HEART CATH AND CORONARY ANGIOGRAPHY N/A 02/04/2017   Procedure: Left Heart Cath and Coronary Angiography;  Surgeon: Peter M Swaziland, MD;  Location: [facility] INVASIVE CV LAB;  Service: Cardiovascular;  Laterality: N/A;  . LEFT HEART CATH AND CORONARY ANGIOGRAPHY N/A 01/18/2020   Procedure: LEFT HEART CATH AND CORONARY ANGIOGRAPHY;  Surgeon: Yvonne Kendall, MD;  Location: [facility] INVASIVE CV LAB;  Service: Cardiovascular;  Laterality: N/A;    Current Medications: Outpatient Medications Prior to Visit  Medication Sig Dispense Refill  . albuterol (VENTOLIN HFA) 108 (90 Base) MCG/ACT inhaler INHALE 2 PUFFS INTO THE LUNGS EVERY 6 HOURS AS NEEDED FOR WHEEZING OR SHORTNESS OF BREATH 6.7 g 1  . aspirin 81 MG chewable tablet Chew 1 tablet (81 mg total) by mouth daily. 30 tablet 11  . atorvastatin (LIPITOR) 80 MG tablet Take 1  tablet (80 mg total) by mouth daily at 6 PM. 30 tablet 11  . gabapentin (NEURONTIN) 100 MG capsule Take 1 capsule (100 mg total) by mouth 3 (three) times daily. 90 capsule 1  . isosorbide mononitrate (IMDUR) 120 MG 24 hr tablet Take 1 tablet (120 mg total) by mouth daily. 30 tablet 11  . nicotine (NICODERM CQ) 14 mg/24hr patch Place 1 patch (14 mg total) onto the skin daily. 28 patch 0  . nicotine (NICODERM CQ) 21 mg/24hr patch Place 1 patch (21 mg total) onto  the skin daily. 28 patch 0  . nitroGLYCERIN (NITROSTAT) 0.4 MG SL tablet Place 1 tablet (0.4 mg total) under the tongue every 5 (five) minutes as needed for chest pain. 50 tablet 3  . Tiotropium Bromide Monohydrate (SPIRIVA RESPIMAT) 2.5 MCG/ACT AERS Inhale 2 puffs into the lungs daily. 8 g 5  . Tiotropium Bromide-Olodaterol (STIOLTO RESPIMAT) 2.5-2.5 MCG/ACT AERS Inhale 2 puffs into the lungs daily. 4 g 6   No facility-administered medications prior to visit.     Allergies:   Penicillins   Social History   Socioeconomic History  . Marital status: Single    Spouse name: Not on file  . Number of children: Not on file  . Years of education: Not on file  . Highest education level: Not on file  Occupational History  . Not on file  Tobacco Use  . Smoking status: Current Every Day Smoker    Packs/day: 0.10    Types: Cigarettes    Start date: 04/11/1986  . Smokeless tobacco: Never Used  Vaping Use  . Vaping Use: Never used  Substance and Sexual Activity  . Alcohol use: Yes    Comment: occasionally   . Drug use: No  . Sexual activity: Not on file  Other Topics Concern  . Not on file  Social History Narrative  . Not on file   Social Determinants of Health   Financial Resource Strain:   . Difficulty of Paying Living Expenses:   Food Insecurity:   . Worried About Programme researcher, broadcasting/film/video in the Last Year:   . Barista in the Last Year:   Transportation Needs:   . Freight forwarder (Medical):   Marland Kitchen Lack of Transportation (Non-Medical):   Physical Activity:   . Days of Exercise per Week:   . Minutes of Exercise per Session:   Stress:   . Feeling of Stress :   Social Connections:   . Frequency of Communication with Friends and Family:   . Frequency of Social Gatherings with Friends and Family:   . Attends Religious Services:   . Active Member of Clubs or Organizations:   . Attends Banker Meetings:   Marland Kitchen Marital Status:      Family History:  The  patient's family history includes Cancer in his father and sister; Diabetes in his father and sister; Heart attack in his father; Hypertension in his mother.   Review of Systems:   Please see the history of present illness.     General:  No chills, fever, night sweats or weight changes.  Cardiovascular:  No dyspnea on exertion, edema, orthopnea, palpitations, paroxysmal nocturnal dyspnea. Positive for chest pain.  Dermatological: No rash, lesions/masses Respiratory: No cough, dyspnea Urologic: No hematuria, dysuria Abdominal:   No nausea, vomiting, diarrhea, bright red blood per rectum, melena, or hematemesis Neurologic:  No  wkns, changes in mental status. Positive for vision changes.   All other systems reviewed  and are otherwise negative except as noted above.   Physical Exam:    VS:  BP 116/74   Pulse 84   Ht 6' (1.829 m)   Wt (!) 325 lb (147.4 kg)   SpO2 91%   BMI 44.08 kg/m    General: Well developed, obese male appearing in no acute distress. Head: Normocephalic, atraumatic, sclera non-icteric.  Neck: No carotid bruits. JVD not elevated.  Lungs: Respirations regular and unlabored, without wheezes or rales.  Heart: Regular rate and rhythm. No S3 or S4.  No murmur, no rubs, or gallops appreciated. Abdomen: Soft, non-tender, non-distended. No obvious abdominal masses. Msk:  Strength and tone appear normal for age. No obvious joint deformities or effusions. Extremities: No clubbing or cyanosis. No lower extremity edema.  Distal pedal pulses are 2+ bilaterally. Neuro: Alert and oriented X 3. Moves all extremities spontaneously. No focal deficits noted. Psych:  Responds to questions appropriately with a normal affect. Skin: No rashes or lesions noted  Wt Readings from Last 3 Encounters:  04/03/20 (!) 325 lb (147.4 kg)  03/12/20 (!) 327 lb (148.3 kg)  02/25/20 (!) 332 lb 10.8 oz (150.9 kg)      Studies/Labs Reviewed:   EKG:  EKG is not ordered today.   Recent  Labs: 02/25/2020: ALT 29; BUN 11; Creatinine, Ser 0.91; Hemoglobin 15.3; Platelets 306; Potassium 3.8; Sodium 136   Lipid Panel    Component Value Date/Time   CHOL 126 02/26/2020 0544   CHOL 120 01/28/2020 1035   TRIG 101 02/26/2020 0544   HDL 24 (L) 02/26/2020 0544   HDL 27 (L) 01/28/2020 1035   CHOLHDL 5.3 02/26/2020 0544   VLDL 20 02/26/2020 0544   LDLCALC 82 02/26/2020 0544   LDLCALC 78 01/28/2020 1035    Additional studies/ records that were reviewed today include:   Cardiac Catheterization: 01/2020 Conclusions: 1. Two-vessel coronary artery disease with chronic total occlusion of the mid RCA and moderate disease (50-60%) involving the mid and distal LCx. 2. Mild luminal irregularities noted in the LAD. 3. Mildly to moderately reduced left ventricular systolic function (LVEF 40 to 10%) with basal and mid inferior hypokinesis/akinesis.  Recommendations: 1. Optimize antianginal therapy.  I will add isosorbide mononitrate 30 mg daily.  If the patient has refractory angina despite maximal tolerated doses of two antianginal treatments, PCI to mid/distal LCx and/or CTO of the RCA may need to be considered in the future. 2. Aggressive secondary prevention, including high intensity statin therapy, weight loss, and tobacco cessation.   Carotid Dopplers: 02/2020 IMPRESSION: No evidence of focal hemodynamically significant stenosis.  Echocardiogram: 02/2020 IMPRESSIONS    1. Left ventricular ejection fraction, by estimation, is 55%. The left  ventricle has low normal function. The left ventricle demonstrates  regional wall motion abnormalities (see scoring diagram/findings for  description). There is moderate concentric  left ventricular hypertrophy. Left ventricular diastolic parameters are  indeterminate.  2. Right ventricular systolic function is normal. The right ventricular  size is mildly enlarged.  3. Right atrial size was mildly dilated.  4. The mitral valve is  grossly normal. No evidence of mitral valve  regurgitation.  5. The aortic valve is tricuspid. Aortic valve regurgitation is not  visualized. No aortic stenosis is present.  6. The inferior vena cava is normal in size with greater than 50%  respiratory variability, suggesting right atrial pressure of 3 mmHg.   Assessment:    1. Coronary artery disease of native artery of native heart with stable angina pectoris (  [place])   2. Hyperlipidemia LDL goal <70   3. OSA (obstructive sleep apnea)   4. Visual disturbance of one eye      Plan:   In order of problems listed above:  1. CAD - He is s/p NSTEMI in 01/2017 with DESx2 to RCA and recent repeat cath in 01/2020 showed a CTO of the mid-RCA and 50-60% LCx stenosis with medical management recommended and consideration of CTO to RCA if refractory symptoms. - He reports his symptoms have improved since titration of Imdur at the time of his last visit. Will continue with current medical therapy including ASA 81mg  daily, Atorvastatin 80mg  daily and Imdur 120mg  daily. I encouraged him to make [place] aware if symptoms progress in severity as Imdur could be further titrated and we could consider the use of a cardioselective BB or Ranexa.   2. HLD - Recent FLP showed total cholesterol of 126, HDL 24, Triglycerides 101 and LDL 82. Still above goal of LDL less than 70. Will recheck FLP again in 2-3 months as he is making dietary changes and has reduced his intake of sweets and carbohydrates. If still above goal, consider adding Zetia to his regimen and could ultimately consider PSCK-9 inhibitor therapy.   3. OSA - Continued compliance with CPAP encouraged.  4. Visual Disturbance of Left Eye - He was recently evaluated for loss of vision along the left lower half of his left eye. Neurology work-up unrevealing during admission. Encouraged to keep scheduled follow-up with Neurology and Ophthalmology.     Medication Adjustments/Labs and Tests  Ordered: Current medicines are reviewed at length with the patient today.  Concerns regarding medicines are outlined above.  Medication changes, Labs and Tests ordered today are listed in the Patient Instructions below. Patient Instructions  Medication Instructions:  Your physician recommends that you continue on your current medications as directed. Please refer to the Current Medication list given to you today.  *If you need a refill on your cardiac medications before your next appointment, please call your pharmacy*   Lab Work: NONE   If you have labs (blood work) drawn today and your tests are completely normal, you will receive your results only by: Marland Kitchen MyChart Message (if you have MyChart) OR . A paper copy in the mail If you have any lab test that is abnormal or we need to change your treatment, we will call you to review the results.   Testing/Procedures: NONE    Follow-Up: At [facility], you and your health needs are our priority.  As part of our continuing mission to provide you with exceptional heart care, we have created designated Provider Care Teams.  These Care Teams include your primary Cardiologist (physician) and Advanced Practice Providers (APPs -  Physician Assistants and Nurse Practitioners) who all work together to provide you with the care you need, when you need it.  We recommend signing up for the patient portal called "MyChart".  Sign up information is provided on this After Visit Summary.  MyChart is used to connect with patients for Virtual Visits (Telemedicine).  Patients are able to view lab/test results, encounter notes, upcoming appointments, etc.  Non-urgent messages can be sent to your provider as well.   To learn more about what you can do with MyChart, go to [email or web address].    Your next appointment:   3 month(s)  The format for your next appointment:   In Person  Provider:   [name], MD   Other Instructions Thank you  for  choosing [facility]!       Signed, Ellsworth Lennox, PA-C  04/03/2020 7:40 PM    [facility] [address] [place], [place] [address] Phone: [phone] Fax: [phone]

## 2020-04-09 ENCOUNTER — Other Ambulatory Visit: Payer: Self-pay

## 2020-04-09 ENCOUNTER — Ambulatory Visit (HOSPITAL_COMMUNITY): Payer: Medicaid Other

## 2020-04-09 ENCOUNTER — Encounter (HOSPITAL_COMMUNITY): Payer: Self-pay

## 2020-04-09 DIAGNOSIS — M6281 Muscle weakness (generalized): Secondary | ICD-10-CM | POA: Diagnosis not present

## 2020-04-09 NOTE — Therapy (Signed)
[place], [place], [address] Phone: [phone]   Fax:  [phone]  Physical Therapy Treatment  Patient Details  Name: Chad Gillespie MRN: [id number] Date of Birth: 1965-12-30 Referring Provider (PT): Juluis Mire NP    Encounter Date: 04/09/2020   PT End of Session - 04/09/20 1540    Visit Number 2    Number of Visits 8    Date for PT Re-Evaluation 05/02/20    Authorization Type Medicaid (approved 3 vists)    Authorization Time Period 04/07/20-->04/20/20    Authorization - Visit Number 1    Authorization - Number of Visits 3    Progress Note Due on Visit 4    PT Start Time 2725    PT Stop Time 1616    PT Time Calculation (min) 38 min    Activity Tolerance Patient limited by fatigue;Patient tolerated treatment well    Behavior During Therapy [facility] for tasks assessed/performed           Past Medical History:  Diagnosis Date  . CAD (coronary artery disease)    a. s/p NSTEMI in 01/2017 with DESx2 to RCA b. 01/2020: Cath showing CTO of mid-RCA and 50-60% LCx stenosis --> med management recommended.   Marland Kitchen COPD (chronic obstructive pulmonary disease) ([place])   . Hyperlipidemia   . MI (myocardial infarction) ([place])   . Tobacco abuse     Past Surgical History:  Procedure Laterality Date  . CORONARY STENT INTERVENTION N/A 02/04/2017   Procedure: Coronary Stent Intervention;  Surgeon: [name], MD;  Location: [place] CV LAB;  Service: Cardiovascular;  Laterality: N/A;  . ESOPHAGOGASTRODUODENOSCOPY (EGD) WITH ESOPHAGEAL DILATION    . LEFT HEART CATH AND CORONARY ANGIOGRAPHY N/A 02/04/2017   Procedure: Left Heart Cath and Coronary Angiography;  Surgeon: [name], MD;  Location: [place] CV LAB;  Service: Cardiovascular;  Laterality: N/A;  . LEFT HEART CATH AND CORONARY ANGIOGRAPHY N/A 01/18/2020   Procedure: LEFT HEART CATH AND CORONARY ANGIOGRAPHY;  Surgeon: [name], MD;  Location: [place] CV LAB;  Service: Cardiovascular;  Laterality: N/A;    There were no vitals filed for this visit.   Subjective Assessment - 04/09/20 1540    Subjective Pt stated he is feeling good today, no reports of pain or recent fall.    Patient Stated Goals get more active, assist with weight loss goals    Currently in Pain? No/denies                             [facility] Adult PT Treatment/Exercise - 04/09/20 0001      Exercises   Exercises Knee/Hip      Knee/Hip Exercises: Standing   Heel Raises 10 reps    Hip Abduction Both;10 reps    Hip Extension Both;10 reps      Knee/Hip Exercises: Seated   Sit to Sand 10 reps;without UE support   eccentric control     Knee/Hip Exercises: Supine   Bridges 2 sets;10 reps    Straight Leg Raises 10 reps;Both    Other Supine Knee/Hip Exercises ab set paired with exhalation 10x 5"      Knee/Hip Exercises: Sidelying   Hip ABduction 10 reps                  PT Education - 04/09/20 1550    Education Details Reviewed goals, educated importance of HEP  complaince that was established this session.    Person(s) Educated Patient    Methods Explanation;Demonstration;Handout    Comprehension Verbalized understanding;Returned demonstration            PT Short Term Goals - 04/01/20 1838      PT SHORT TERM GOAL #1   Title Patient will be independent with initial HEP and self-management strategies to improve functional outcomes    Time 2    Period Weeks    Status New    Target Date 04/18/20             PT Long Term Goals - 04/01/20 1840      PT LONG TERM GOAL #1   Title Patient will report at least 50% overall improvement in subjective complaint to indicate improvement in ability to perform ADLs.    Time 4    Period Weeks    Status New    Target Date 05/02/20      PT LONG TERM GOAL #2   Title Patient will have equal to or > 4+/5 MMT throughout BLE to improve ability to perform functional mobility, stair  ambulation and ADLs.    Time 4    Period Weeks    Status New    Target Date 05/02/20      PT LONG TERM GOAL #3   Title Patient will be able to ambulate >1300 feet during 6 MWT to demo improved tolerance to functional activity and improve functional outcomes    Time 4    Period Weeks    Status New    Target Date 05/02/20                 Plan - 04/09/20 1602    Clinical Impression Statement Reviewed goals and educated importance of HEP compliance that was established this session.  Session focus with hip strengthening exercises.  Pt able to demonstrated appropraite mechanics with all exercises wtih no reports of pain.    Examination-Activity Limitations Stairs;Stand;Locomotion Level;Carry    Examination-Participation Restrictions Yard Work;Community Activity    Stability/Clinical Decision Making Stable/Uncomplicated    Clinical Decision Making Low    Rehab Potential Good    PT Frequency 2x / week    PT Duration 4 weeks    PT Treatment/Interventions ADLs/Self Care Home Management;Aquatic Therapy;Biofeedback;Cryotherapy;Electrical Stimulation;Contrast Bath;Fluidtherapy;Parrafin;Therapeutic activities;Therapeutic exercise;Orthotic Fit/Training;Patient/family education;Manual lymph drainage;Compression bandaging;Vasopneumatic Device;Taping;Splinting;Spinal Manipulations;Joint Manipulations;Energy conservation;Dry needling;Passive range of motion;Scar mobilization;Balance training;Neuromuscular re-education;DME Instruction;Gait training;Iontophoresis 4mg /ml Dexamethasone;Moist Heat;Stair training;Functional mobility training;Traction;Ultrasound;Cognitive remediation;Manual techniques    PT Next Visit Plan Next session add step ups, squats and tandem stance.  Progress to light circuit training when able    PT Home Exercise Plan 04/09/20: ab set, bridge, SLR, abd           Patient will benefit from skilled therapeutic intervention in order to improve the following deficits and  impairments:  Improper body mechanics, Cardiopulmonary status limiting activity, Decreased activity tolerance, Decreased endurance, Obesity, Decreased strength  Visit Diagnosis: Muscle weakness (generalized)     Problem List Patient Active Problem List   Diagnosis Date Noted  . Visual disturbance of one eye 02/25/2020  . Cerebrovascular accident (HCC)   . Visual loss   . Educated about COVID-19 virus infection 01/27/2020  . Accelerating angina (HCC) 01/18/2020  . Abnormal stress test 01/18/2020  . OSA (obstructive sleep apnea) 12/26/2019  . Leg swelling 06/14/2017  . Hyperlipidemia 06/14/2017  . Coronary artery disease involving native coronary artery of native heart without angina pectoris 06/14/2017  .  Medication management 06/14/2017  . Abnormal PFT 06/14/2017  . Prediabetes 02/10/2017  . NSTEMI (non-ST elevated myocardial infarction) (HCC) 02/03/2017  . Chest pain 02/03/2017  . Tobacco abuse 02/03/2017  . Obesity 02/03/2017  . Leukocytosis 02/03/2017  . Hypoxia 02/03/2017   Becky Sax, LPTA/CLT; CBIS [phone]  Juel Burrow 04/09/2020, 5:18 PM  [facility] [address] [place], [place], [address] Phone: [phone]   Fax:  [phone]  Name: Chad Gillespie MRN: [id number] Date of Birth: 23-Jun-1966

## 2020-04-09 NOTE — Patient Instructions (Signed)
Isometric Abdominal    Lying on back with knees bent, tighten stomach when exhalation. Hold 3-5 seconds. Repeat 10  times per set. Do 2 sets per day.   http://orth.exer.us/1086   Copyright  VHI. All rights reserved.   Bridging    Slowly raise buttocks from floor, keeping stomach tight. Repeat 10 times per set. Do 2 sets per day.   http://orth.exer.us/1096   Copyright  VHI. All rights reserved.   Straight Leg Raise    Tighten stomach and slowly raise locked right leg 12 inches from floor. Repeat 10 times per set. Do 2 sets per day.   http://orth.exer.us/1102   Copyright  VHI. All rights reserved.   Abduction: Side Leg Lift (Eccentric) - Side-Lying    Lie on side. Lift top leg slightly higher than shoulder level. Keep top leg straight with body, toes pointing forward. Slowly lower for 3-5 seconds. 10 reps per set, 2 sets per day, 4 days per week.  http://ecce.exer.us/62   Copyright  VHI. All rights reserved.

## 2020-04-11 ENCOUNTER — Other Ambulatory Visit: Payer: Self-pay

## 2020-04-11 ENCOUNTER — Encounter (HOSPITAL_COMMUNITY): Payer: Self-pay

## 2020-04-11 ENCOUNTER — Ambulatory Visit (HOSPITAL_COMMUNITY): Payer: Medicaid Other

## 2020-04-11 VITALS — HR 101

## 2020-04-11 DIAGNOSIS — M6281 Muscle weakness (generalized): Secondary | ICD-10-CM | POA: Diagnosis not present

## 2020-04-11 MED ORDER — ISOSORBIDE MONONITRATE ER 120 MG PO TB24: 120.0000 mg | ORAL_TABLET | Freq: Every day | ORAL | 3 refills | Status: DC

## 2020-04-11 NOTE — Telephone Encounter (Signed)
requested 90 day supply for imdur, done

## 2020-04-11 NOTE — Therapy (Signed)
[facility] [address] [place], [place], [address] Phone: [phone]   Fax:  [phone]  Physical Therapy Treatment  Patient Details  Name: Chad Gillespie MRN: [id number] Date of Birth: 1966-06-10 Referring Provider (PT): Gwinda Passe NP    Encounter Date: 04/11/2020   PT End of Session - 04/11/20 1533    Visit Number 3    Number of Visits 8    Date for PT Re-Evaluation 05/02/20    Authorization Type Medicaid (approved 3 vists)    Authorization Time Period 04/07/20-->04/20/20    Authorization - Visit Number 2    Authorization - Number of Visits 3    Progress Note Due on Visit 4    PT Start Time 1530    PT Stop Time 1612    PT Time Calculation (min) 42 min    Activity Tolerance Patient tolerated treatment well;No increased pain;Patient limited by fatigue    Behavior During Therapy [facility] for tasks assessed/performed           Past Medical History:  Diagnosis Date  . CAD (coronary artery disease)    a. s/p NSTEMI in 01/2017 with DESx2 to RCA b. 01/2020: Cath showing CTO of mid-RCA and 50-60% LCx stenosis --> med management recommended.   Marland Kitchen COPD (chronic obstructive pulmonary disease) (HCC)   . Hyperlipidemia   . MI (myocardial infarction) (HCC)   . Tobacco abuse     Past Surgical History:  Procedure Laterality Date  . CORONARY STENT INTERVENTION N/A 02/04/2017   Procedure: Coronary Stent Intervention;  Surgeon: Peter M Swaziland, MD;  Location: [facility] INVASIVE CV LAB;  Service: Cardiovascular;  Laterality: N/A;  . ESOPHAGOGASTRODUODENOSCOPY (EGD) WITH ESOPHAGEAL DILATION    . LEFT HEART CATH AND CORONARY ANGIOGRAPHY N/A 02/04/2017   Procedure: Left Heart Cath and Coronary Angiography;  Surgeon: Peter M Swaziland, MD;  Location: [facility] INVASIVE CV LAB;  Service: Cardiovascular;  Laterality: N/A;  . LEFT HEART CATH AND CORONARY ANGIOGRAPHY N/A 01/18/2020   Procedure: LEFT HEART CATH AND CORONARY ANGIOGRAPHY;  Surgeon: Yvonne Kendall, MD;   Location: [facility] INVASIVE CV LAB;  Service: Cardiovascular;  Laterality: N/A;    Vitals:   04/11/20 1529  Pulse: (!) 101  SpO2: 93%     Subjective Assessment - 04/11/20 1529    Subjective Pt stated he woke up with a little LBP, pain minimal maybe a 1/10 today.  Has began HEP    Patient Stated Goals get more active, assist with weight loss goals    Currently in Pain? Yes    Pain Score 1     Pain Location Back    Pain Orientation Lower    Pain Descriptors / Indicators Aching;Sore    Pain Type Acute pain    Pain Onset Today    Pain Frequency Intermittent    Aggravating Factors  slept wrong    Pain Relieving Factors unsure                             [facility] Adult PT Treatment/Exercise - 04/11/20 0001      Exercises   Exercises Knee/Hip      Knee/Hip Exercises: Standing   Heel Raises 10 reps    Heel Raises Limitations toe raises on incline slope    Forward Lunges 10 reps    Forward Lunges Limitations 6in step     Side Lunges 10 reps    Side Lunges Limitations 6 in step  Hip Abduction Both;10 reps    Hip Extension Both;10 reps    Lateral Step Up Both;10 reps;Hand Hold: 1;Step Height: 6"    Forward Step Up Both;10 reps;Hand Hold: 1;Step Height: 6"    Functional Squat 10 reps    Functional Squat Limitations front of chair for mechanics    SLS Lt 15", Rt 18" max of 3    Gait Training 378 ft    Other Standing Knee Exercises partial tandem stance 2x 30" solid surface      Knee/Hip Exercises: Seated   Sit to Sand 2 sets;10 reps;without UE support                  PT Education - 04/11/20 1557    Education Details Educated local class to assist with stopping smoking    Person(s) Educated Patient    Methods Explanation;Demonstration    Comprehension Verbalized understanding            PT Short Term Goals - 04/01/20 1838      PT SHORT TERM GOAL #1   Title Patient will be independent with initial HEP and self-management strategies to improve  functional outcomes    Time 2    Period Weeks    Status New    Target Date 04/18/20             PT Long Term Goals - 04/01/20 1840      PT LONG TERM GOAL #1   Title Patient will report at least 50% overall improvement in subjective complaint to indicate improvement in ability to perform ADLs.    Time 4    Period Weeks    Status New    Target Date 05/02/20      PT LONG TERM GOAL #2   Title Patient will have equal to or > 4+/5 MMT throughout BLE to improve ability to perform functional mobility, stair ambulation and ADLs.    Time 4    Period Weeks    Status New    Target Date 05/02/20      PT LONG TERM GOAL #3   Title Patient will be able to ambulate >1300 feet during 6 MWT to demo improved tolerance to functional activity and improve functional outcomes    Time 4    Period Weeks    Status New    Target Date 05/02/20                 Plan - 04/11/20 1606    Clinical Impression Statement Pt able to verbalize all current HEP, reports complaince daily.  Progressed functional strengthening for hip strenghtening.  Pt able to complete all exercises with proper form following initial instructions.  Pt educated on local class to assist with stopping smoking.    Examination-Activity Limitations Stairs;Stand;Locomotion Level;Carry    Examination-Participation Restrictions Yard Work;Community Activity    Stability/Clinical Decision Making Stable/Uncomplicated    Clinical Decision Making Low    Rehab Potential Good    PT Frequency 2x / week    PT Duration 4 weeks    PT Treatment/Interventions ADLs/Self Care Home Management;Aquatic Therapy;Biofeedback;Cryotherapy;Electrical Stimulation;Contrast Bath;Fluidtherapy;Parrafin;Therapeutic activities;Therapeutic exercise;Orthotic Fit/Training;Patient/family education;Manual lymph drainage;Compression bandaging;Vasopneumatic Device;Taping;Splinting;Spinal Manipulations;Joint Manipulations;Energy conservation;Dry needling;Passive range of  motion;Scar mobilization;Balance training;Neuromuscular re-education;DME Instruction;Gait training;Iontophoresis 4mg /ml Dexamethasone;Moist Heat;Stair training;Functional mobility training;Traction;Ultrasound;Cognitive remediation;Manual techniques    PT Next Visit Plan Review goals next session for Medicaid auth.  Pt reports [facility] insurance 04/17/20.  Next session add sidestep with theraband, vector stance.  Progress to light circuit training.  PT Home Exercise Plan 04/09/20: ab set, bridge, SLR, abd; 04/11/20: hip abd/ext, STS, tandem stance, encouraged to begin walking program           Patient will benefit from skilled therapeutic intervention in order to improve the following deficits and impairments:  Improper body mechanics, Cardiopulmonary status limiting activity, Decreased activity tolerance, Decreased endurance, Obesity, Decreased strength  Visit Diagnosis: Muscle weakness (generalized)     Problem List Patient Active Problem List   Diagnosis Date Noted  . Visual disturbance of one eye 02/25/2020  . Cerebrovascular accident ([place])   . Visual loss   . Educated about COVID-19 virus infection 01/27/2020  . Accelerating angina ([place]) 01/18/2020  . Abnormal stress test 01/18/2020  . OSA (obstructive sleep apnea) 12/26/2019  . Leg swelling 06/14/2017  . Hyperlipidemia 06/14/2017  . Coronary artery disease involving native coronary artery of native heart without angina pectoris 06/14/2017  . Medication management 06/14/2017  . Abnormal PFT 06/14/2017  . Prediabetes 02/10/2017  . NSTEMI (non-ST elevated myocardial infarction) ([place]) 02/03/2017  . Chest pain 02/03/2017  . Tobacco abuse 02/03/2017  . Obesity 02/03/2017  . Leukocytosis 02/03/2017  . Hypoxia 02/03/2017   Ihor Austin, LPTA/CLT; CBIS [phone]  Aldona Lento 04/11/2020, 4:59 PM  [place], [place], [address] Phone: [phone]   Fax:   [phone]  Name: RIDGE LAFOND MRN: [id number] Date of Birth: Nov 04, 1965

## 2020-04-11 NOTE — Patient Instructions (Signed)
Functional Quadriceps: Sit to Stand    Sit on edge of chair, feet flat on floor. Stand upright, extending knees fully. Repeat 10 times per set. Do 2 sets per day. http://orth.exer.us/734   Copyright  VHI. All rights reserved.   ABDUCTION: Standing (Active)    Stand, feet flat. Lift right leg out to side.  Complete 10 sets of 2 repetitions.  http://gtsc.exer.us/110   Copyright  VHI. All rights reserved.   Hip Extension (Standing)    Stand with support. Squeeze pelvic floor and hold. Move right leg backward with straight knee. Hold for 3 seconds. Repeat 10 times. Do 2 times a day. Repeat with other leg.  Copyright  VHI. All rights reserved.   Tandem Stance    Right foot in front of left, heel touching toe both feet "straight ahead". Stand on Foot Triangle of Support with both feet. Balance in this position ___ seconds. Do with left foot in front of right.  Copyright  VHI. All rights reserved.

## 2020-04-15 ENCOUNTER — Encounter (HOSPITAL_COMMUNITY): Payer: Self-pay

## 2020-04-15 ENCOUNTER — Ambulatory Visit (HOSPITAL_COMMUNITY): Payer: Medicaid Other

## 2020-04-15 ENCOUNTER — Other Ambulatory Visit: Payer: Self-pay

## 2020-04-15 DIAGNOSIS — M6281 Muscle weakness (generalized): Secondary | ICD-10-CM | POA: Diagnosis not present

## 2020-04-15 NOTE — Patient Instructions (Signed)
FUNCTIONAL MOBILITY: Squat    Stance: shoulder-width on floor. Bend hips and knees. Keep back straight. Do not allow knees to bend past toes. Squeeze glutes and quads to stand. 10 reps per set, 2 sets per day, 4 days per week  Copyright  VHI. All rights reserved.   Band Walk: Side Stepping    Tie band around legs, just above knees. Step 15 feet to one side, then step back to start. Repeat 15 feet per session. Note: Small towel between band and skin eases rubbing.  http://plyo.exer.us/76   Copyright  VHI. All rights reserved.

## 2020-04-15 NOTE — Therapy (Signed)
[facility] [address] [place], [place], [address] Phone: [phone]   Fax:  [phone]  Physical Therapy Treatment  Patient Details  Name: Chad Gillespie MRN: [id number] Date of Birth: [birth year] Referring Provider (PT): Gwinda Passe NP    Encounter Date: 04/15/2020   PT End of Session - 04/15/20 1545    Visit Number 4    Number of Visits 8    Date for PT Re-Evaluation 05/02/20    Authorization Type Medicaid (approved 3 vists)    Authorization Time Period 04/07/20-->04/20/20    Authorization - Visit Number 3    Authorization - Number of Visits 3    Progress Note Due on Visit 4    PT Start Time 1533    PT Stop Time 1614    PT Time Calculation (min) 41 min    Activity Tolerance Patient tolerated treatment well;No increased pain;Patient limited by fatigue    Behavior During Therapy [facility] for tasks assessed/performed           Past Medical History:  Diagnosis Date  . CAD (coronary artery disease)    a. s/p NSTEMI in 01/2017 with DESx2 to RCA b. 01/2020: Cath showing CTO of mid-RCA and 50-60% LCx stenosis --> med management recommended.   Marland Kitchen COPD (chronic obstructive pulmonary disease) (HCC)   . Hyperlipidemia   . MI (myocardial infarction) (HCC)   . Tobacco abuse     Past Surgical History:  Procedure Laterality Date  . CORONARY STENT INTERVENTION N/A 02/04/2017   Procedure: Coronary Stent Intervention;  Surgeon: Peter M Swaziland, MD;  Location: [facility] INVASIVE CV LAB;  Service: Cardiovascular;  Laterality: N/A;  . ESOPHAGOGASTRODUODENOSCOPY (EGD) WITH ESOPHAGEAL DILATION    . LEFT HEART CATH AND CORONARY ANGIOGRAPHY N/A 02/04/2017   Procedure: Left Heart Cath and Coronary Angiography;  Surgeon: Peter M Swaziland, MD;  Location: [facility] INVASIVE CV LAB;  Service: Cardiovascular;  Laterality: N/A;  . LEFT HEART CATH AND CORONARY ANGIOGRAPHY N/A 01/18/2020   Procedure: LEFT HEART CATH AND CORONARY ANGIOGRAPHY;  Surgeon: Yvonne Kendall, MD;   Location: [facility] INVASIVE CV LAB;  Service: Cardiovascular;  Laterality: N/A;    There were no vitals filed for this visit.   Subjective Assessment - 04/15/20 1544    Subjective Feeling good today, no reports of pain today.  Reports compliance wiht HEP daily.    Patient Stated Goals get more active, assist with weight loss goals    Currently in Pain? No/denies              [facility] PT Assessment - 04/15/20 0001      Assessment   Medical Diagnosis physical deconditioning    Referring Provider (PT) Gwinda Passe NP     Onset Date/Surgical Date --   Within the past year or so   Next MD Visit 08/25/20    Prior Therapy No       Strength   Right Hip Flexion 4+/5   was 4-   Right Hip Extension 4/5   was 4-   Right Hip ABduction 4/5   was 4-   Left Hip Flexion 4+/5   was 4/5   Left Hip Extension 4-/5   was 3+   Left Hip ABduction 4/5   was 4/5   Right/Left Knee Right;Left    Right Knee Flexion 4/5   was 4/5   Right Knee Extension 4+/5   was 4/5   Left Knee Flexion 4-/5   was 4+/5   Left  Knee Extension 4+/5   was 4/5   Right/Left Ankle Right;Left    Right Ankle Dorsiflexion 5/5   was 4+   Left Ankle Dorsiflexion 4+/5   was 4/5     6 minute walk test results    Aerobic Endurance Distance Walked 1248    Endurance additional comments , reports Lt hip pain following.                         [facility] Adult PT Treatment/Exercise - 04/15/20 0001      Exercises   Exercises Knee/Hip      Knee/Hip Exercises: Standing   Forward Lunges 15 reps    Forward Lunges Limitations 6in step     Side Lunges 10 reps    Side Lunges Limitations 6 in step    Functional Squat 10 reps    Functional Squat Limitations front of counter    Stairs 7in step height reciprocal pattern 1HRx 5 reps    Gait Training 1236ft during                    PT Short Term Goals - 04/15/20 1545      PT SHORT TERM GOAL #1   Title Patient will be independent with initial HEP and  self-management strategies to improve functional outcomes    Baseline 04/15/20:  Reports compliance wiht HEP daily.    Status Achieved             PT Long Term Goals - 04/15/20 1545      PT LONG TERM GOAL #1   Title Patient will report at least 50% overall improvement in subjective complaint to indicate improvement in ability to perform ADLs.    Baseline 04/15/20:  Reports improvements by 30%, limited by pain.  Reports Lt hip pain following .      PT LONG TERM GOAL #2   Title Patient will have equal to or > 4+/5 MMT throughout BLE to improve ability to perform functional mobility, stair ambulation and ADLs.      PT LONG TERM GOAL #3   Title Patient will be able to ambulate >1300 feet during 6 MWT to demo improved tolerance to functional activity and improve functional outcomes    Baseline 04/15/20: 1248 feet during                 Plan - 04/15/20 1605    Clinical Impression Statement Reviewed goals for Medicaid re-authorization.  Pt presents with vast improvement activity tolerance and hip strengthening is progressing.  Pt continues to exhibit weakness in hip musculature.  Reviewed HEP compliance with additional exercises added to weakness.    Examination-Activity Limitations Stairs;Stand;Locomotion Level;Carry    Examination-Participation Restrictions Yard Work;Community Activity    Stability/Clinical Decision Making Stable/Uncomplicated    Clinical Decision Making Low    Rehab Potential Good    PT Frequency 2x / week    PT Duration 4 weeks    PT Treatment/Interventions ADLs/Self Care Home Management;Aquatic Therapy;Biofeedback;Cryotherapy;Electrical Stimulation;Contrast Bath;Fluidtherapy;Parrafin;Therapeutic activities;Therapeutic exercise;Orthotic Fit/Training;Patient/family education;Manual lymph drainage;Compression bandaging;Vasopneumatic Device;Taping;Splinting;Spinal Manipulations;Joint Manipulations;Energy conservation;Dry needling;Passive range of motion;Scar  mobilization;Balance training;Neuromuscular re-education;DME Instruction;Gait training;Iontophoresis 4mg /ml Dexamethasone;Moist Heat;Stair training;Functional mobility training;Traction;Ultrasound;Cognitive remediation;Manual techniques    PT Next Visit Plan F/U with Medicaid/UHC authorizaton.  Progress hip strengthening and balalnce activities.  Progress to light circuit training.    PT Home Exercise Plan 04/09/20: ab set, bridge, SLR, abd; 04/11/20: hip abd/ext, STS, tandem stance, encouraged to begin walking program;  04/15/20: squat and sidestep wiht GTB           Patient will benefit from skilled therapeutic intervention in order to improve the following deficits and impairments:  Improper body mechanics, Cardiopulmonary status limiting activity, Decreased activity tolerance, Decreased endurance, Obesity, Decreased strength  Visit Diagnosis: Muscle weakness (generalized)     Problem List Patient Active Problem List   Diagnosis Date Noted  . Visual disturbance of one eye 02/25/2020  . Cerebrovascular accident (HCC)   . Visual loss   . Educated about COVID-19 virus infection 01/27/2020  . Accelerating angina (HCC) 01/18/2020  . Abnormal stress test 01/18/2020  . OSA (obstructive sleep apnea) 12/26/2019  . Leg swelling 06/14/2017  . Hyperlipidemia 06/14/2017  . Coronary artery disease involving native coronary artery of native heart without angina pectoris 06/14/2017  . Medication management 06/14/2017  . Abnormal PFT 06/14/2017  . Prediabetes 02/10/2017  . NSTEMI (non-ST elevated myocardial infarction) (HCC) 02/03/2017  . Chest pain 02/03/2017  . Tobacco abuse 02/03/2017  . Obesity 02/03/2017  . Leukocytosis 02/03/2017  . Hypoxia 02/03/2017    Juel Burrow 04/15/2020, 4:20 PM  [facility] [address] [place], [place], [address] Phone: [phone]   Fax:  [phone]  Name: VENIAMIN KINCAID MRN: [id number] Date of Birth:  Jul 19, 1966

## 2020-04-17 ENCOUNTER — Encounter (HOSPITAL_COMMUNITY): Payer: Medicaid Other | Admitting: Physical Therapy

## 2020-04-22 ENCOUNTER — Ambulatory Visit (INDEPENDENT_AMBULATORY_CARE_PROVIDER_SITE_OTHER): Payer: Medicaid Other | Admitting: Primary Care

## 2020-04-23 ENCOUNTER — Other Ambulatory Visit: Payer: Self-pay

## 2020-04-23 ENCOUNTER — Ambulatory Visit (HOSPITAL_COMMUNITY): Payer: Medicaid Other | Attending: Primary Care

## 2020-04-23 ENCOUNTER — Encounter (HOSPITAL_COMMUNITY): Payer: Self-pay

## 2020-04-23 DIAGNOSIS — M6281 Muscle weakness (generalized): Secondary | ICD-10-CM | POA: Insufficient documentation

## 2020-04-23 NOTE — Therapy (Signed)
[facility] [address] [place], [place], [address] Phone: [phone]   Fax:  [phone]  Physical Therapy Treatment  Patient Details  Name: Chad Gillespie MRN: [id number] Date of Birth: 12-26-1965 Referring Provider (PT): Gwinda Passe NP    Encounter Date: 04/23/2020   PT End of Session - 04/23/20 1555    Visit Number 5    Number of Visits 8    Date for PT Re-Evaluation 05/02/20    Authorization Type Medicaid (approved 4 vists)    Authorization Time Period 04/21/20-->05/06/20    Authorization - Visit Number 1    Authorization - Number of Visits 4    Progress Note Due on Visit 8    PT Start Time 1533    PT Stop Time 1616    PT Time Calculation (min) 43 min    Activity Tolerance Patient tolerated treatment well;No increased pain;Patient limited by fatigue    Behavior During Therapy [facility] for tasks assessed/performed           Past Medical History:  Diagnosis Date  . CAD (coronary artery disease)    a. s/p NSTEMI in 01/2017 with DESx2 to RCA b. 01/2020: Cath showing CTO of mid-RCA and 50-60% LCx stenosis --> med management recommended.   Marland Kitchen COPD (chronic obstructive pulmonary disease) (HCC)   . Hyperlipidemia   . MI (myocardial infarction) (HCC)   . Tobacco abuse     Past Surgical History:  Procedure Laterality Date  . CORONARY STENT INTERVENTION N/A 02/04/2017   Procedure: Coronary Stent Intervention;  Surgeon: Peter M Swaziland, MD;  Location: [facility] INVASIVE CV LAB;  Service: Cardiovascular;  Laterality: N/A;  . ESOPHAGOGASTRODUODENOSCOPY (EGD) WITH ESOPHAGEAL DILATION    . LEFT HEART CATH AND CORONARY ANGIOGRAPHY N/A 02/04/2017   Procedure: Left Heart Cath and Coronary Angiography;  Surgeon: Peter M Swaziland, MD;  Location: [facility] INVASIVE CV LAB;  Service: Cardiovascular;  Laterality: N/A;  . LEFT HEART CATH AND CORONARY ANGIOGRAPHY N/A 01/18/2020   Procedure: LEFT HEART CATH AND CORONARY ANGIOGRAPHY;  Surgeon: Yvonne Kendall, MD;   Location: [facility] INVASIVE CV LAB;  Service: Cardiovascular;  Laterality: N/A;    There were no vitals filed for this visit.   Subjective Assessment - 04/23/20 1605    Subjective Pt stated he is feeling good today, no reports of pain.  Has began walking program walking . out and back at home, able to do in 15-20 minutes.  Stated he has lost 19# in a month with change in diet and exercises.    Patient Stated Goals get more active, assist with weight loss goals    Currently in Pain? No/denies                             [facility] Adult PT Treatment/Exercise - 04/23/20 0001      Exercises   Exercises Knee/Hip      Knee/Hip Exercises: Aerobic   Nustep Circuit training intervals SPM >70x 3 sets      Knee/Hip Exercises: Machines for Strengthening   Cybex Leg Press 30# 10x 3 set in circuit training; cueing for slow control      Knee/Hip Exercises: Standing   Heel Raises 10 reps    Heel Raises Limitations circuit training 3sets    Forward Lunges 10 reps    Forward Lunges Limitations onto floor; circuit training 3RT    Side Lunges 10 reps    Side Lunges Limitations  onto floor; circuit training 3RT    Functional Squat 10 reps;3 sets    Functional Squat Limitations 3RT circuit training    Other Standing Knee Exercises Light circuit training: 3' on Nustep SPM>70, heel raises, squats, high marching, forward and lateral lunges, cybex leg press 3RT                    PT Short Term Goals - 04/15/20 1545      PT SHORT TERM GOAL #1   Title Patient will be independent with initial HEP and self-management strategies to improve functional outcomes    Baseline 04/15/20:  Reports compliance wiht HEP daily.    Status Achieved             PT Long Term Goals - 04/15/20 1545      PT LONG TERM GOAL #1   Title Patient will report at least 50% overall improvement in subjective complaint to indicate improvement in ability to perform ADLs.    Baseline 04/15/20:  Reports  improvements by 30%, limited by pain.  Reports Lt hip pain following .      PT LONG TERM GOAL #2   Title Patient will have equal to or > 4+/5 MMT throughout BLE to improve ability to perform functional mobility, stair ambulation and ADLs.      PT LONG TERM GOAL #3   Title Patient will be able to ambulate >1300 feet during 6 MWT to demo improved tolerance to functional activity and improve functional outcomes    Baseline 04/15/20: 1248 feet during                 Plan - 04/23/20 1555    Clinical Impression Statement Began circuit training for activity tolerance and functional strengthening.  Pt able to complete all exercises with good form with minimal cueing required.  Was limited by visual musculature fatigue with activities, no reports of pain today.    Examination-Activity Limitations Stairs;Stand;Locomotion Level;Carry    Examination-Participation Restrictions Yard Work;Community Activity    Stability/Clinical Decision Making Stable/Uncomplicated    Clinical Decision Making Low    Rehab Potential Good    PT Frequency 2x / week    PT Duration 4 weeks    PT Treatment/Interventions ADLs/Self Care Home Management;Aquatic Therapy;Biofeedback;Cryotherapy;Electrical Stimulation;Contrast Bath;Fluidtherapy;Parrafin;Therapeutic activities;Therapeutic exercise;Orthotic Fit/Training;Patient/family education;Manual lymph drainage;Compression bandaging;Vasopneumatic Device;Taping;Splinting;Spinal Manipulations;Joint Manipulations;Energy conservation;Dry needling;Passive range of motion;Scar mobilization;Balance training;Neuromuscular re-education;DME Instruction;Gait training;Iontophoresis 4mg /ml Dexamethasone;Moist Heat;Stair training;Functional mobility training;Traction;Ultrasound;Cognitive remediation;Manual techniques    PT Next Visit Plan Progress hip strengthening and balance activities (tandem on foam, vector stance, paloff).  Progress to light circuit training.    PT Home Exercise  Plan 04/09/20: ab set, bridge, SLR, abd; 04/11/20: hip abd/ext, STS, tandem stance, encouraged to begin walking program; 04/15/20: squat and sidestep wiht GTB           Patient will benefit from skilled therapeutic intervention in order to improve the following deficits and impairments:  Improper body mechanics, Cardiopulmonary status limiting activity, Decreased activity tolerance, Decreased endurance, Obesity, Decreased strength  Visit Diagnosis: Muscle weakness (generalized)     Problem List Patient Active Problem List   Diagnosis Date Noted  . Visual disturbance of one eye 02/25/2020  . Cerebrovascular accident (HCC)   . Visual loss   . Educated about COVID-19 virus infection 01/27/2020  . Accelerating angina (HCC) 01/18/2020  . Abnormal stress test 01/18/2020  . OSA (obstructive sleep apnea) 12/26/2019  . Leg swelling 06/14/2017  . Hyperlipidemia 06/14/2017  . Coronary artery  disease involving native coronary artery of native heart without angina pectoris 06/14/2017  . Medication management 06/14/2017  . Abnormal PFT 06/14/2017  . Prediabetes 02/10/2017  . NSTEMI (non-ST elevated myocardial infarction) (HCC) 02/03/2017  . Chest pain 02/03/2017  . Tobacco abuse 02/03/2017  . Obesity 02/03/2017  . Leukocytosis 02/03/2017  . Hypoxia 02/03/2017   Becky Sax, LPTA/CLT; CBIS [phone]  Juel Burrow 04/23/2020, 4:17 PM  [facility] [address] [place], [place], [address] Phone: [phone]   Fax:  [phone]  Name: Chad Gillespie MRN: [id number] Date of Birth: 1965-11-22

## 2020-04-25 ENCOUNTER — Other Ambulatory Visit: Payer: Self-pay

## 2020-04-25 ENCOUNTER — Encounter (HOSPITAL_COMMUNITY): Payer: Self-pay | Admitting: Physical Therapy

## 2020-04-25 ENCOUNTER — Ambulatory Visit (HOSPITAL_COMMUNITY): Payer: Medicaid Other | Admitting: Physical Therapy

## 2020-04-25 DIAGNOSIS — M6281 Muscle weakness (generalized): Secondary | ICD-10-CM

## 2020-04-25 NOTE — Therapy (Signed)
[facility] [address] [place], [place], [address] Phone: [phone]   Fax:  [phone]  Physical Therapy Treatment  Patient Details  Name: Chad Gillespie MRN: [id number] Date of Birth: 1966/04/27 Referring Provider (PT): Gwinda Passe NP    Encounter Date: 04/25/2020   PT End of Session - 04/25/20 1527    Visit Number 6    Number of Visits 8    Date for PT Re-Evaluation 05/02/20    Authorization Type Medicaid (approved 4 vists)    Authorization Time Period 04/21/20-->05/06/20    Authorization - Visit Number 2    Authorization - Number of Visits 4    Progress Note Due on Visit 8    PT Start Time 1521    PT Stop Time 1559    PT Time Calculation (min) 38 min    Activity Tolerance Patient tolerated treatment well;No increased pain;Patient limited by fatigue    Behavior During Therapy [facility] for tasks assessed/performed           Past Medical History:  Diagnosis Date  . CAD (coronary artery disease)    a. s/p NSTEMI in 01/2017 with DESx2 to RCA b. 01/2020: Cath showing CTO of mid-RCA and 50-60% LCx stenosis --> med management recommended.   Marland Kitchen COPD (chronic obstructive pulmonary disease) (HCC)   . Hyperlipidemia   . MI (myocardial infarction) (HCC)   . Tobacco abuse     Past Surgical History:  Procedure Laterality Date  . CORONARY STENT INTERVENTION N/A 02/04/2017   Procedure: Coronary Stent Intervention;  Surgeon: Peter M Swaziland, MD;  Location: [facility] INVASIVE CV LAB;  Service: Cardiovascular;  Laterality: N/A;  . ESOPHAGOGASTRODUODENOSCOPY (EGD) WITH ESOPHAGEAL DILATION    . LEFT HEART CATH AND CORONARY ANGIOGRAPHY N/A 02/04/2017   Procedure: Left Heart Cath and Coronary Angiography;  Surgeon: Peter M Swaziland, MD;  Location: [facility] INVASIVE CV LAB;  Service: Cardiovascular;  Laterality: N/A;  . LEFT HEART CATH AND CORONARY ANGIOGRAPHY N/A 01/18/2020   Procedure: LEFT HEART CATH AND CORONARY ANGIOGRAPHY;  Surgeon: Yvonne Kendall, MD;   Location: [facility] INVASIVE CV LAB;  Service: Cardiovascular;  Laterality: N/A;    There were no vitals filed for this visit.   Subjective Assessment - 04/25/20 1524    Subjective Patient reported that he is doing well today, denied any pain.    Patient Stated Goals get more active, assist with weight loss goals    Currently in Pain? No/denies                             [facility] Adult PT Treatment/Exercise - 04/25/20 0001      Knee/Hip Exercises: Machines for Strengthening   Cybex Leg Press 30# 10x 3 set      Knee/Hip Exercises: Standing   Heel Raises 10 reps    Heel Raises Limitations Squat to heel raise yellow weighted ball    Forward Lunges 10 reps    Side Lunges 10 reps    SLS with Vectors 5x Front, side, back 5'' holds    Other Standing Knee Exercises partial tandem stance 2x 30" solid surface and on foam    Other Standing Knee Exercises Standing reverse lunge x10. Side stepping with minisquat on blue line 15' x 3 RT      Knee/Hip Exercises: Seated   Marching Strengthening;1 set;20 reps    Marching Limitations Alternating lower extremities    Sit to Sand 15 reps;2 sets  yellow weighted ball     Knee/Hip Exercises: Prone   Other Prone Exercises Quadruped arm lifts x20 alternating and x20 LEs alternating cues for proper form                    PT Short Term Goals - 04/15/20 1545      PT SHORT TERM GOAL #1   Title Patient will be independent with initial HEP and self-management strategies to improve functional outcomes    Baseline 04/15/20:  Reports compliance wiht HEP daily.    Status Achieved             PT Long Term Goals - 04/15/20 1545      PT LONG TERM GOAL #1   Title Patient will report at least 50% overall improvement in subjective complaint to indicate improvement in ability to perform ADLs.    Baseline 04/15/20:  Reports improvements by 30%, limited by pain.  Reports Lt hip pain following .      PT LONG TERM GOAL #2   Title  Patient will have equal to or > 4+/5 MMT throughout BLE to improve ability to perform functional mobility, stair ambulation and ADLs.      PT LONG TERM GOAL #3   Title Patient will be able to ambulate >1300 feet during 6 MWT to demo improved tolerance to functional activity and improve functional outcomes    Baseline 04/15/20: 1248 feet during                 Plan - 04/25/20 1714    Clinical Impression Statement Continued with a focus on improving patient's activity tolerance as well as balance activities. Added quadruped exercises for core strengthening. Patient demonstrated good form with minimal cueing on quadruped exercises. Also added combination of squat to a heel raise with a weighted ball. The patient did demonstrate some wobbliness of the ankles with this. Patient did begin to lose form towards the end of the second trip with sidestepping minisquats so discontinued exercise.    Examination-Activity Limitations Stairs;Stand;Locomotion Level;Carry    Examination-Participation Restrictions Yard Work;Community Activity    Stability/Clinical Decision Making Stable/Uncomplicated    Rehab Potential Good    PT Frequency 2x / week    PT Duration 4 weeks    PT Treatment/Interventions ADLs/Self Care Home Management;Aquatic Therapy;Biofeedback;Cryotherapy;Electrical Stimulation;Contrast Bath;Fluidtherapy;Parrafin;Therapeutic activities;Therapeutic exercise;Orthotic Fit/Training;Patient/family education;Manual lymph drainage;Compression bandaging;Vasopneumatic Device;Taping;Splinting;Spinal Manipulations;Joint Manipulations;Energy conservation;Dry needling;Passive range of motion;Scar mobilization;Balance training;Neuromuscular re-education;DME Instruction;Gait training;Iontophoresis 4mg /ml Dexamethasone;Moist Heat;Stair training;Functional mobility training;Traction;Ultrasound;Cognitive remediation;Manual techniques    PT Next Visit Plan Progress hip strengthening and balance activities  (tandem on foam, vector stance, paloff).  Progress to light circuit training.    PT Home Exercise Plan 04/09/20: ab set, bridge, SLR, abd; 04/11/20: hip abd/ext, STS, tandem stance, encouraged to begin walking program; 04/15/20: squat and sidestep wiht GTB           Patient will benefit from skilled therapeutic intervention in order to improve the following deficits and impairments:  Improper body mechanics, Cardiopulmonary status limiting activity, Decreased activity tolerance, Decreased endurance, Obesity, Decreased strength  Visit Diagnosis: Muscle weakness (generalized)     Problem List Patient Active Problem List   Diagnosis Date Noted  . Visual disturbance of one eye 02/25/2020  . Cerebrovascular accident (HCC)   . Visual loss   . Educated about COVID-19 virus infection 01/27/2020  . Accelerating angina (HCC) 01/18/2020  . Abnormal stress test 01/18/2020  . OSA (obstructive sleep apnea) 12/26/2019  . Leg swelling 06/14/2017  .  Hyperlipidemia 06/14/2017  . Coronary artery disease involving native coronary artery of native heart without angina pectoris 06/14/2017  . Medication management 06/14/2017  . Abnormal PFT 06/14/2017  . Prediabetes 02/10/2017  . NSTEMI (non-ST elevated myocardial infarction) (HCC) 02/03/2017  . Chest pain 02/03/2017  . Tobacco abuse 02/03/2017  . Obesity 02/03/2017  . Leukocytosis 02/03/2017  . Hypoxia 02/03/2017   Verne Carrow PT, DPT 5:15 PM, 04/25/20 [id number]  [facility] [address] [place], [place], [address] Phone: [phone]   Fax:  [phone]  Name: Chad Gillespie MRN: [id number] Date of Birth: 1966-09-20

## 2020-04-29 ENCOUNTER — Ambulatory Visit (HOSPITAL_COMMUNITY): Payer: Medicaid Other

## 2020-04-29 ENCOUNTER — Other Ambulatory Visit: Payer: Self-pay

## 2020-04-29 ENCOUNTER — Encounter (HOSPITAL_COMMUNITY): Payer: Self-pay

## 2020-04-29 DIAGNOSIS — M6281 Muscle weakness (generalized): Secondary | ICD-10-CM | POA: Diagnosis not present

## 2020-04-29 NOTE — Patient Instructions (Signed)
Vector Stance:  Standing tall on one leg bring opposite leg forward, lateral and extend holding for 10" holds  Band Walk: Side Stepping    Tie band around legs, just above knees. Stand in "football positon" minisquat.  Step 20 feet to one side, then step back to start. Repeat 20 feet per session. Note: Small towel between band and skin eases rubbing.  http://plyo.exer.us/76   Copyright  VHI. All rights reserved.   Begin walking program increasing time/distance as able.  4 days a week.

## 2020-04-29 NOTE — Therapy (Addendum)
[facility] [address] [place], [place], [address] Phone: [phone]   Fax:  [phone]  Physical Therapy Treatment/ Discharge Summary  Patient Details  Name: Chad Gillespie MRN: [id number] Date of Birth: 15-Feb-1966 Referring Provider (PT): Juluis Mire NP    Encounter Date: 04/29/2020  PHYSICAL THERAPY DISCHARGE SUMMARY  Visits from Start of Care: 7  Current functional level related to goals / functional outcomes: See below    Remaining deficits: See below     Education / Equipment: See assessment  Plan: Patient agrees to discharge.  Patient goals were partially met. Patient is being discharged due to being pleased with the current functional level.  ?????       PT End of Session - 04/29/20 1603    Visit Number 7    Number of Visits 8    Date for PT Re-Evaluation 05/02/20    Authorization Type Medicaid (approved 4 vists)    Authorization Time Period 04/21/20-->05/06/20    Authorization - Visit Number 3    Authorization - Number of Visits 4    Progress Note Due on Visit 8    PT Start Time 7026    PT Stop Time 1603    PT Time Calculation (min) 40 min    Activity Tolerance Patient tolerated treatment well;No increased pain;Patient limited by fatigue    Behavior During Therapy [facility] for tasks assessed/performed           Past Medical History:  Diagnosis Date  . CAD (coronary artery disease)    a. s/p NSTEMI in 01/2017 with DESx2 to RCA b. 01/2020: Cath showing CTO of mid-RCA and 50-60% LCx stenosis --> med management recommended.   Marland Kitchen COPD (chronic obstructive pulmonary disease) ([place])   . Hyperlipidemia   . MI (myocardial infarction) ([place])   . Tobacco abuse     Past Surgical History:  Procedure Laterality Date  . CORONARY STENT INTERVENTION N/A 02/04/2017   Procedure: Coronary Stent Intervention;  Surgeon: [name], MD;  Location: [facility];  Service: Cardiovascular;  Laterality: N/A;  .  ESOPHAGOGASTRODUODENOSCOPY (EGD) WITH ESOPHAGEAL DILATION    . LEFT HEART CATH AND CORONARY ANGIOGRAPHY N/A 02/04/2017   Procedure: Left Heart Cath and Coronary Angiography;  Surgeon: [name], MD;  Location: [facility];  Service: Cardiovascular;  Laterality: N/A;  . LEFT HEART CATH AND CORONARY ANGIOGRAPHY N/A 01/18/2020   Procedure: LEFT HEART CATH AND CORONARY ANGIOGRAPHY;  Surgeon: [name], MD;  Location: [facility];  Service: Cardiovascular;  Laterality: N/A;    There were no vitals filed for this visit.   Subjective Assessment - 04/29/20 1530    Subjective Pt stated he is feeling good today.  Has been working on packing up and planing to move to [place] next week.  Reports he has been compliant with HEP daily and feels ready for discharge/    How long can you stand comfortably? 10-15 minutes    How long can you walk comfortably? Able to walk for 6-7 minutes fatigue.  Able to walk through grocery store pushing buggy for 20 minutes without difficulty  (was 5 minutes)    Patient Stated Goals get more active, assist with weight loss goals    Currently in Pain? No/denies              [facility] PT Assessment - 04/29/20 0001      Assessment   Medical Diagnosis physical deconditioning  Referring Provider (PT) Juluis Mire NP     Onset Date/Surgical Date --   Within the past year or so   Next MD Visit 08/25/20    Prior Therapy No       Strength   Right Hip Flexion 5/5   was 4+   Right Hip Extension 5/5   was 4   Right Hip ABduction 5/5   was 4   Left Hip Flexion 5/5   was 4+/5   Left Hip Extension 4/5   was 4-   Left Hip ABduction 4+/5   was 4/5   Right Knee Flexion 4+/5   was 4/5   Right Knee Extension 5/5   was 4+/5   Left Knee Flexion 4+/5   was 4-/5   Left Knee Extension 5/5   was 4+   Right Ankle Dorsiflexion 5/5   was 4+   Left Ankle Dorsiflexion 5/5   was 4/5     6 minute walk test results    Aerobic Endurance Distance Walked 1450    was 1248   Endurance additional comments 6MWT, no pain following                         [facility] Adult PT Treatment/Exercise - 04/29/20 0001      Exercises   Exercises Knee/Hip      Knee/Hip Exercises: Standing   Forward Lunges 10 reps    Forward Lunges Limitations on BOSU    Side Lunges 10 reps    Side Lunges Limitations on BOSU    Functional Squat 10 reps    SLS Lt 16", Rt 19"    SLS with Vectors 5x 10" holds on foam    Gait Training 1450 ft during 6MWT                    PT Short Term Goals - 04/29/20 1533      PT SHORT TERM GOAL #1   Title Patient will be independent with initial HEP and self-management strategies to improve functional outcomes    Baseline 04/15/20:  Reports compliance wiht HEP daily.    Status Achieved             PT Long Term Goals - 04/29/20 1533      PT LONG TERM GOAL #1   Title Patient will report at least 50% overall improvement in subjective complaint to indicate improvement in ability to perform ADLs.    Baseline 04/29/20:  Reports 40% improvements depending upon back pain.  04/15/20:  Reports improvements by 30%, limited by pain.  Reports Lt hip pain following 6MWT.    Status On-going      PT LONG TERM GOAL #2   Title Patient will have equal to or > 4+/5 MMT throughout BLE to improve ability to perform functional mobility, stair ambulation and ADLs.    Baseline 04/29/20: see MMT    Status Achieved      PT LONG TERM GOAL #3   Title Patient will be able to ambulate >1300 feet during 6 MWT to demo improved tolerance to functional activity and improve functional outcomes    Baseline 04/29/20: 1497f during 6MWT; 04/15/20: 1248 feet during 6MWT    Status Achieved                 Plan - 04/29/20 1610    Clinical Impression Statement Pt stated he has plans to move later this week and feels ready for  DC from [facility].  Reviewed goals with reports of HEP compliance 2x/ daily, MMT for all hip musculature at 4+/5. able to  ambulate 1450 ft during 6MWT.  Reports he feels 40% improvements, continues to c/o back and hip pain occassionaly. Reviewed current HEP with additional vector stance to address balance wiht printout given.    Examination-Activity Limitations Stairs;Stand;Locomotion Level;Carry    Examination-Participation Restrictions Yard Work;Community Activity    Stability/Clinical Decision Making Stable/Uncomplicated    Clinical Decision Making Low    Rehab Potential Good    PT Frequency 2x / week    PT Duration 4 weeks    PT Treatment/Interventions ADLs/Self Care Home Management;Aquatic Therapy;Biofeedback;Cryotherapy;Electrical Stimulation;Contrast Bath;Fluidtherapy;Parrafin;Therapeutic activities;Therapeutic exercise;Orthotic Fit/Training;Patient/family education;Manual lymph drainage;Compression bandaging;Vasopneumatic Device;Taping;Splinting;Spinal Manipulations;Joint Manipulations;Energy conservation;Dry needling;Passive range of motion;Scar mobilization;Balance training;Neuromuscular re-education;DME Instruction;Gait training;Iontophoresis 88m/ml Dexamethasone;Moist Heat;Stair training;Functional mobility training;Traction;Ultrasound;Cognitive remediation;Manual techniques    PT Next Visit Plan DC to HEP.    PT Home Exercise Plan 04/09/20: ab set, bridge, SLR, abd; 04/11/20: hip abd/ext, STS, tandem stance, encouraged to begin walking program; 04/15/20: squat and sidestep wiht GTB 7/13: vector stance           Patient will benefit from skilled therapeutic intervention in order to improve the following deficits and impairments:  Improper body mechanics, Cardiopulmonary status limiting activity, Decreased activity tolerance, Decreased endurance, Obesity, Decreased strength  Visit Diagnosis: Muscle weakness (generalized)     Problem List Patient Active Problem List   Diagnosis Date Noted  . Visual disturbance of one eye 02/25/2020  . Cerebrovascular accident ([facility]   . Visual loss   . Educated about  COVID-19 virus infection 01/27/2020  . Accelerating angina ([facility] 01/18/2020  . Abnormal stress test 01/18/2020  . OSA (obstructive sleep apnea) 12/26/2019  . Leg swelling 06/14/2017  . Hyperlipidemia 06/14/2017  . Coronary artery disease involving native coronary artery of native heart without angina pectoris 06/14/2017  . Medication management 06/14/2017  . Abnormal PFT 06/14/2017  . Prediabetes 02/10/2017  . NSTEMI (non-ST elevated myocardial infarction) ([facility] 02/03/2017  . Chest pain 02/03/2017  . Tobacco abuse 02/03/2017  . Obesity 02/03/2017  . Leukocytosis 02/03/2017  . Hypoxia 02/03/2017   CIhor Austin LPTA/CLT; CBIS [phone] CAldona Lento7/13/2021, 4:15 PM  9:29 AM, 04/30/20 CJosue HectorPT DPT  Physical Therapist [facility] [phone]SZ[address] [place] [address]Phone: [phone]  Fax:  [phone] Name: Chad CLECKLERMRN: [id number] of Birth: [birth year]

## 2020-05-01 ENCOUNTER — Encounter: Payer: Self-pay | Admitting: Pulmonary Disease

## 2020-05-01 ENCOUNTER — Encounter (HOSPITAL_COMMUNITY): Payer: Medicaid Other | Admitting: Physical Therapy

## 2020-05-01 ENCOUNTER — Ambulatory Visit (INDEPENDENT_AMBULATORY_CARE_PROVIDER_SITE_OTHER): Payer: Medicaid Other | Admitting: Pulmonary Disease

## 2020-05-01 ENCOUNTER — Other Ambulatory Visit: Payer: Self-pay

## 2020-05-01 DIAGNOSIS — G4733 Obstructive sleep apnea (adult) (pediatric): Secondary | ICD-10-CM | POA: Diagnosis not present

## 2020-05-01 DIAGNOSIS — Z720 Tobacco use: Secondary | ICD-10-CM | POA: Diagnosis not present

## 2020-05-01 MED ORDER — NICOTINE 10 MG IN INHA: 1.0000 | RESPIRATORY_TRACT | 2 refills | Status: AC | PRN

## 2020-05-01 NOTE — Addendum Note (Signed)
Addended by: Benjie Karvonen R on: 05/01/2020 12:54 PM   Modules accepted: Orders

## 2020-05-01 NOTE — Progress Notes (Signed)
   Subjective:    Patient ID: NIRANJAN RUFENER, male    DOB: [birth year], 54 y.o.   MRN: [id number]  HPI  54 yo smoker for FU of COPD & OSA  PMH -prediabetes, hyperlipidemia, hypertension  Chief Complaint  Patient presents with  . Follow-up    patient feels about the same as last visit. Still waking up at night but usually to go to bathroom. Shortness of breath with exerition.   Initial oV 12/2019  He obtained his CPAP in May.  Has been using this he states at least 4 hours, sleep is in short bursts of 2 to 3 hours.  He often naps in the daytime. Pressure is okay, he prefers nasal pillows or nasal mask because he did not want to cumbersome big mask on his face.  He prefers to watch TV & wears glasses during bedtime  Smokes 10 cigs/d Breathing ok - on spiriva   Significant tests/ events reviewed  NPSG 05/2017 >> weight 323 pounds-AHI 95/hour, corrected by CPAP of 15 cm full facemask.  05/2017 PFTs moderate restriction, ratio 74, FEV1 69%, FVC 72%, TLC 74% with DLCO 53% corrects to 76 for alveolar volume  Review of Systems Patient denies significant dyspnea,cough, hemoptysis,  chest pain, palpitations, pedal edema, orthopnea, paroxysmal nocturnal dyspnea, lightheadedness, nausea, vomiting, abdominal or  leg pains      Objective:   Physical Exam   Gen. Pleasant, obese, in no distress ENT - no lesions, no post nasal drip Neck: No JVD, no thyromegaly, no carotid bruits Lungs: no use of accessory muscles, no dullness to percussion, decreased without rales or rhonchi  Cardiovascular: Rhythm regular, heart sounds  normal, no murmurs or gallops, no peripheral edema Musculoskeletal: No deformities, no cyanosis or clubbing , no tremors        Assessment & Plan:

## 2020-05-01 NOTE — Assessment & Plan Note (Signed)
CPAP download confirms compliance of at least 4 hours or more every night.  CPAP is certainly effective has cut down AHI from 95-5 events an hour.  This is helped his daytime somnolence and fatigue He still has a mild leak so he can trial a chinstrap or use AirFit F30 full facemask instead since he prefers to watch TV during bedtime and this may be helpful  Weight loss encouraged, compliance with goal of at least 4-6 hrs every night is the expectation. Advised against medications with sedative side effects Cautioned against driving when sleepy - understanding that sleepiness will vary on a day to day basis

## 2020-05-01 NOTE — Assessment & Plan Note (Signed)
Smoking cessation again emphasized is the most important intervention.  He has tried nicotine patches and continues to smoke half pack per day.  We will provide him with Nicotrol inhaler cartridge and see if this is helpful for him to hold something in his hand

## 2020-05-01 NOTE — Patient Instructions (Signed)
Decrease auto CPAP 8-15 cm Trial of airfit F30 full face mask  Trial of nicotrol inhaler # 5

## 2020-05-02 ENCOUNTER — Telehealth: Payer: Self-pay | Admitting: Pulmonary Disease

## 2020-05-02 NOTE — Telephone Encounter (Signed)
Called pharmacy and talked to [facility] the pharmacist. Informed him that he could do 5 puffs/ cartridges a day per Dr. Vassie Loll. Per Lorin Picket depending on insurance purposes he may have to do 6 a day informed him that was fine. Called patient to let him know. Nothing further needed at this time.

## 2020-05-02 NOTE — Telephone Encounter (Signed)
Called and spoke with pt who stated he went to pharmacy to pick up the nicotrol inhaler but pharmacy stated to him that they needed more instructions before they would be able to provide pt with the Rx.  Called pharmacy to verify this and they stated that the pharmacist is needing to know more specific instructions (maximum number of puffs per day).  Dr. Vassie Loll, please advise.

## 2020-05-23 ENCOUNTER — Other Ambulatory Visit: Payer: Self-pay | Admitting: Family Medicine

## 2020-05-23 ENCOUNTER — Other Ambulatory Visit (INDEPENDENT_AMBULATORY_CARE_PROVIDER_SITE_OTHER): Payer: Self-pay | Admitting: Primary Care

## 2020-05-26 ENCOUNTER — Other Ambulatory Visit (INDEPENDENT_AMBULATORY_CARE_PROVIDER_SITE_OTHER): Payer: Self-pay | Admitting: Primary Care

## 2020-05-26 ENCOUNTER — Other Ambulatory Visit: Payer: Self-pay | Admitting: Family Medicine

## 2020-05-26 ENCOUNTER — Encounter (INDEPENDENT_AMBULATORY_CARE_PROVIDER_SITE_OTHER): Payer: Self-pay | Admitting: Primary Care

## 2020-05-26 MED ORDER — ALBUTEROL SULFATE HFA 108 (90 BASE) MCG/ACT IN AERS: INHALATION_SPRAY | RESPIRATORY_TRACT | 0 refills | Status: DC

## 2020-05-26 NOTE — Telephone Encounter (Signed)
Patient called regarding refill request. Refill was sent to [facility] in [place] but patient is requesting refill to be sent to [facility] due to being out of town taking care of his brother. Patient called [facility] in [place] and was notified by the pharmacy that a request would be sent to the office. Patient also requesting refill of Proair HFA to be sent to [facility] in [place] as well. Refills sent to requested pharmacy.

## 2020-05-28 ENCOUNTER — Other Ambulatory Visit (INDEPENDENT_AMBULATORY_CARE_PROVIDER_SITE_OTHER): Payer: Self-pay | Admitting: Primary Care

## 2020-05-28 NOTE — Telephone Encounter (Signed)
Please fill if appropriate.  

## 2020-05-29 MED ORDER — ALBUTEROL SULFATE HFA 108 (90 BASE) MCG/ACT IN AERS: 2.0000 | INHALATION_SPRAY | Freq: Four times a day (QID) | RESPIRATORY_TRACT | 2 refills | Status: DC | PRN

## 2020-05-30 NOTE — Telephone Encounter (Signed)
Patent requesting Dr. Vassie Loll fill out paperwork for his disability claim. He is seen for OSA and abnormal PFT. He will drop off the form next week.

## 2020-06-21 ENCOUNTER — Other Ambulatory Visit (INDEPENDENT_AMBULATORY_CARE_PROVIDER_SITE_OTHER): Payer: Self-pay | Admitting: Primary Care

## 2020-06-21 ENCOUNTER — Encounter (INDEPENDENT_AMBULATORY_CARE_PROVIDER_SITE_OTHER): Payer: Self-pay | Admitting: Primary Care

## 2020-06-24 ENCOUNTER — Other Ambulatory Visit (INDEPENDENT_AMBULATORY_CARE_PROVIDER_SITE_OTHER): Payer: Self-pay | Admitting: Primary Care

## 2020-06-24 MED ORDER — GABAPENTIN 100 MG PO CAPS: 100.0000 mg | ORAL_CAPSULE | Freq: Three times a day (TID) | ORAL | 3 refills | Status: DC

## 2020-06-24 NOTE — Telephone Encounter (Signed)
Refill if appropriate .

## 2020-07-01 MED ORDER — GABAPENTIN 300 MG PO CAPS: 300.0000 mg | ORAL_CAPSULE | Freq: Three times a day (TID) | ORAL | 1 refills | Status: DC

## 2020-07-04 ENCOUNTER — Ambulatory Visit: Payer: Medicaid Other | Admitting: Cardiology

## 2020-07-10 ENCOUNTER — Telehealth: Payer: Self-pay | Admitting: Pulmonary Disease

## 2020-07-10 ENCOUNTER — Telehealth: Payer: Self-pay | Admitting: Cardiology

## 2020-07-10 NOTE — Telephone Encounter (Signed)
Disability forms from Opt[facility] for [facility] received on 07/10/20. Completed patient authorization attached. Took forms to Dr. Jenene Slicker mailbox for completion. 07/10/20  fsw

## 2020-07-11 NOTE — Telephone Encounter (Signed)
Forms dropped off were not disability forms - it is Functional Capacity Questionnaire for his attorney. I will give forms to RA to review to see if he will complete.-pr

## 2020-07-11 NOTE — Telephone Encounter (Signed)
Rec'd completed forms back from Dr. Vassie Loll. Faxed to patient's attorney's office attention Felicia at [phone]. -pr

## 2020-08-05 ENCOUNTER — Other Ambulatory Visit: Payer: Self-pay | Admitting: Pulmonary Disease

## 2020-08-05 ENCOUNTER — Telehealth: Payer: Self-pay | Admitting: Cardiology

## 2020-08-05 ENCOUNTER — Other Ambulatory Visit (INDEPENDENT_AMBULATORY_CARE_PROVIDER_SITE_OTHER): Payer: Self-pay | Admitting: Primary Care

## 2020-08-05 NOTE — Telephone Encounter (Signed)
     I went in pt's chart to see who called him. 

## 2020-08-07 NOTE — Progress Notes (Signed)
Cardiology Office Note   Date:  08/08/2020   ID:  Chad Gillespie, DOB [birth year], MRN [id number]  PCP:  [name], NP  Cardiologist:   Rollene Rotunda, MD   Chief Complaint  Patient presents with  . Chest Pain      History of Present Illness: Chad Gillespie is a 54 y.o. male who presents for follow up of CAD. He had CAD (s/p NSTEMI in 01/2017 with DESx2 to RCA).  He had stopped his meds in 2018.  He had chest pain and had cath earlier this year.  He has an occluded RCA with nonobstructive disease in the circumflex.  He was managed medically.  He had chest pain recently and had a cardiac cath with results as below.  He was managed medically.  He was admitted to [facility] from 5/10 - 02/26/2020 for evaluation of loss of vision along the left lower half of his left eye for the past 2 weeks. CT Head and MRI Brain showed no acute intracranial abnormalities. It was recommended he take Plavix for 14 days then aspirin alone with outpatient Neurology and Ophthalmology follow-up recommended.  He has not seen neurology.  He does have an eye appointment apparently next week.  He says his vision is still off.  He has had some chest discomfort.  This is somewhat similar to what he had earlier this year when he had his cath.  He has to take nitroglycerin couple of times per week.  Sometimes he has to take 3 nitroglycerin.  The pain comes at rest.  It is in the center of his chest and left shoulder.  He thinks is a relatively stable pattern.  He is not having any new associated shortness of breath, PND or orthopnea.  He said no new palpitations, presyncope or syncope.  He has had a weight gain and increased lower extremity swelling which has been happening for several days.  He is not reporting any change in his diet and he says he does not use a lot of salt.    Past Medical History:  Diagnosis Date  . CAD (coronary artery disease)    a. s/p NSTEMI in 01/2017 with DESx2 to RCA b. 01/2020:  Cath showing CTO of mid-RCA and 50-60% LCx stenosis --> med management recommended.   Marland Kitchen COPD (chronic obstructive pulmonary disease) ([facility])   . Hyperlipidemia   . MI (myocardial infarction) ([facility])   . Tobacco abuse     Past Surgical History:  Procedure Laterality Date  . CORONARY STENT INTERVENTION N/A 02/04/2017   Procedure: Coronary Stent Intervention;  Surgeon: Peter M Swaziland, MD;  Location: [facility]AS[facility];  Service: Cardiovascular;  Laterality: N/A;  . ESOPHAGOGASTRODUODENOSCOPY (EGD) WITH ESOPHAGEAL DILATION    . LEFT HEART CATH AND CORONARY ANGIOGRAPHY N/A 02/04/2017   Procedure: Left Heart Cath and Coronary Angiography;  Surgeon: Peter M Swaziland, MD;  Location: [facility] INVASIVE [facility];  Service: Cardiovascular;  Laterality: N/A;  . LEFT HEART CATH AND CORONARY ANGIOGRAPHY N/A 01/18/2020   Procedure: LEFT HEART CATH AND CORONARY ANGIOGRAPHY;  Surgeon: Yvonne Kendall, MD;  Location: [facility]AS[facility];  Service: Cardiovascular;  Laterality: N/A;     Current Outpatient Medications  Medication Sig Dispense Refill  . albuterol (PROAIR HFA) 108 (90 Base) MCG/ACT inhaler Inhale 2 puffs into the lungs every 6 (six) hours as needed for wheezing or shortness of breath. 8.5 g 2  . aspirin 81 MG chewable tablet Chew 1 tablet (81  mg total) by mouth daily. 30 tablet 11  . gabapentin (NEURONTIN) 300 MG capsule TAKE 1 CAPSULE(300 MG) BY MOUTH THREE TIMES DAILY 90 capsule 1  . isosorbide mononitrate (IMDUR) 60 MG 24 hr tablet Take 3 tablets (180 mg total) by mouth daily. 90 tablet 3  . nicotine (NICOTROL) 10 MG inhaler Inhale 1 Cartridge (1 continuous puffing total) into the lungs as needed for smoking cessation. 42 each 2  . nitroGLYCERIN (NITROSTAT) 0.4 MG SL tablet Place 1 tablet (0.4 mg total) under the tongue every 5 (five) minutes as needed for chest pain. 50 tablet 3  . STIOLTO RESPIMAT 2.5-2.5 MCG/ACT AERS INHALE 2 PUFFS INTO THE LUNGS DAILY 4 g 6  . furosemide (LASIX) 20 MG tablet Take 2 tablets  (40 mg total) by mouth daily. 60 tablet 0  . potassium chloride SA (KLOR-CON) 20 MEQ tablet Take 1 tablet (20 mEq total) by mouth daily. 30 tablet 0  . rosuvastatin (CRESTOR) 40 MG tablet Take 1 tablet (40 mg total) by mouth daily. 30 tablet 3   No current facility-administered medications for this visit.    Allergies:   Penicillins    ROS:  Please see the history of present illness.   Otherwise, review of systems are positive for none.   All other systems are reviewed and negative.    PHYSICAL EXAM: VS:  BP 130/70   Pulse 96   Ht 6' (1.829 m)   Wt (!) 338 lb 12.8 oz (153.7 kg)   SpO2 91%   BMI 45.95 kg/m  , BMI Body mass index is 45.95 kg/m. GENERAL:  Well appearing NECK:  No jugular venous distention, waveform within normal limits, carotid upstroke brisk and symmetric, no bruits, no thyromegaly LUNGS:  Clear to auscultation bilaterally CHEST:  Unremarkable HEART:  PMI not displaced or sustained,S1 and S2 within normal limits, no S3, no S4, no clicks, no rubs, no murmurs ABD:  Flat, positive bowel sounds normal in frequency in pitch, no bruits, no rebound, no guarding, no midline pulsatile mass, no hepatomegaly, no splenomegaly EXT:  2 plus pulses throughout, moderate to the knees bilateral leg edema, no cyanosis no clubbing    EKG:  EKG is  ordered today. The ekg ordered today demonstrates sinus rhythm, rate 96, axis within normal limits, intervals within normal limits, old inferior infarct, no acute ST-T wave changes.   Recent Labs: 02/25/2020: ALT 29; BUN 11; Creatinine, Ser 0.91; Hemoglobin 15.3; Platelets 306; Potassium 3.8; Sodium 136    Lipid Panel    Component Value Date/Time   CHOL 126 02/26/2020 0544   CHOL 120 01/28/2020 1035   TRIG 101 02/26/2020 0544   HDL 24 (L) 02/26/2020 0544   HDL 27 (L) 01/28/2020 1035   CHOLHDL 5.3 02/26/2020 0544   VLDL 20 02/26/2020 0544   LDLCALC 82 02/26/2020 0544   LDLCALC 78 01/28/2020 1035      Wt Readings from Last 3  Encounters:  08/08/20 (!) 338 lb 12.8 oz (153.7 kg)  05/01/20 (!) 316 lb (143.3 kg)  04/03/20 (!) 325 lb (147.4 kg)    Diagnostic  Cath Dominance: Right    Other studies Reviewed: Additional studies/ records that were reviewed today include: Cath images reviewed in the room with the patient. Review of the above records demonstrates:  Please see elsewhere in the note.     ASSESSMENT AND PLAN:  CAD:   The patient does have an occluded RCA and has some relatively stable angina pattern.  I am going  to increase his Imdur to 180 mg daily.  If he has continued pain we could add Nexium will go to 240 mg of Imdur.  If he has continued or increased pain failing medications we could consider CTO of the right coronary but at this point I do not think he is in need of this.   HLD:   LDL is not quite at target at 82.  I will change him to Crestor 40 mg daily discontinuing the Lipitor.  He can get a lipid profile liver enzymes in about 10 weeks.  COPD:   He is followed by pulmonary.  OSA: He was to be followed by pulmonary for sleep apnea.    TOBACCO ABUSE:    We talked again about the need to quit smoking.  LEG SWELLING: I am going to give him Lasix 40 mg daily for 4 days and potassium 20 mEq daily for 4 days.  He will call us back if he has continued swelling and I might continue lower dose 20 Lasix and 10 potassium.  He was previously on diuretics but they were discontinued apparently for insurance reasons.  He might need some baseline diuretic.  COVID EDUCATION:   He wants to get the vaccine but there was a scheduling issue.  I discussed the importance of this particularly in his situation because he is high risk with his obesity and underlying illnesses.   Current medicines are reviewed at length with the patient today.  The patient does not have concerns regarding medicines.  The following changes have been made:  As above  Labs/ tests ordered today include:   Orders Placed This  Encounter  Procedures  . Lipid panel  . Hepatic function panel  . EKG 12-Lead     Disposition:   FU with Randall An PA in about two months.     Signed, Rollene Rotunda, MD  08/08/2020 9:36 AM    [facility]

## 2020-08-08 ENCOUNTER — Encounter (INDEPENDENT_AMBULATORY_CARE_PROVIDER_SITE_OTHER): Payer: Self-pay | Admitting: Primary Care

## 2020-08-08 ENCOUNTER — Other Ambulatory Visit: Payer: Self-pay

## 2020-08-08 ENCOUNTER — Ambulatory Visit: Payer: Medicaid Other | Admitting: Cardiology

## 2020-08-08 ENCOUNTER — Telehealth: Payer: Self-pay | Admitting: Cardiology

## 2020-08-08 ENCOUNTER — Ambulatory Visit (INDEPENDENT_AMBULATORY_CARE_PROVIDER_SITE_OTHER): Payer: Medicaid Other | Admitting: Primary Care

## 2020-08-08 ENCOUNTER — Encounter: Payer: Self-pay | Admitting: Cardiology

## 2020-08-08 VITALS — BP 130/70 | HR 96 | Ht 72.0 in | Wt 338.8 lb

## 2020-08-08 VITALS — BP 110/67 | HR 77 | Temp 97.7°F | Ht 72.0 in | Wt 338.0 lb

## 2020-08-08 DIAGNOSIS — Z1159 Encounter for screening for other viral diseases: Secondary | ICD-10-CM

## 2020-08-08 DIAGNOSIS — Z720 Tobacco use: Secondary | ICD-10-CM | POA: Diagnosis not present

## 2020-08-08 DIAGNOSIS — G4733 Obstructive sleep apnea (adult) (pediatric): Secondary | ICD-10-CM

## 2020-08-08 DIAGNOSIS — E785 Hyperlipidemia, unspecified: Secondary | ICD-10-CM | POA: Diagnosis not present

## 2020-08-08 DIAGNOSIS — R7303 Prediabetes: Secondary | ICD-10-CM

## 2020-08-08 DIAGNOSIS — I25110 Atherosclerotic heart disease of native coronary artery with unstable angina pectoris: Secondary | ICD-10-CM | POA: Diagnosis not present

## 2020-08-08 DIAGNOSIS — F32A Depression, unspecified: Secondary | ICD-10-CM | POA: Diagnosis not present

## 2020-08-08 DIAGNOSIS — Z1211 Encounter for screening for malignant neoplasm of colon: Secondary | ICD-10-CM | POA: Diagnosis not present

## 2020-08-08 LAB — POCT GLYCOSYLATED HEMOGLOBIN (HGB A1C): Hemoglobin A1C: 6.1 % — AB (ref 4.0–5.6)

## 2020-08-08 LAB — GLUCOSE, POCT (MANUAL RESULT ENTRY): POC Glucose: 111 mg/dl — AB (ref 70–99)

## 2020-08-08 MED ORDER — ESCITALOPRAM OXALATE 10 MG PO TABS: 10.0000 mg | ORAL_TABLET | Freq: Every day | ORAL | 1 refills | Status: DC

## 2020-08-08 MED ORDER — ROSUVASTATIN CALCIUM 40 MG PO TABS
40.0000 mg | ORAL_TABLET | Freq: Every day | ORAL | 3 refills | Status: DC
Start: 2020-08-08 — End: 2020-12-04

## 2020-08-08 MED ORDER — FUROSEMIDE 20 MG PO TABS
40.0000 mg | ORAL_TABLET | Freq: Every day | ORAL | 0 refills | Status: DC
Start: 2020-08-08 — End: 2020-09-01

## 2020-08-08 MED ORDER — POTASSIUM CHLORIDE CRYS ER 20 MEQ PO TBCR
20.0000 meq | EXTENDED_RELEASE_TABLET | Freq: Every day | ORAL | 0 refills | Status: DC
Start: 2020-08-08 — End: 2020-09-01

## 2020-08-08 MED ORDER — ISOSORBIDE MONONITRATE ER 60 MG PO TB24: 180.0000 mg | ORAL_TABLET | Freq: Every day | ORAL | 3 refills | Status: DC

## 2020-08-08 NOTE — Patient Instructions (Signed)
Depression can affect thoughts, feelings & sleep. Can lead to changes in mood ,relationships, daily activities & physical health, caused by interference in brain chemicals.

## 2020-08-08 NOTE — Patient Instructions (Addendum)
Medication Instructions:  STOP the Atorvastatin START Rosuvastatin 40 mg once daily INCREASE the Imdur to 180 mg once daily (3 of the 60 mg tablets) TAKE Furosemide 40 mg (2 of the 20 mg tablets) for 4 days only TAKE Potassium 20 mEq for 4 days only with the Furosemide   *If you need a refill on your cardiac medications before your next appointment, please call your pharmacy*   Lab Work: Your provider would like for you to return in 2 months to have the following labs drawn: Fasting Lipid and Liver. You do not need an appointment for the lab. Once in our office lobby there is a podium where you can sign in and ring the doorbell to alert [place] that you are here. The lab is open from 8:00 am to 4:30 pm; closed for lunch from 12:45pm-1:45pm.  If you have labs (blood work) drawn today and your tests are completely normal, you will receive your results only by: Marland Kitchen MyChart Message (if you have MyChart) OR . A paper copy in the mail If you have any lab test that is abnormal or we need to change your treatment, we will call you to review the results.   Testing/Procedures: None ordered   Follow-Up: At [facility], you and your health needs are our priority.  As part of our continuing mission to provide you with exceptional heart care, we have created designated Provider Care Teams.  These Care Teams include your primary Cardiologist (physician) and Advanced Practice Providers (APPs -  Physician Assistants and Nurse Practitioners) who all work together to provide you with the care you need, when you need it.  We recommend signing up for the patient portal called "MyChart".  Sign up information is provided on this After Visit Summary.  MyChart is used to connect with patients for Virtual Visits (Telemedicine).  Patients are able to view lab/test results, encounter notes, upcoming appointments, etc.  Non-urgent messages can be sent to your provider as well.   To learn more about what you can do with  MyChart, go to [email or web address].    Your next appointment:   10 week(s)  The format for your next appointment:   In Person  Provider:   Randall An, PA   Other Instructions Call the office at [phone] if the swelling does not decrease after the 4 days

## 2020-08-08 NOTE — Telephone Encounter (Signed)
Pt c/o medication issue:  1. Name of Medication: isosorbide mononitrate (IMDUR) 60 MG 24 hr tablet  2. How are you currently taking this medication (dosage and times per day)? 1 tablet 3 times a day  3. Are you having a reaction (difficulty breathing--STAT)? no  4. What is your medication issue? Patient states he was taking one 120 mg tablet of the medication, but it was changed to 3 tablets of 60 mg. He states he just got a bottle of the 120 mg tablets refilled and would like to know if he should finish the bottle up before switching to the 60 mg tablets. He states to call him back after lunch.

## 2020-08-08 NOTE — Telephone Encounter (Signed)
The patient has been made aware that he can take one of his 120 mg and then a 60 mg to equal the 180 mg so he doesn't waste any pills.

## 2020-08-08 NOTE — Progress Notes (Signed)
Established Patient Office Visit  Subjective:  Patient ID: Chad Gillespie, male    DOB: 02/04/1966  Age: 54 y.o. MRN: [id number]  CC:  Chief Complaint  Patient presents with  . Hypertension    Pt. is here for blood pressure follow up.    HPI Mr. Chad Gillespie is a 54 year old obese male who presents for management of hypertension. Denies shortness of breath, headaches, chest pain or lower extremity edema.  Blood pressure is unremarkable 110/67.  He is concerned with feeling down and depressed because he is unable to do the things he was previous we able to do prior to his CVA.  Feels like nothing to do sit around the house and eat, think about the past and not much interested in the future.  Past Medical History:  Diagnosis Date  . CAD (coronary artery disease)    a. s/p NSTEMI in 01/2017 with DESx2 to RCA b. 01/2020: Cath showing CTO of mid-RCA and 50-60% LCx stenosis --> med management recommended.   Marland Kitchen COPD (chronic obstructive pulmonary disease) (HCC)   . Hyperlipidemia   . MI (myocardial infarction) (HCC)   . Tobacco abuse     Past Surgical History:  Procedure Laterality Date  . CORONARY STENT INTERVENTION N/A 02/04/2017   Procedure: Coronary Stent Intervention;  Surgeon: Peter M Swaziland, MD;  Location: [facility] INVASIVE CV LAB;  Service: Cardiovascular;  Laterality: N/A;  . ESOPHAGOGASTRODUODENOSCOPY (EGD) WITH ESOPHAGEAL DILATION    . LEFT HEART CATH AND CORONARY ANGIOGRAPHY N/A 02/04/2017   Procedure: Left Heart Cath and Coronary Angiography;  Surgeon: Peter M Swaziland, MD;  Location: [facility] INVASIVE CV LAB;  Service: Cardiovascular;  Laterality: N/A;  . LEFT HEART CATH AND CORONARY ANGIOGRAPHY N/A 01/18/2020   Procedure: LEFT HEART CATH AND CORONARY ANGIOGRAPHY;  Surgeon: Yvonne Kendall, MD;  Location: [facility] INVASIVE CV LAB;  Service: Cardiovascular;  Laterality: N/A;    Family History  Problem Relation Age of Onset  . Hypertension Mother   . Cancer Father   . Heart attack Father    . Diabetes Father   . Diabetes Sister   . Cancer Sister     Social History   Socioeconomic History  . Marital status: Single    Spouse name: Not on file  . Number of children: Not on file  . Years of education: Not on file  . Highest education level: Not on file  Occupational History  . Not on file  Tobacco Use  . Smoking status: Current Every Day Smoker    Packs/day: 0.50    Types: Cigarettes    Start date: 04/11/1986  . Smokeless tobacco: Never Used  . Tobacco comment: 10 a day  Vaping Use  . Vaping Use: Never used  Substance and Sexual Activity  . Alcohol use: Yes    Comment: occasionally   . Drug use: No  . Sexual activity: Not on file  Other Topics Concern  . Not on file  Social History Narrative  . Not on file   Social Determinants of Health   Financial Resource Strain:   . Difficulty of Paying Living Expenses: Not on file  Food Insecurity:   . Worried About Programme researcher, broadcasting/film/video in the Last Year: Not on file  . Ran Out of Food in the Last Year: Not on file  Transportation Needs:   . Lack of Transportation (Medical): Not on file  . Lack of Transportation (Non-Medical): Not on file  Physical Activity:   . Days of  Exercise per Week: Not on file  . Minutes of Exercise per Session: Not on file  Stress:   . Feeling of Stress : Not on file  Social Connections:   . Frequency of Communication with Friends and Family: Not on file  . Frequency of Social Gatherings with Friends and Family: Not on file  . Attends Religious Services: Not on file  . Active Member of Clubs or Organizations: Not on file  . Attends Banker Meetings: Not on file  . Marital Status: Not on file  Intimate Partner Violence:   . Fear of Current or Ex-Partner: Not on file  . Emotionally Abused: Not on file  . Physically Abused: Not on file  . Sexually Abused: Not on file    Outpatient Medications Prior to Visit  Medication Sig Dispense Refill  . albuterol (PROAIR HFA) 108  (90 Base) MCG/ACT inhaler Inhale 2 puffs into the lungs every 6 (six) hours as needed for wheezing or shortness of breath. 8.5 g 2  . aspirin 81 MG chewable tablet Chew 1 tablet (81 mg total) by mouth daily. 30 tablet 11  . furosemide (LASIX) 20 MG tablet Take 2 tablets (40 mg total) by mouth daily. 60 tablet 0  . gabapentin (NEURONTIN) 300 MG capsule TAKE 1 CAPSULE(300 MG) BY MOUTH THREE TIMES DAILY 90 capsule 1  . isosorbide mononitrate (IMDUR) 60 MG 24 hr tablet Take 3 tablets (180 mg total) by mouth daily. 90 tablet 3  . nicotine (NICOTROL) 10 MG inhaler Inhale 1 Cartridge (1 continuous puffing total) into the lungs as needed for smoking cessation. 42 each 2  . nitroGLYCERIN (NITROSTAT) 0.4 MG SL tablet Place 1 tablet (0.4 mg total) under the tongue every 5 (five) minutes as needed for chest pain. 50 tablet 3  . potassium chloride SA (KLOR-CON) 20 MEQ tablet Take 1 tablet (20 mEq total) by mouth daily. 30 tablet 0  . rosuvastatin (CRESTOR) 40 MG tablet Take 1 tablet (40 mg total) by mouth daily. 30 tablet 3  . STIOLTO RESPIMAT 2.5-2.5 MCG/ACT AERS INHALE 2 PUFFS INTO THE LUNGS DAILY 4 g 6   No facility-administered medications prior to visit.    Allergies  Allergen Reactions  . Penicillins Hives    ROS Review of Systems  Constitutional: Positive for activity change.  Psychiatric/Behavioral:       Depression  All other systems reviewed and are negative.     Objective:    Physical Exam Constitutional:      Appearance: He is obese.  HENT:     Nose: Nose normal.  Cardiovascular:     Rate and Rhythm: Normal rate and regular rhythm.  Pulmonary:     Effort: Pulmonary effort is normal.     Breath sounds: Normal breath sounds.  Abdominal:     General: Bowel sounds are normal. There is distension.     Palpations: Abdomen is soft.  Musculoskeletal:        General: Normal range of motion.     Cervical back: Normal range of motion and neck supple.  Skin:    General: Skin is warm  and dry.  Neurological:     Mental Status: He is alert and oriented to person, place, and time.  Psychiatric:     Comments: Depression     BP 110/67 (BP Location: Left Arm, Patient Position: Sitting, Cuff Size: Large)   Pulse 77   Temp 97.7 F (36.5 C) (Temporal)   Ht 6' (1.829 m)   [place] Marland Kitchen)  338 lb (153.3 kg)   SpO2 95%   BMI 45.84 kg/m  Wt Readings from Last 3 Encounters:  08/08/20 (!) 338 lb (153.3 kg)  08/08/20 (!) 338 lb 12.8 oz (153.7 kg)  05/01/20 (!) 316 lb (143.3 kg)     Health Maintenance Due  Topic Date Due  . COVID-19 Vaccine (1) Never done  . COLONOSCOPY  Never done    There are no preventive care reminders to display for this patient.  Lab Results  Component Value Date   TSH 1.540 03/22/2017   Lab Results  Component Value Date   WBC 9.1 02/25/2020   HGB 15.3 02/25/2020   HCT 46.6 02/25/2020   MCV 90.5 02/25/2020   PLT 306 02/25/2020   Lab Results  Component Value Date   NA 136 02/25/2020   K 3.8 02/25/2020   CO2 27 02/25/2020   GLUCOSE 91 02/25/2020   BUN 11 02/25/2020   CREATININE 0.91 02/25/2020   BILITOT 0.6 02/25/2020   ALKPHOS 111 02/25/2020   AST 20 02/25/2020   ALT 29 02/25/2020   PROT 7.3 02/25/2020   ALBUMIN 3.7 02/25/2020   CALCIUM 9.2 02/25/2020   ANIONGAP 8 02/25/2020   Lab Results  Component Value Date   CHOL 126 02/26/2020   Lab Results  Component Value Date   HDL 24 (L) 02/26/2020   Lab Results  Component Value Date   LDLCALC 82 02/26/2020   Lab Results  Component Value Date   TRIG 101 02/26/2020   Lab Results  Component Value Date   CHOLHDL 5.3 02/26/2020   Lab Results  Component Value Date   HGBA1C 6.1 (A) 08/08/2020      Assessment & Plan:  Kieon was seen today for hypertension.  Diagnoses and all orders for this visit:  Prediabetes Per ADA guidelines A1c today is 6.1 still classified a diagnosis is prediabetic controlled by diet medication not required at this time 6 months ago A1c was 6.3.   Continue to monitor and follow low carbohydrate  diet -     Glucose (CBG) -     HgB A1c  Need for hepatitis C screening test Per care gaps and health maintenance/preventive care -     Hepatitis C Antibody  Depression, unspecified depression type Discussed with patient the option of going to behavioral health on [address] and they have a walk-in on Fridays.  Also gave him a flyer with information on it.  Patient was adamant about not going anywhere, he would feel more comfortable with PCP managing depression. Prescribed Lexapro 10 mg explained the lowest dose reevaluate in 4 to 6 weeks and can gradually increase if needed.  No suicidal/homicidal ideations -     escitalopram (LEXAPRO) 10 MG tablet; Take 1 tablet (10 mg total) by mouth at bedtime.  Colon cancer screening -     Ambulatory referral to Gastroenterology   Meds ordered this encounter  Medications  . escitalopram (LEXAPRO) 10 MG tablet    Sig: Take 1 tablet (10 mg total) by mouth at bedtime.    Dispense:  90 tablet    Refill:  1    Follow-up: Return in about 6 weeks (around 09/19/2020) for tele medication effectiveness.    [name], NP

## 2020-08-09 LAB — HEPATITIS C ANTIBODY: Hep C Virus Ab: <0.1 s/co ratio (ref 0.0–0.9)

## 2020-08-14 ENCOUNTER — Telehealth: Payer: Self-pay | Admitting: Cardiology

## 2020-08-14 DIAGNOSIS — Z79899 Other long term (current) drug therapy: Secondary | ICD-10-CM

## 2020-08-14 NOTE — Telephone Encounter (Signed)
Continue Lasix 20 mg and KCL 10 meq daily.     Check a BMET in about two weeks.

## 2020-08-14 NOTE — Telephone Encounter (Signed)
Spoke with patient about Dr. Jenene Slicker recommendations for lasix and potassium as well as need for BMET in 2 weeks. Per Dr. Antoine Poche, pt can also have the lipid profile and hepatic function panel already ordered for him drawn at that time as well. Pt states he would prefer to come to our on-site lab as opposed to a [facility], so RN will leave lab requisition form for BMET in lab tech's office. Pt instructed to come fasting and bring requisitions for lipids and liver when he comes in 2 weeks. All pt's questions answered. Pt verbalizes understanding.

## 2020-08-14 NOTE — Telephone Encounter (Signed)
Pt c/o medication issue:  1. Name of Medication:   furosemide (LASIX) 20 MG tablet potassium chloride SA (KLOR-CON) 20 MEQ tablet  2. How are you currently taking this medication (dosage and times per day)? As directed  3. Are you having a reaction (difficulty breathing--STAT)?  No reaction  4. What is your medication issue? No issue -   Patient states that Dr. Antoine Poche put him on the two new medications on Friday 10/22 and that Dr. Antoine Poche wanted a call in about 4 days to discuss how they are working.  He states they seem to be working, they are taking off some weight. He feels like the pills only work for 2-3 hours and then it quits though.

## 2020-08-14 NOTE — Telephone Encounter (Signed)
Patient started lasix + potassium on Sunday He said it takes about 1 hour to start working, and then he goes to bathroom about 3-4 times and then stops working He reports he has lost about 6-7lbs since Sunday   Patient reports breathing is stable Patient reports leg swelling has gone down some  Per 10/22 MD note: LEG SWELLING: I am going to give him Lasix 40 mg daily for 4 days and potassium 20 mEq daily for 4 days.  He will call us back if he has continued swelling and I might continue lower dose 20 Lasix and 10 potassium.  He was previously on diuretics but they were discontinued apparently for insurance reasons.  He might need some baseline diuretic.   Will route to MD as FYI and/or for further recommendations.

## 2020-08-25 ENCOUNTER — Ambulatory Visit (INDEPENDENT_AMBULATORY_CARE_PROVIDER_SITE_OTHER): Payer: Medicaid Other | Admitting: Primary Care

## 2020-08-26 LAB — LIPID PANEL
Chol/HDL Ratio: 3.4 ratio (ref 0.0–5.0)
Cholesterol, Total: 115 mg/dL (ref 100–199)
HDL: 34 mg/dL — ABNORMAL LOW (ref >39)
LDL Chol Calc (NIH): 68 mg/dL (ref 0–99)
Triglycerides: 62 mg/dL (ref 0–149)
VLDL Cholesterol Cal: 13 mg/dL (ref 5–40)

## 2020-08-26 LAB — BASIC METABOLIC PANEL
BUN/Creatinine Ratio: 9 (ref 9–20)
BUN: 9 mg/dL (ref 6–24)
CO2: 24 mmol/L (ref 20–29)
Calcium: 9.9 mg/dL (ref 8.7–10.2)
Chloride: 102 mmol/L (ref 96–106)
Creatinine, Ser: 0.98 mg/dL (ref 0.76–1.27)
GFR calc Af Amer: 101 mL/min/{1.73_m2} (ref >59)
GFR calc non Af Amer: 87 mL/min/{1.73_m2} (ref >59)
Glucose: 122 mg/dL — ABNORMAL HIGH (ref 65–99)
Potassium: 4.5 mmol/L (ref 3.5–5.2)
Sodium: 141 mmol/L (ref 134–144)

## 2020-08-26 LAB — HEPATIC FUNCTION PANEL
ALT: 33 IU/L (ref 0–44)
AST: 21 IU/L (ref 0–40)
Albumin: 4.3 g/dL (ref 3.8–4.9)
Alkaline Phosphatase: 153 IU/L — ABNORMAL HIGH (ref 44–121)
Bilirubin Total: 0.3 mg/dL (ref 0.0–1.2)
Bilirubin, Direct: <0.1 mg/dL (ref 0.00–0.40)
Total Protein: 7.3 g/dL (ref 6.0–8.5)

## 2020-09-01 ENCOUNTER — Other Ambulatory Visit: Payer: Self-pay | Admitting: Cardiology

## 2020-09-01 NOTE — Telephone Encounter (Signed)
Rx has been sent to the pharmacy electronically. ° °

## 2020-09-03 ENCOUNTER — Encounter: Payer: Self-pay | Admitting: Internal Medicine

## 2020-09-19 ENCOUNTER — Telehealth (INDEPENDENT_AMBULATORY_CARE_PROVIDER_SITE_OTHER): Payer: Medicaid Other | Admitting: Primary Care

## 2020-09-19 ENCOUNTER — Encounter (INDEPENDENT_AMBULATORY_CARE_PROVIDER_SITE_OTHER): Payer: Self-pay

## 2020-10-02 ENCOUNTER — Other Ambulatory Visit: Payer: Self-pay | Admitting: Family Medicine

## 2020-10-09 ENCOUNTER — Other Ambulatory Visit (INDEPENDENT_AMBULATORY_CARE_PROVIDER_SITE_OTHER): Payer: Self-pay | Admitting: Primary Care

## 2020-10-14 ENCOUNTER — Other Ambulatory Visit: Payer: Self-pay

## 2020-10-14 ENCOUNTER — Ambulatory Visit (INDEPENDENT_AMBULATORY_CARE_PROVIDER_SITE_OTHER): Payer: Medicaid Other | Admitting: Cardiology

## 2020-10-14 ENCOUNTER — Encounter: Payer: Self-pay | Admitting: Cardiology

## 2020-10-14 DIAGNOSIS — I2510 Atherosclerotic heart disease of native coronary artery without angina pectoris: Secondary | ICD-10-CM | POA: Diagnosis not present

## 2020-10-14 DIAGNOSIS — G4733 Obstructive sleep apnea (adult) (pediatric): Secondary | ICD-10-CM

## 2020-10-14 DIAGNOSIS — I214 Non-ST elevation (NSTEMI) myocardial infarction: Secondary | ICD-10-CM

## 2020-10-14 DIAGNOSIS — Z9861 Coronary angioplasty status: Secondary | ICD-10-CM

## 2020-10-14 DIAGNOSIS — E785 Hyperlipidemia, unspecified: Secondary | ICD-10-CM

## 2020-10-14 NOTE — Assessment & Plan Note (Signed)
Compliant with C-pap 

## 2020-10-14 NOTE — Progress Notes (Signed)
Cardiology Office Note:    Date:  10/14/2020   ID:  Chad Gillespie, DOB 1966/01/23, MRN [id number]  PCP:  [name], NP  Cardiologist:  Rollene Rotunda, MD  Electrophysiologist:  None   Referring MD: [name], NP   No chief complaint on file.   History of Present Illness:    Chad Gillespie is a pleasant 54 y.o. 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  Patient had an NSTEMI in April 2018.  This was treated with 2 site RCA PCI.  In April 2021 catheterization revealed occlusion of the RCA proximal stent.  He did have left to right collaterals.  Plan is for medical therapy.  Echocardiogram in May 2021 showed normal LV function with an EF of 55% and mild LVH with a basilar inferior wall motion abnormality.  Other medical issues include treated hypertension and dyslipidemia.  He also has morbid obesity.  He has sleep apnea and is on CPAP.  He saw Dr. Antoine Poche a few months ago and had mentioned some exertional chest pain.  His isosorbide was increased from 120 mg a day to 180 mg a day.  Patient is here now for follow-up.  He reports improvement in his exertional chest pain on the increased Imdur.  As noted above he is working on getting his disability.  Unfortunately has gained weight almost 20 pounds since his last office visit.  His BMI is 48.  Past Medical History:  Diagnosis Date  . CAD (coronary artery disease)    a. s/p NSTEMI in 01/2017 with DESx2 to RCA b. 01/2020: Cath showing CTO of mid-RCA and 50-60% LCx stenosis --> med management recommended.   Marland Kitchen COPD (chronic obstructive pulmonary disease) (HCC)   . Hyperlipidemia   . MI (myocardial infarction) (HCC)   . Tobacco abuse     Past Surgical History:  Procedure Laterality Date  . CORONARY STENT INTERVENTION N/A 02/04/2017   Procedure: Coronary Stent Intervention;  Surgeon: Peter M Swaziland, MD;  Location: [facility] INVASIVE CV LAB;  Service: Cardiovascular;  Laterality: N/A;   . ESOPHAGOGASTRODUODENOSCOPY (EGD) WITH ESOPHAGEAL DILATION    . LEFT HEART CATH AND CORONARY ANGIOGRAPHY N/A 02/04/2017   Procedure: Left Heart Cath and Coronary Angiography;  Surgeon: Peter M Swaziland, MD;  Location: [facility] INVASIVE CV LAB;  Service: Cardiovascular;  Laterality: N/A;  . LEFT HEART CATH AND CORONARY ANGIOGRAPHY N/A 01/18/2020   Procedure: LEFT HEART CATH AND CORONARY ANGIOGRAPHY;  Surgeon: Yvonne Kendall, MD;  Location: [facility] INVASIVE CV LAB;  Service: Cardiovascular;  Laterality: N/A;    Current Medications: Current Meds  Medication Sig  . aspirin 81 MG chewable tablet Chew 1 tablet (81 mg total) by mouth daily.  Marland Kitchen escitalopram (LEXAPRO) 10 MG tablet Take 1 tablet (10 mg total) by mouth at bedtime.  . furosemide (LASIX) 20 MG tablet TAKE 2 TABLETS(40 MG) BY MOUTH DAILY  . gabapentin (NEURONTIN) 300 MG capsule TAKE 1 CAPSULE(300 MG) BY MOUTH THREE TIMES DAILY  . isosorbide mononitrate (IMDUR) 60 MG 24 hr tablet Take 3 tablets (180 mg total) by mouth daily.  . nicotine (NICOTROL) 10 MG inhaler Inhale 1 Cartridge (1 continuous puffing total) into the lungs as needed for smoking cessation.  . nitroGLYCERIN (NITROSTAT) 0.4 MG SL tablet Place 1 tablet (0.4 mg total) under the tongue every 5 (five) minutes as needed for chest pain.  . potassium chloride SA (KLOR-CON) 20 MEQ tablet TAKE 1 TABLET(20 MEQ) BY  MOUTH DAILY  . PROAIR HFA 108 (90 Base) MCG/ACT inhaler INHALE 2 PUFFS INTO THE LUNGS EVERY 6 HOURS AS NEEDED FOR WHEEZING OR SHORTNESS OF BREATH  . rosuvastatin (CRESTOR) 40 MG tablet Take 1 tablet (40 mg total) by mouth daily.  Marland Kitchen STIOLTO RESPIMAT 2.5-2.5 MCG/ACT AERS INHALE 2 PUFFS INTO THE LUNGS DAILY     Allergies:   Penicillins   Social History   Socioeconomic History  . Marital status: Single    Spouse name: Not on file  . Number of children: Not on file  . Years of education: Not on file  . Highest education level: Not on file  Occupational History  . Not on file   Tobacco Use  . Smoking status: Current Every Day Smoker    Packs/day: 0.50    Types: Cigarettes    Start date: 04/11/1986  . Smokeless tobacco: Never Used  . Tobacco comment: 10 a day  Vaping Use  . Vaping Use: Never used  Substance and Sexual Activity  . Alcohol use: Yes    Comment: occasionally   . Drug use: No  . Sexual activity: Not on file  Other Topics Concern  . Not on file  Social History Narrative  . Not on file   Social Determinants of Health   Financial Resource Strain: Not on file  Food Insecurity: Not on file  Transportation Needs: Not on file  Physical Activity: Not on file  Stress: Not on file  Social Connections: Not on file     Family History: The patient's family history includes Cancer in his father and sister; Diabetes in his father and sister; Heart attack in his father; Hypertension in his mother.  ROS:   Please see the history of present illness.     All other systems reviewed and are negative.  EKGs/Labs/Other Studies Reviewed:    The following studies were reviewed today: Echo 02/26/2020- H/O NSTEMI April 2018  EKG:  EKG is ordered today.  The ekg ordered 08/08/2020 demonstrates NSR, HR 96, inferior Qs  Recent Labs: 02/25/2020: Hemoglobin 15.3; Platelets 306 08/26/2020: ALT 33; BUN 9; Creatinine, Ser 0.98; Potassium 4.5; Sodium 141  Recent Lipid Panel    Component Value Date/Time   CHOL 115 08/26/2020 0818   TRIG 62 08/26/2020 0818   HDL 34 (L) 08/26/2020 0818   CHOLHDL 3.4 08/26/2020 0818   CHOLHDL 5.3 02/26/2020 0544   VLDL 20 02/26/2020 0544   LDLCALC 68 08/26/2020 0818    Physical Exam:    VS:  BP 119/70 (BP Location: Left Arm, Patient Position: Sitting, Cuff Size: Large)   Pulse 88   Ht 6' (1.829 m)   Wt (!) 354 lb 12.8 oz (160.9 kg)   BMI 48.12 kg/m     Wt Readings from Last 3 Encounters:  10/14/20 (!) 354 lb 12.8 oz (160.9 kg)  08/08/20 (!) 338 lb (153.3 kg)  08/08/20 (!) 338 lb 12.8 oz (153.7 kg)     GEN: Obese  Caucasian male, well developed in no acute distress HEENT: Normal NECK: No JVD; No carotid bruits CARDIAC: RRR, no murmurs, rubs, gallops RESPIRATORY:  Clear to auscultation without rales, wheezing or rhonchi  ABDOMEN: Obese, soft, non-distended MUSCULOSKELETAL:  No edema; No deformity  SKIN: Warm and dry NEUROLOGIC:  Alert and oriented x 3 PSYCHIATRIC:  Normal affect   ASSESSMENT:    CAD S/P percutaneous coronary angioplasty RCA PCI with DES x 2 in April 2018- noted to be occluded with L-R collaterals in April 2021-  medical Rx. Imdur increased last office visit with good results.  NSTEMI (non-ST elevated myocardial infarction) (HCC) H/O NSTEMI April 2018  Morbid obesity (HCC) BMI 48- weight up this visit  OSA (obstructive sleep apnea) Compliant with C-pap  Dyslipidemia, goal LDL below 70 LDL 68, Nov 2021  PLAN:    Same Rx- f/u Dr Antoine Poche in 6 months.   Medication Adjustments/Labs and Tests Ordered: Current medicines are reviewed at length with the patient today.  Concerns regarding medicines are outlined above.  No orders of the defined types were placed in this encounter.  No orders of the defined types were placed in this encounter.   Patient Instructions  Medication Instructions:  Continue current medications  *If you need a refill on your cardiac medications before your next appointment, please call your pharmacy*   Lab Work: None Ordered  Testing/Procedures: None Ordered  Follow-Up: At [facility], you and your health needs are our priority.  As part of our continuing mission to provide you with exceptional heart care, we have created designated Provider Care Teams.  These Care Teams include your primary Cardiologist (physician) and Advanced Practice Providers (APPs -  Physician Assistants and Nurse Practitioners) who all work together to provide you with the care you need, when you need it.  We recommend signing up for the patient portal called  "MyChart".  Sign up information is provided on this After Visit Summary.  MyChart is used to connect with patients for Virtual Visits (Telemedicine).  Patients are able to view lab/test results, encounter notes, upcoming appointments, etc.  Non-urgent messages can be sent to your provider as well.   To learn more about what you can do with MyChart, go to [email or web address].    Your next appointment:   6 month(s)  The format for your next appointment:   In Person  Provider:   You may see Rollene Rotunda, MD or one of the following Advanced Practice Providers on your designated Care Team:    Theodore Demark, PA-C  Joni Reining, DNP, ANP        Signed, Corine Shelter, PA-C  10/14/2020 10:03 AM     [facility]

## 2020-10-14 NOTE — Assessment & Plan Note (Signed)
H/O NSTEMI April 2018

## 2020-10-14 NOTE — Assessment & Plan Note (Signed)
LDL 68, Nov 2021

## 2020-10-14 NOTE — Patient Instructions (Signed)
Medication Instructions:  Continue current medications  *If you need a refill on your cardiac medications before your next appointment, please call your pharmacy*   Lab Work: None Ordered   Testing/Procedures: None Ordered   Follow-Up: At [facility], you and your health needs are our priority.  As part of our continuing mission to provide you with exceptional heart care, we have created designated Provider Care Teams.  These Care Teams include your primary Cardiologist (physician) and Advanced Practice Providers (APPs -  Physician Assistants and Nurse Practitioners) who all work together to provide you with the care you need, when you need it.  We recommend signing up for the patient portal called "MyChart".  Sign up information is provided on this After Visit Summary.  MyChart is used to connect with patients for Virtual Visits (Telemedicine).  Patients are able to view lab/test results, encounter notes, upcoming appointments, etc.  Non-urgent messages can be sent to your provider as well.   To learn more about what you can do with MyChart, go to [email or web address].    Your next appointment:   6 month(s)   The format for your next appointment:   In Person  Provider:   You may see James Hochrein, MD or one of the following Advanced Practice Providers on your designated Care Team:    Rhonda Barrett, PA-C  Kathryn Lawrence, DNP, ANP     

## 2020-10-14 NOTE — Assessment & Plan Note (Signed)
BMI 48- weight up this visit

## 2020-10-14 NOTE — Assessment & Plan Note (Signed)
RCA PCI with DES x 2 in April 2018- noted to be occluded with L-R collaterals in April 2021- medical Rx. Imdur increased last office visit with good results.

## 2020-10-20 ENCOUNTER — Ambulatory Visit (AMBULATORY_SURGERY_CENTER): Payer: Self-pay

## 2020-10-20 ENCOUNTER — Other Ambulatory Visit: Payer: Self-pay

## 2020-10-20 VITALS — Ht 72.0 in | Wt 360.0 lb

## 2020-10-20 DIAGNOSIS — Z1211 Encounter for screening for malignant neoplasm of colon: Secondary | ICD-10-CM

## 2020-10-20 DIAGNOSIS — Z01818 Encounter for other preprocedural examination: Secondary | ICD-10-CM

## 2020-10-20 MED ORDER — NA SULFATE-K SULFATE-MG SULF 17.5-3.13-1.6 GM/177ML PO SOLN: 1.0000 | Freq: Once | ORAL | 0 refills | Status: AC

## 2020-10-20 NOTE — Progress Notes (Signed)
No egg or soy allergy known to patient  No issues with past sedation with any surgeries or procedures No intubation problems in the past  No FH of Malignant Hyperthermia No diet pills per patient No home 02 use per patient  No blood thinners per patient  Pt denies issues with constipation  No A fib or A flutter  EMMI video to pt or via MyChart  COVID 19 guidelines implemented in PV today with Pt and RN  Pt is NOT  fully vaccinated  for Covid   Medicaid pt, suprep ordered  Due to the COVID-19 pandemic we are asking patients to follow certain guidelines.  Pt aware of COVID protocols and [facility] guidelines

## 2020-10-30 ENCOUNTER — Other Ambulatory Visit: Payer: Self-pay | Admitting: Internal Medicine

## 2020-10-30 LAB — SARS CORONAVIRUS 2 (TAT 6-24 HRS): SARS Coronavirus 2: NEGATIVE

## 2020-11-03 ENCOUNTER — Encounter: Payer: Medicaid Other | Admitting: Internal Medicine

## 2020-11-10 ENCOUNTER — Ambulatory Visit (INDEPENDENT_AMBULATORY_CARE_PROVIDER_SITE_OTHER): Payer: Medicaid Other | Admitting: Primary Care

## 2020-11-14 ENCOUNTER — Ambulatory Visit (INDEPENDENT_AMBULATORY_CARE_PROVIDER_SITE_OTHER): Payer: Medicaid Other | Admitting: Primary Care

## 2020-11-26 ENCOUNTER — Other Ambulatory Visit: Payer: Self-pay

## 2020-11-26 ENCOUNTER — Telehealth (INDEPENDENT_AMBULATORY_CARE_PROVIDER_SITE_OTHER): Payer: Medicaid Other | Admitting: Primary Care

## 2020-11-26 DIAGNOSIS — Z79899 Other long term (current) drug therapy: Secondary | ICD-10-CM | POA: Diagnosis not present

## 2020-11-26 DIAGNOSIS — F32A Depression, unspecified: Secondary | ICD-10-CM | POA: Diagnosis not present

## 2020-11-26 DIAGNOSIS — Z1211 Encounter for screening for malignant neoplasm of colon: Secondary | ICD-10-CM

## 2020-11-26 MED ORDER — ESCITALOPRAM OXALATE 20 MG PO TABS: 20.0000 mg | ORAL_TABLET | Freq: Every day | ORAL | 1 refills | Status: DC

## 2020-11-26 NOTE — Progress Notes (Signed)
Telephone Note  I connected with Chad Gillespie on 11/26/20 at 10:50 AM EST by telephone and verified that I am speaking with the correct person using two identifiers.  Location: Patient: home  Provider: [name] working from home    I discussed the limitations, risks, security and privacy concerns of performing an evaluation and management service by telephone and the availability of in person appointments. I also discussed with the patient that there may be a patient responsible charge related to this service. The patient expressed understanding and agreed to proceed.   History of Present Illness: Mr. Chad Gillespie is a 55 year old male that continues to have pain bilateral legs on gabapentin some days are better than others . Feelis like it is helping. Currently on Lexapro 10mg  and fines that he can handle situations better when he takes 2 pills at night. Better mood and helps with insomnia. He informed me that after Thanksgiving he quit smoking very proud of him.    Past Medical History:  Diagnosis Date  . CAD (coronary artery disease)    a. s/p NSTEMI in 01/2017 with DESx2 to RCA b. 01/2020: Cath showing CTO of mid-RCA and 50-60% LCx stenosis --> med management recommended.   . CHF (congestive heart failure) (HCC)   . COPD (chronic obstructive pulmonary disease) (HCC)   . Hyperlipidemia   . MI (myocardial infarction) (HCC) 2018  . Sleep apnea    uses cpap  . Stroke (HCC) 2018  . Tobacco abuse    Current Outpatient Medications on File Prior to Visit  Medication Sig Dispense Refill  . aspirin 81 MG chewable tablet Chew 1 tablet (81 mg total) by mouth daily. 30 tablet 11  . furosemide (LASIX) 20 MG tablet TAKE 2 TABLETS(40 MG) BY MOUTH DAILY 60 tablet 11  . gabapentin (NEURONTIN) 300 MG capsule TAKE 1 CAPSULE(300 MG) BY MOUTH THREE TIMES DAILY 270 capsule 0  . isosorbide mononitrate (IMDUR) 60 MG 24 hr tablet Take 3 tablets (180 mg total) by mouth daily. 90 tablet 3   . nicotine (NICOTROL) 10 MG inhaler Inhale 1 Cartridge (1 continuous puffing total) into the lungs as needed for smoking cessation. 42 each 2  . nitroGLYCERIN (NITROSTAT) 0.4 MG SL tablet Place 1 tablet (0.4 mg total) under the tongue every 5 (five) minutes as needed for chest pain. 50 tablet 3  . potassium chloride SA (KLOR-CON) 20 MEQ tablet TAKE 1 TABLET(20 MEQ) BY MOUTH DAILY 30 tablet 11  . PROAIR HFA 108 (90 Base) MCG/ACT inhaler INHALE 2 PUFFS INTO THE LUNGS EVERY 6 HOURS AS NEEDED FOR WHEEZING OR SHORTNESS OF BREATH 8.5 g 2  . rosuvastatin (CRESTOR) 40 MG tablet Take 1 tablet (40 mg total) by mouth daily. 30 tablet 3  . STIOLTO RESPIMAT 2.5-2.5 MCG/ACT AERS INHALE 2 PUFFS INTO THE LUNGS DAILY 4 g 6   No current facility-administered medications on file prior to visit.   Observations/Objective: Pertinent positive and negative listed in HPI  Assessment and Plan: Diagnoses and all orders for this visit:  Medication management -     escitalopram (LEXAPRO) 20 MG tablet; Take 1 tablet (20 mg total) by mouth daily.  Depression, unspecified depression type Increase-escitalopram  to 20mg  patient had already started taking 2 at bed time and notice improvement. Notice improvement in mood and sleeping.  -     escitalopram (LEXAPRO) 20 MG tablet; Take 1 tablet (20 mg total) by mouth daily.    Follow Up Instructions:  I discussed the assessment and treatment plan with the patient. The patient was provided an opportunity to ask questions and all were answered. The patient agreed with the plan and demonstrated an understanding of the instructions.   The patient was advised to call back or seek an in-person evaluation if the symptoms worsen or if the condition fails to improve as anticipated.  I provided 22 minutes of non-face-to-face time during this encounter.   [name], NP

## 2020-12-04 ENCOUNTER — Other Ambulatory Visit: Payer: Self-pay | Admitting: Cardiology

## 2020-12-04 ENCOUNTER — Other Ambulatory Visit: Payer: Self-pay | Admitting: Primary Care

## 2020-12-04 ENCOUNTER — Other Ambulatory Visit (INDEPENDENT_AMBULATORY_CARE_PROVIDER_SITE_OTHER): Payer: Self-pay | Admitting: Primary Care

## 2020-12-09 DIAGNOSIS — G4733 Obstructive sleep apnea (adult) (pediatric): Secondary | ICD-10-CM

## 2020-12-09 IMAGING — DX DG CHEST 2V
3 series · 3 of 3 positions shown · non-contrast
Comparison: 02/03/2017

CLINICAL DATA: Visual difficulty

EXAM:
CHEST - 2 VIEW

[chest pa]
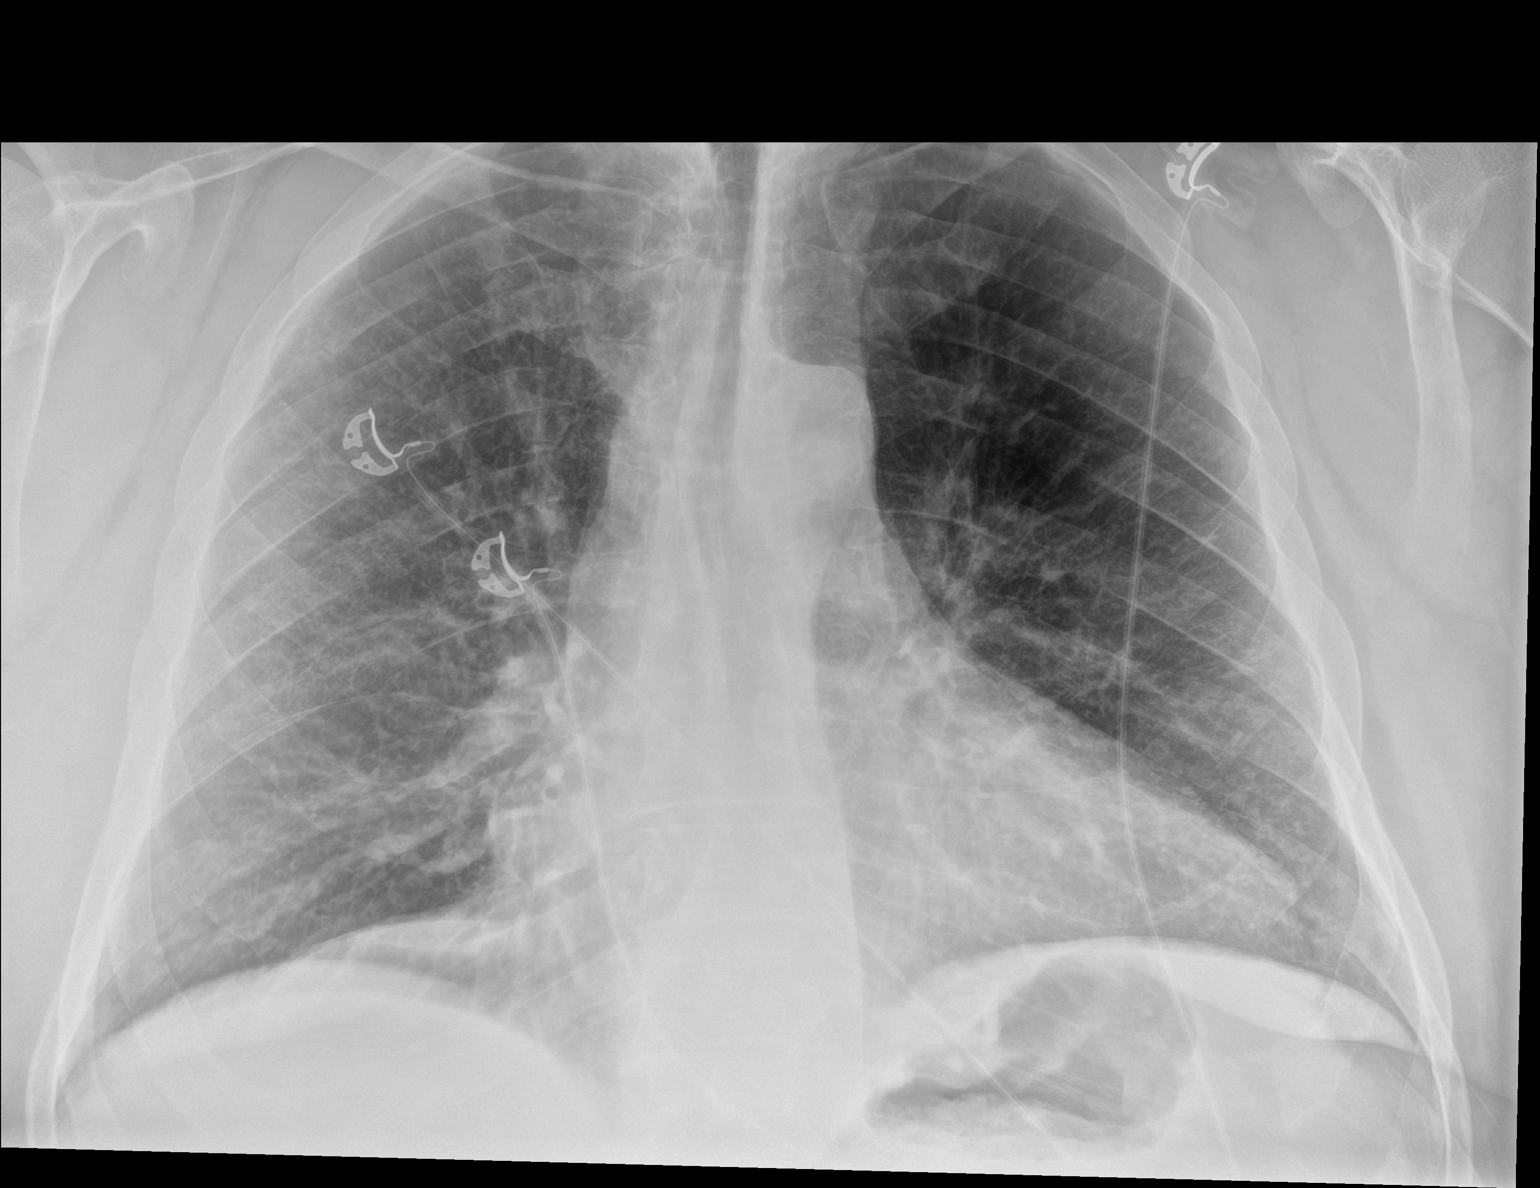

[chest lat (1 of 2)]
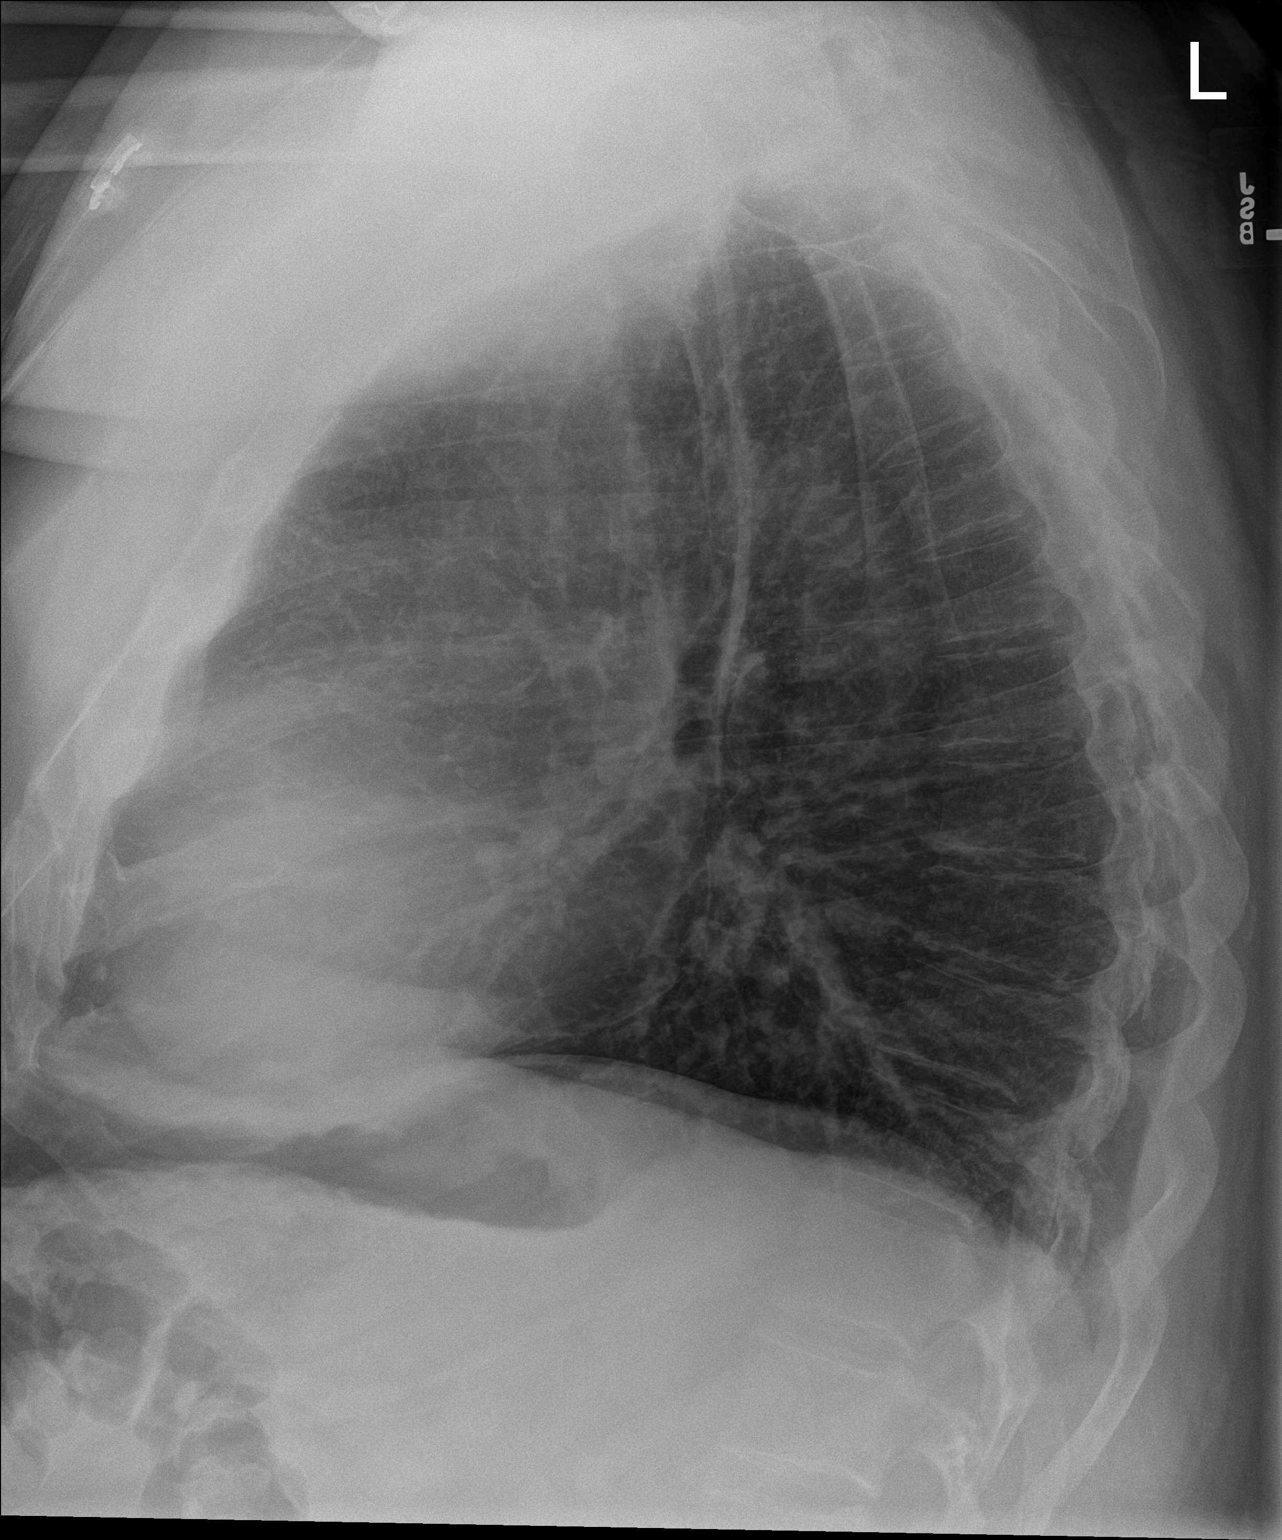

[chest lat (2 of 2)]
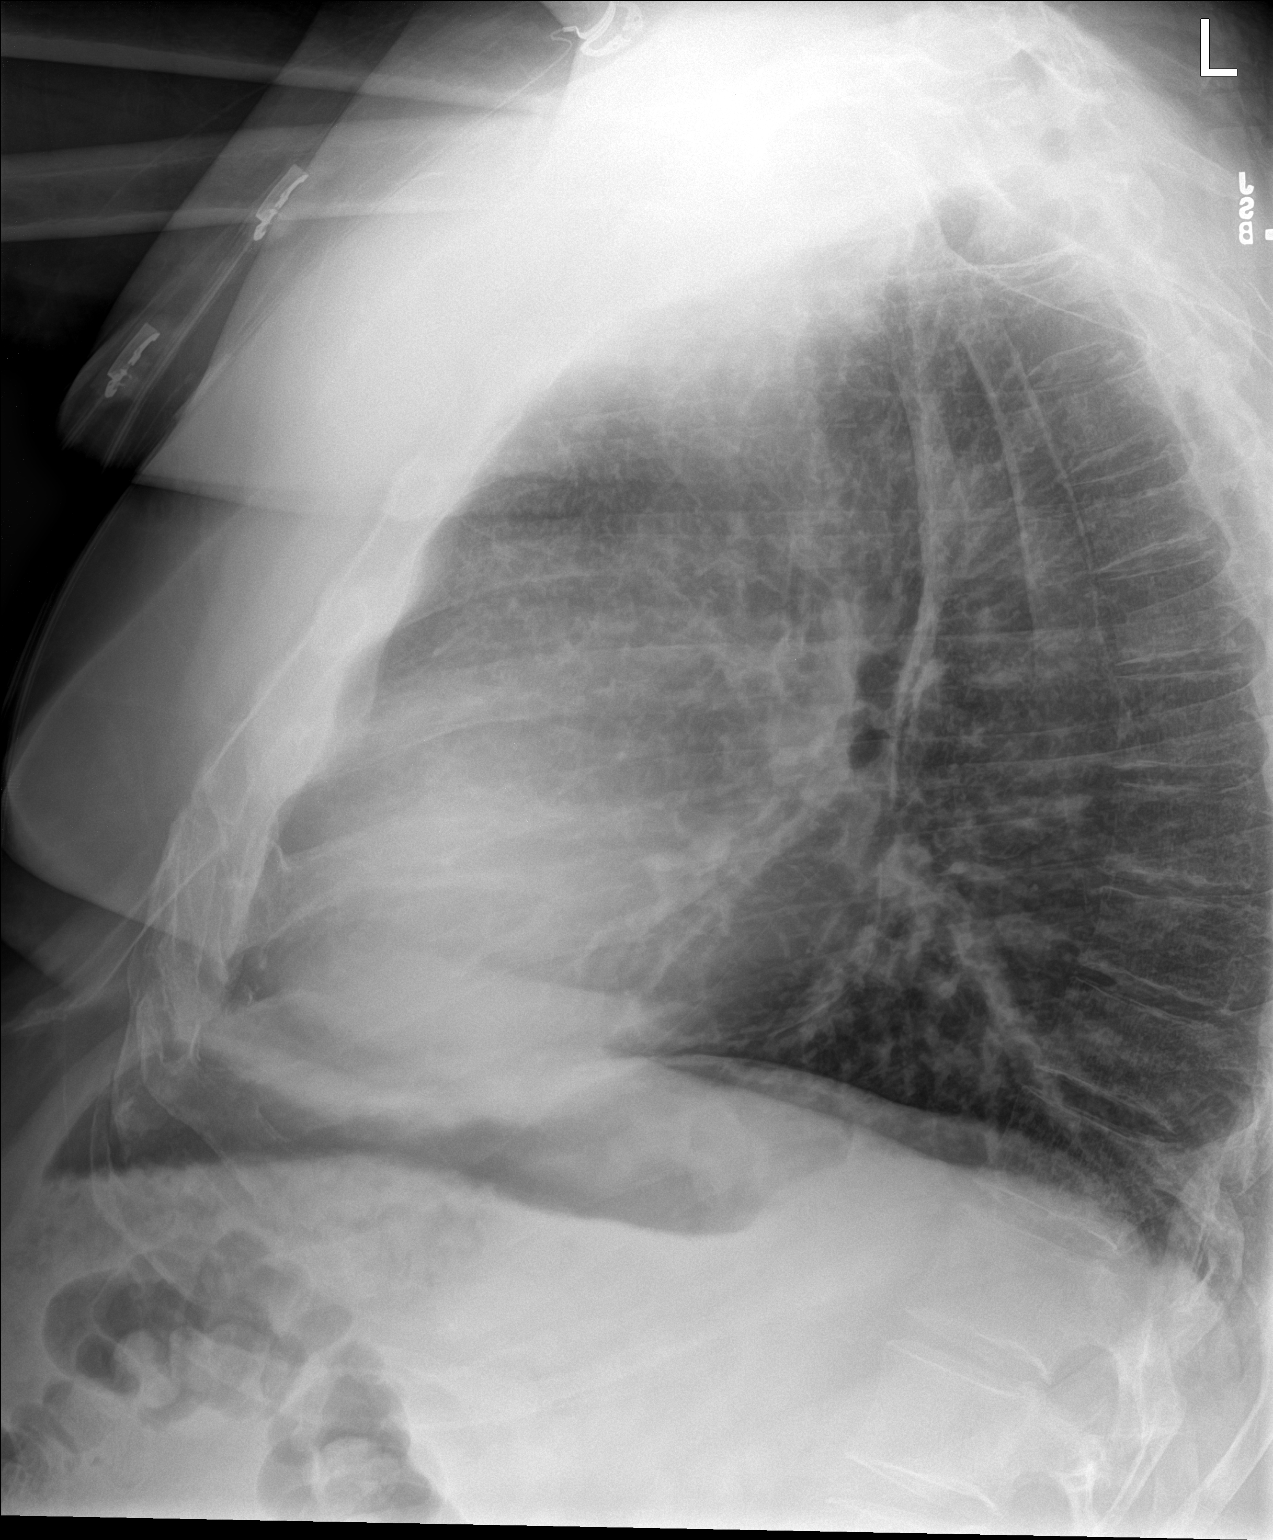

[3 of 3 positions shown; findings below may reference images not displayed]

FINDINGS: Cardiac shadows within normal limits. Very mild vascular congestion
and edema is seen. No focal infiltrate or effusion is noted. No bony
abnormality is seen.
IMPRESSION: Mild vascular congestion and changes of CHF.

## 2020-12-09 NOTE — Telephone Encounter (Signed)
Called and spoke with the pt  He states the last visit when we sent order for new CPAP mask, it was not time for ins to pay for new mask  Now he is due for one and wants order sent for this as well as CPAP hose over to [facility] in [place]  Order sent to [facility]

## 2020-12-29 ENCOUNTER — Telehealth: Payer: Self-pay | Admitting: Internal Medicine

## 2020-12-29 NOTE — Telephone Encounter (Signed)
Good morning Dr. Leone Payor, patient called stating he forgot about being his liquid diet and had a big breakfast this morning.  Procedure was rescheduled from 12/30/20 to 01/29/21.

## 2020-12-29 NOTE — Telephone Encounter (Signed)
Okay, no charge 

## 2020-12-30 ENCOUNTER — Encounter: Payer: Medicaid Other | Admitting: Internal Medicine

## 2021-01-29 ENCOUNTER — Encounter: Payer: Medicaid Other | Admitting: Internal Medicine

## 2021-02-10 ENCOUNTER — Other Ambulatory Visit (INDEPENDENT_AMBULATORY_CARE_PROVIDER_SITE_OTHER): Payer: Self-pay | Admitting: Primary Care

## 2021-02-10 DIAGNOSIS — F32A Depression, unspecified: Secondary | ICD-10-CM

## 2021-03-09 ENCOUNTER — Other Ambulatory Visit (INDEPENDENT_AMBULATORY_CARE_PROVIDER_SITE_OTHER): Payer: Self-pay | Admitting: Primary Care

## 2021-03-09 DIAGNOSIS — F32A Depression, unspecified: Secondary | ICD-10-CM

## 2021-03-09 DIAGNOSIS — Z79899 Other long term (current) drug therapy: Secondary | ICD-10-CM

## 2021-03-09 NOTE — Telephone Encounter (Signed)
Requested Prescriptions  Pending Prescriptions Disp Refills  . gabapentin (NEURONTIN) 300 MG capsule [Pharmacy Med Name: GABAPENTIN 300MG  CAPSULES] 270 capsule 0    Sig: TAKE 1 CAPSULE(300 MG) BY MOUTH THREE TIMES DAILY     Neurology: Anticonvulsants - gabapentin Passed - 03/09/2021  3:56 PM      Passed - Valid encounter within last 12 months    Recent Outpatient Visits          3 months ago Medication management   [facility], NP   7 months ago Prediabetes   [facility], NP   12 months ago Physical deconditioning   [facility], NP   1 year ago Prediabetes   [facility], FNP   1 year ago Morbid obesity (HCC)   [facility] Edwards, Michelle P, NP             . escitalopram (LEXAPRO) 20 MG tablet [Pharmacy Med Name: ESCITALOPRAM 20MG  TABLETS] 90 tablet 1    Sig: TAKE 1 TABLET(20 MG) BY MOUTH DAILY     Psychiatry:  Antidepressants - SSRI Passed - 03/09/2021  3:56 PM      Passed - Valid encounter within last 6 months    Recent Outpatient Visits          3 months ago Medication management   [facility] 03/11/2021, NP   7 months ago Prediabetes   [facility] [name], NP   12 months ago Physical deconditioning   [facility] [name], NP   1 year ago Prediabetes   [facility] [name], FNP   1 year ago Morbid obesity [facility])   [facility], NP

## 2021-03-14 ENCOUNTER — Other Ambulatory Visit: Payer: Self-pay | Admitting: Cardiology

## 2021-03-31 ENCOUNTER — Encounter: Payer: Medicaid Other | Admitting: Internal Medicine

## 2021-05-26 ENCOUNTER — Other Ambulatory Visit: Payer: Self-pay | Admitting: Primary Care

## 2021-05-26 ENCOUNTER — Other Ambulatory Visit: Payer: Self-pay | Admitting: Pulmonary Disease

## 2021-05-26 NOTE — Telephone Encounter (Signed)
Requested medication (s) are due for refill today: no  Requested medication (s) are on the active medication list:yes   Last refill:  03/09/2021  Future visit scheduled: no  Notes to clinic:  Failed protocol:  One inhaler should last at least one month. If the patient is requesting refills earlier, contact the patient to check for uncontrolled symptoms   Requested Prescriptions  Pending Prescriptions Disp Refills   PROAIR HFA 108 (90 Base) MCG/ACT inhaler [Pharmacy Med Name: PROAIR HFA ORAL INH (200  PFS) 8.5G] 8.5 g 2    Sig: INHALE 2 PUFFS INTO THE LUNGS EVERY 6 HOURS AS NEEDED FOR WHEEZING OR SHORTNESS OF BREATH      Pulmonology:  Beta Agonists Failed - 05/26/2021 10:02 AM      Failed - One inhaler should last at least one month. If the patient is requesting refills earlier, contact the patient to check for uncontrolled symptoms.      Passed - Valid encounter within last 12 months    Recent Outpatient Visits           6 months ago Medication management   [facility] [name], NP   9 months ago Prediabetes   [facility] [name], NP   1 year ago Physical deconditioning   [facility] [name], NP   1 year ago Prediabetes   [facility] Bing Neighbors, FNP   1 year ago Morbid obesity [facility])   [facility] [name], NP

## 2021-06-13 ENCOUNTER — Other Ambulatory Visit: Payer: Self-pay | Admitting: Primary Care

## 2021-06-13 NOTE — Telephone Encounter (Signed)
Called pt to check on his use of the Pro-Air. Pt stated the inhaler still has medicine in it, but that the pharmacy must be sending it out for a refill. Advised pt that it was a 2 weeks early to refill but wanted to check on him to make sure his breathing was not worse.   Refusing this med

## 2021-08-01 ENCOUNTER — Other Ambulatory Visit (INDEPENDENT_AMBULATORY_CARE_PROVIDER_SITE_OTHER): Payer: Self-pay | Admitting: Primary Care

## 2021-08-01 NOTE — Telephone Encounter (Signed)
Requested Prescriptions  Pending Prescriptions Disp Refills  . PROAIR HFA 108 (90 Base) MCG/ACT inhaler [Pharmacy Med Name: PROAIR HFA ORAL INH (200  PFS) 8.5G] 8.5 g 0    Sig: INHALE 2 PUFFS BY MOUTH EVERY 6 HOURS AS NEEDED FOR WHEEZING     Pulmonology:  Beta Agonists Failed - 08/01/2021  9:33 AM      Failed - One inhaler should last at least one month. If the patient is requesting refills earlier, contact the patient to check for uncontrolled symptoms.      Passed - Valid encounter within last 12 months    Recent Outpatient Visits          8 months ago Medication management   [facility] [name], NP   11 months ago Prediabetes   [facility] [name], NP   1 year ago Physical deconditioning   [facility] [name], NP   1 year ago Prediabetes   [facility] Bing Neighbors, FNP   1 year ago Morbid obesity [facility])   [facility] [name], NP

## 2021-09-01 ENCOUNTER — Other Ambulatory Visit (INDEPENDENT_AMBULATORY_CARE_PROVIDER_SITE_OTHER): Payer: Self-pay | Admitting: Primary Care

## 2021-09-01 ENCOUNTER — Other Ambulatory Visit: Payer: Self-pay | Admitting: Cardiology

## 2021-09-01 NOTE — Telephone Encounter (Signed)
Pt last seen 12/15/2020 Requested Prescriptions  Pending Prescriptions Disp Refills  . VENTOLIN HFA 108 (90 Base) MCG/ACT inhaler [Pharmacy Med Name: VENTOLIN HFA INH W/DOS CTR 200PUFFS] 18 g 0    Sig: INHALE 2 PUFFS BY MOUTH EVERY 6 HOURS AS NEEDED FOR WHEEZING     Pulmonology:  Beta Agonists Failed - 09/01/2021  5:16 PM      Failed - One inhaler should last at least one month. If the patient is requesting refills earlier, contact the patient to check for uncontrolled symptoms.      Passed - Valid encounter within last 12 months    Recent Outpatient Visits          9 months ago Medication management   [facility] [name], NP   1 year ago Prediabetes   [facility] [name], NP   1 year ago Physical deconditioning   [facility] [name], NP   1 year ago Prediabetes   [facility] Bing Neighbors, FNP   1 year ago Morbid obesity [facility])   [facility] [name], NP

## 2021-09-02 ENCOUNTER — Other Ambulatory Visit: Payer: Self-pay | Admitting: Cardiology

## 2021-10-01 ENCOUNTER — Other Ambulatory Visit: Payer: Self-pay | Admitting: Cardiology

## 2021-10-01 ENCOUNTER — Other Ambulatory Visit (INDEPENDENT_AMBULATORY_CARE_PROVIDER_SITE_OTHER): Payer: Self-pay | Admitting: Nurse Practitioner

## 2021-10-02 ENCOUNTER — Other Ambulatory Visit: Payer: Self-pay | Admitting: Cardiology

## 2021-10-04 ENCOUNTER — Other Ambulatory Visit: Payer: Self-pay | Admitting: Cardiology

## 2021-10-05 ENCOUNTER — Other Ambulatory Visit: Payer: Self-pay | Admitting: Cardiology

## 2021-11-23 ENCOUNTER — Telehealth: Payer: Self-pay | Admitting: Cardiology

## 2021-11-23 NOTE — Telephone Encounter (Signed)
Patient states he has moved and has new doctors in [place] now.

## 2022-02-10 ENCOUNTER — Other Ambulatory Visit: Payer: Self-pay | Admitting: Cardiology

## 2022-03-12 ENCOUNTER — Other Ambulatory Visit: Payer: Self-pay | Admitting: Cardiology

## 2022-03-22 ENCOUNTER — Other Ambulatory Visit: Payer: Self-pay | Admitting: Cardiology

## 2022-05-12 ENCOUNTER — Other Ambulatory Visit: Payer: Self-pay | Admitting: Cardiology

## 2022-12-18 ENCOUNTER — Other Ambulatory Visit: Payer: Self-pay | Admitting: Cardiology

## 2022-12-20 ENCOUNTER — Other Ambulatory Visit: Payer: Self-pay | Admitting: Cardiology

## 2023-01-31 ENCOUNTER — Other Ambulatory Visit: Payer: Self-pay | Admitting: Cardiology

## 2023-02-01 ENCOUNTER — Other Ambulatory Visit: Payer: Self-pay | Admitting: Cardiology

## 2023-12-11 ENCOUNTER — Other Ambulatory Visit: Payer: Self-pay | Admitting: Cardiology

## 2023-12-12 NOTE — Telephone Encounter (Signed)
 Patient hasn't been seen since 10-14-20 and is no longer an established patient. How would Dr. Antoine Poche like to proceed with refilling this medication?

## 2023-12-16 NOTE — Telephone Encounter (Signed)
 Patient identification verified by 2 forms. Chad Rail, RN    Called and spoke to patient  Patient states:   -He is no longer patient of Dr. Antoine Poche   -he is followed by [facility] in [place]  Advised patient to contact his new cardiology office for refill request  Patient verbalized understanding, no questions at this time
